# Patient Record
Sex: Female | Born: 1965 | Race: Black or African American | Hispanic: No | Marital: Single | Smoking: Former smoker
Health system: Southern US, Community
[De-identification: ages and names within clinical notes are randomized; demographics above are authoritative.]

## PROBLEM LIST (undated history)

## (undated) DIAGNOSIS — G4733 Obstructive sleep apnea (adult) (pediatric): Secondary | ICD-10-CM

## (undated) DIAGNOSIS — I517 Cardiomegaly: Secondary | ICD-10-CM

## (undated) DIAGNOSIS — M797 Fibromyalgia: Secondary | ICD-10-CM

## (undated) DIAGNOSIS — R0600 Dyspnea, unspecified: Secondary | ICD-10-CM

## (undated) DIAGNOSIS — I5032 Chronic diastolic (congestive) heart failure: Secondary | ICD-10-CM

## (undated) DIAGNOSIS — I10 Essential (primary) hypertension: Secondary | ICD-10-CM

## (undated) DIAGNOSIS — E119 Type 2 diabetes mellitus without complications: Secondary | ICD-10-CM

## (undated) DIAGNOSIS — F319 Bipolar disorder, unspecified: Secondary | ICD-10-CM

## (undated) DIAGNOSIS — J449 Chronic obstructive pulmonary disease, unspecified: Secondary | ICD-10-CM

## (undated) DIAGNOSIS — F419 Anxiety disorder, unspecified: Secondary | ICD-10-CM

## (undated) DIAGNOSIS — F32A Depression, unspecified: Secondary | ICD-10-CM

## (undated) DIAGNOSIS — N189 Chronic kidney disease, unspecified: Secondary | ICD-10-CM

## (undated) HISTORY — DX: Obstructive sleep apnea (adult) (pediatric): G47.33

## (undated) HISTORY — PX: NO PAST SURGERIES: SHX2092

## (undated) HISTORY — DX: Morbid (severe) obesity due to excess calories: E66.01

## (undated) HISTORY — DX: Chronic diastolic (congestive) heart failure: I50.32

---

## 1998-01-26 ENCOUNTER — Emergency Department (HOSPITAL_COMMUNITY): Admission: EM | Admit: 1998-01-26 | Discharge: 1998-01-26 | Payer: Self-pay | Admitting: Emergency Medicine

## 1998-01-26 ENCOUNTER — Encounter: Payer: Self-pay | Admitting: Emergency Medicine

## 2000-01-29 ENCOUNTER — Emergency Department (HOSPITAL_COMMUNITY): Admission: EM | Admit: 2000-01-29 | Discharge: 2000-01-29 | Payer: Self-pay

## 2001-07-25 ENCOUNTER — Ambulatory Visit (HOSPITAL_COMMUNITY): Admission: RE | Admit: 2001-07-25 | Discharge: 2001-07-25 | Payer: Self-pay | Admitting: Internal Medicine

## 2001-07-25 ENCOUNTER — Encounter: Payer: Self-pay | Admitting: Internal Medicine

## 2001-12-12 ENCOUNTER — Encounter: Payer: Self-pay | Admitting: Internal Medicine

## 2001-12-12 ENCOUNTER — Encounter: Admission: RE | Admit: 2001-12-12 | Discharge: 2001-12-12 | Payer: Self-pay | Admitting: Internal Medicine

## 2002-05-06 ENCOUNTER — Encounter: Payer: Self-pay | Admitting: Internal Medicine

## 2002-05-06 ENCOUNTER — Encounter: Admission: RE | Admit: 2002-05-06 | Discharge: 2002-05-06 | Payer: Self-pay | Admitting: Internal Medicine

## 2002-08-29 ENCOUNTER — Emergency Department (HOSPITAL_COMMUNITY): Admission: EM | Admit: 2002-08-29 | Discharge: 2002-08-29 | Payer: Self-pay | Admitting: Emergency Medicine

## 2002-10-28 ENCOUNTER — Encounter: Admission: RE | Admit: 2002-10-28 | Discharge: 2002-11-17 | Payer: Self-pay | Admitting: Internal Medicine

## 2003-03-26 ENCOUNTER — Encounter: Admission: RE | Admit: 2003-03-26 | Discharge: 2003-03-26 | Payer: Self-pay | Admitting: Internal Medicine

## 2003-06-05 ENCOUNTER — Emergency Department (HOSPITAL_COMMUNITY): Admission: EM | Admit: 2003-06-05 | Discharge: 2003-06-05 | Payer: Self-pay | Admitting: *Deleted

## 2003-07-23 ENCOUNTER — Ambulatory Visit (HOSPITAL_BASED_OUTPATIENT_CLINIC_OR_DEPARTMENT_OTHER): Admission: RE | Admit: 2003-07-23 | Discharge: 2003-07-23 | Payer: Self-pay | Admitting: Internal Medicine

## 2003-09-16 ENCOUNTER — Emergency Department (HOSPITAL_COMMUNITY): Admission: EM | Admit: 2003-09-16 | Discharge: 2003-09-16 | Payer: Self-pay | Admitting: Emergency Medicine

## 2005-01-29 ENCOUNTER — Emergency Department (HOSPITAL_COMMUNITY): Admission: EM | Admit: 2005-01-29 | Discharge: 2005-01-30 | Payer: Self-pay | Admitting: Emergency Medicine

## 2005-08-21 ENCOUNTER — Emergency Department (HOSPITAL_COMMUNITY): Admission: EM | Admit: 2005-08-21 | Discharge: 2005-08-21 | Payer: Self-pay | Admitting: Emergency Medicine

## 2005-10-09 ENCOUNTER — Emergency Department (HOSPITAL_COMMUNITY): Admission: EM | Admit: 2005-10-09 | Discharge: 2005-10-09 | Payer: Self-pay | Admitting: *Deleted

## 2005-11-27 ENCOUNTER — Inpatient Hospital Stay (HOSPITAL_COMMUNITY): Admission: AD | Admit: 2005-11-27 | Discharge: 2005-11-27 | Payer: Self-pay | Admitting: Obstetrics & Gynecology

## 2005-12-19 ENCOUNTER — Ambulatory Visit: Payer: Self-pay | Admitting: Nurse Practitioner

## 2005-12-22 ENCOUNTER — Ambulatory Visit: Payer: Self-pay | Admitting: *Deleted

## 2006-02-20 ENCOUNTER — Ambulatory Visit (HOSPITAL_COMMUNITY): Admission: RE | Admit: 2006-02-20 | Discharge: 2006-02-20 | Payer: Self-pay | Admitting: Family Medicine

## 2006-03-30 ENCOUNTER — Emergency Department (HOSPITAL_COMMUNITY): Admission: EM | Admit: 2006-03-30 | Discharge: 2006-03-30 | Payer: Self-pay | Admitting: Emergency Medicine

## 2006-04-26 ENCOUNTER — Emergency Department (HOSPITAL_COMMUNITY): Admission: EM | Admit: 2006-04-26 | Discharge: 2006-04-26 | Payer: Self-pay | Admitting: Emergency Medicine

## 2006-05-14 ENCOUNTER — Ambulatory Visit: Payer: Self-pay | Admitting: Nurse Practitioner

## 2006-05-24 ENCOUNTER — Ambulatory Visit: Payer: Self-pay | Admitting: Nurse Practitioner

## 2006-05-25 ENCOUNTER — Ambulatory Visit (HOSPITAL_COMMUNITY): Admission: RE | Admit: 2006-05-25 | Discharge: 2006-05-25 | Payer: Self-pay | Admitting: Nurse Practitioner

## 2006-05-25 ENCOUNTER — Ambulatory Visit: Payer: Self-pay | Admitting: Vascular Surgery

## 2006-05-25 ENCOUNTER — Encounter: Payer: Self-pay | Admitting: Vascular Surgery

## 2006-07-19 ENCOUNTER — Ambulatory Visit: Payer: Self-pay | Admitting: Internal Medicine

## 2006-08-07 ENCOUNTER — Ambulatory Visit (HOSPITAL_BASED_OUTPATIENT_CLINIC_OR_DEPARTMENT_OTHER): Admission: RE | Admit: 2006-08-07 | Discharge: 2006-08-07 | Payer: Self-pay | Admitting: Family Medicine

## 2006-08-12 ENCOUNTER — Ambulatory Visit: Payer: Self-pay | Admitting: Internal Medicine

## 2006-10-24 ENCOUNTER — Encounter (INDEPENDENT_AMBULATORY_CARE_PROVIDER_SITE_OTHER): Payer: Self-pay | Admitting: *Deleted

## 2006-11-13 ENCOUNTER — Ambulatory Visit: Payer: Self-pay | Admitting: Internal Medicine

## 2006-11-13 ENCOUNTER — Encounter (INDEPENDENT_AMBULATORY_CARE_PROVIDER_SITE_OTHER): Payer: Self-pay | Admitting: Nurse Practitioner

## 2006-11-13 LAB — CONVERTED CEMR LAB
ALT: 12 units/L (ref 0–35)
AST: 16 units/L (ref 0–37)
Albumin: 4 g/dL (ref 3.5–5.2)
Alkaline Phosphatase: 60 units/L (ref 39–117)
BUN: 10 mg/dL (ref 6–23)
CO2: 25 meq/L (ref 19–32)
Calcium: 9.1 mg/dL (ref 8.4–10.5)
Creatinine, Ser: 0.82 mg/dL (ref 0.40–1.20)
Glucose, Bld: 94 mg/dL (ref 70–99)
HCT: 40.8 % (ref 36.0–46.0)
Lymphocytes Relative: 36 % (ref 12–46)
MCV: 79.1 fL (ref 78.0–100.0)
Neutro Abs: 4.7 10*3/uL (ref 1.7–7.7)
Neutrophils Relative %: 56 % (ref 43–77)
RBC: 5.16 M/uL — ABNORMAL HIGH (ref 3.87–5.11)
TSH: 2.819 microintl units/mL (ref 0.350–5.50)
Total Bilirubin: 0.5 mg/dL (ref 0.3–1.2)
Total Protein: 7.4 g/dL (ref 6.0–8.3)
WBC: 8.4 10*3/uL (ref 4.0–10.5)

## 2006-11-19 ENCOUNTER — Encounter: Admission: RE | Admit: 2006-11-19 | Discharge: 2006-11-19 | Payer: Self-pay | Admitting: Nurse Practitioner

## 2006-12-31 ENCOUNTER — Encounter (INDEPENDENT_AMBULATORY_CARE_PROVIDER_SITE_OTHER): Payer: Self-pay | Admitting: Emergency Medicine

## 2006-12-31 ENCOUNTER — Emergency Department (HOSPITAL_COMMUNITY): Admission: EM | Admit: 2006-12-31 | Discharge: 2006-12-31 | Payer: Self-pay | Admitting: Emergency Medicine

## 2006-12-31 ENCOUNTER — Ambulatory Visit: Payer: Self-pay | Admitting: *Deleted

## 2007-01-07 ENCOUNTER — Ambulatory Visit: Payer: Self-pay | Admitting: Internal Medicine

## 2007-01-24 ENCOUNTER — Ambulatory Visit: Payer: Self-pay | Admitting: Internal Medicine

## 2007-01-28 ENCOUNTER — Encounter: Admission: RE | Admit: 2007-01-28 | Discharge: 2007-01-28 | Payer: Self-pay | Admitting: Internal Medicine

## 2007-02-15 ENCOUNTER — Ambulatory Visit: Payer: Self-pay | Admitting: Family Medicine

## 2007-03-04 ENCOUNTER — Ambulatory Visit (HOSPITAL_COMMUNITY): Admission: RE | Admit: 2007-03-04 | Discharge: 2007-03-04 | Payer: Self-pay | Admitting: Family Medicine

## 2007-06-18 ENCOUNTER — Ambulatory Visit: Payer: Self-pay | Admitting: Internal Medicine

## 2007-07-24 ENCOUNTER — Emergency Department (HOSPITAL_COMMUNITY): Admission: EM | Admit: 2007-07-24 | Discharge: 2007-07-24 | Payer: Self-pay | Admitting: Emergency Medicine

## 2007-08-19 ENCOUNTER — Ambulatory Visit: Payer: Self-pay | Admitting: Internal Medicine

## 2007-10-30 ENCOUNTER — Ambulatory Visit: Payer: Self-pay | Admitting: Internal Medicine

## 2008-03-04 ENCOUNTER — Ambulatory Visit: Payer: Self-pay | Admitting: Internal Medicine

## 2008-03-04 LAB — CONVERTED CEMR LAB
Chloride: 99 meq/L (ref 96–112)
Cholesterol: 152 mg/dL (ref 0–200)
Potassium: 3.7 meq/L (ref 3.5–5.3)
Sodium: 140 meq/L (ref 135–145)
TSH: 1.862 microintl units/mL (ref 0.350–4.50)

## 2008-03-20 ENCOUNTER — Ambulatory Visit (HOSPITAL_COMMUNITY): Admission: RE | Admit: 2008-03-20 | Discharge: 2008-03-20 | Payer: Self-pay | Admitting: Internal Medicine

## 2008-05-07 ENCOUNTER — Ambulatory Visit: Payer: Self-pay | Admitting: Internal Medicine

## 2008-06-18 ENCOUNTER — Ambulatory Visit: Payer: Self-pay | Admitting: Internal Medicine

## 2008-06-18 ENCOUNTER — Ambulatory Visit: Payer: Self-pay | Admitting: *Deleted

## 2008-06-18 ENCOUNTER — Encounter: Payer: Self-pay | Admitting: Internal Medicine

## 2008-08-18 ENCOUNTER — Ambulatory Visit: Payer: Self-pay | Admitting: Internal Medicine

## 2008-09-14 ENCOUNTER — Telehealth (INDEPENDENT_AMBULATORY_CARE_PROVIDER_SITE_OTHER): Payer: Self-pay | Admitting: *Deleted

## 2008-09-14 ENCOUNTER — Ambulatory Visit: Payer: Self-pay | Admitting: Internal Medicine

## 2008-09-14 LAB — CONVERTED CEMR LAB
Chlamydia, Swab/Urine, PCR: NEGATIVE
GC Probe Amp, Urine: NEGATIVE

## 2008-09-24 ENCOUNTER — Telehealth (INDEPENDENT_AMBULATORY_CARE_PROVIDER_SITE_OTHER): Payer: Self-pay | Admitting: *Deleted

## 2008-11-09 ENCOUNTER — Ambulatory Visit: Payer: Self-pay | Admitting: Internal Medicine

## 2008-11-09 LAB — CONVERTED CEMR LAB
Chloride: 102 meq/L (ref 96–112)
Glucose, Bld: 100 mg/dL — ABNORMAL HIGH (ref 70–99)

## 2008-11-19 ENCOUNTER — Ambulatory Visit: Payer: Self-pay | Admitting: Internal Medicine

## 2008-11-20 ENCOUNTER — Encounter (INDEPENDENT_AMBULATORY_CARE_PROVIDER_SITE_OTHER): Payer: Self-pay | Admitting: Internal Medicine

## 2008-12-07 ENCOUNTER — Ambulatory Visit: Payer: Self-pay | Admitting: Internal Medicine

## 2009-01-26 ENCOUNTER — Ambulatory Visit: Payer: Self-pay | Admitting: Internal Medicine

## 2009-02-09 ENCOUNTER — Ambulatory Visit: Payer: Self-pay | Admitting: Internal Medicine

## 2009-03-03 ENCOUNTER — Inpatient Hospital Stay (HOSPITAL_COMMUNITY): Admission: AD | Admit: 2009-03-03 | Discharge: 2009-03-03 | Payer: Self-pay | Admitting: Obstetrics & Gynecology

## 2009-05-03 ENCOUNTER — Emergency Department (HOSPITAL_COMMUNITY): Admission: EM | Admit: 2009-05-03 | Discharge: 2009-05-03 | Payer: Self-pay | Admitting: Emergency Medicine

## 2009-06-02 ENCOUNTER — Emergency Department (HOSPITAL_COMMUNITY): Admission: EM | Admit: 2009-06-02 | Discharge: 2009-06-02 | Payer: Self-pay | Admitting: Emergency Medicine

## 2009-09-02 ENCOUNTER — Ambulatory Visit: Payer: Self-pay | Admitting: Internal Medicine

## 2009-09-28 ENCOUNTER — Ambulatory Visit: Payer: Self-pay | Admitting: Internal Medicine

## 2009-12-01 ENCOUNTER — Encounter (INDEPENDENT_AMBULATORY_CARE_PROVIDER_SITE_OTHER): Payer: Self-pay | Admitting: Internal Medicine

## 2009-12-01 ENCOUNTER — Ambulatory Visit: Payer: Self-pay | Admitting: Internal Medicine

## 2009-12-01 LAB — CONVERTED CEMR LAB
Chlamydia, Swab/Urine, PCR: NEGATIVE
GC Probe Amp, Urine: NEGATIVE
HIV: NONREACTIVE

## 2009-12-29 ENCOUNTER — Emergency Department (HOSPITAL_COMMUNITY): Admission: EM | Admit: 2009-12-29 | Discharge: 2009-12-29 | Payer: Self-pay | Admitting: Emergency Medicine

## 2010-02-27 ENCOUNTER — Encounter: Payer: Self-pay | Admitting: Internal Medicine

## 2010-04-24 LAB — CBC
HCT: 36 % (ref 36.0–46.0)
Hemoglobin: 11.4 g/dL — ABNORMAL LOW (ref 12.0–15.0)
MCV: 80.7 fL (ref 78.0–100.0)
Platelets: 343 10*3/uL (ref 150–400)
RBC: 4.46 MIL/uL (ref 3.87–5.11)
WBC: 6.7 10*3/uL (ref 4.0–10.5)

## 2010-04-24 LAB — WET PREP, GENITAL: Clue Cells Wet Prep HPF POC: NONE SEEN

## 2010-04-24 LAB — URINALYSIS, ROUTINE W REFLEX MICROSCOPIC
Bilirubin Urine: NEGATIVE
Nitrite: NEGATIVE
Urobilinogen, UA: 0.2 mg/dL (ref 0.0–1.0)

## 2010-04-24 LAB — URINE MICROSCOPIC-ADD ON

## 2010-04-24 LAB — POCT PREGNANCY, URINE: Preg Test, Ur: NEGATIVE

## 2010-06-21 NOTE — Procedures (Signed)
NAME:  Jenna Chandler, Jenna Chandler NO.:  [id number]   MEDICAL RECORD NO.:  [id number]          PATIENT TYPE:  OUT   LOCATION:  SLEEP CENTER                 FACILITY:  [facility]   PHYSICIAN:  Clinton D. Maple Hudson, MD, FCCP, FACPDATE OF BIRTH:  24-Mar-1963   DATE OF STUDY:  08/07/2006                            NOCTURNAL POLYSOMNOGRAM   REFERRING PHYSICIAN:  Maurice March, M.D.   INDICATION FOR STUDY:  Hypersomnia with sleep apnea.   EPWORTH SLEEPINESS SCORE:  17/24, BMI 54.8, weight 330 pounds.   MEDICATIONS:  Home medications are listed and reviewed.   A previous study on July 23, 2003, had recorded an AHI of 71 per hour.  Split protocol CPAP titration then to 21 CWP gave an AHI of 1.3 per  hour.   SLEEP ARCHITECTURE:  Total sleep time 333 minutes with sleep efficiency  83%.  Stage I was 8%, stage 2 67%, stages III and IV absent, REM 24% of  total sleep time.  Sleep latency 28 minutes, REM latency 126 minutes,  awake after sleep onset 43 minutes, arousal index 7.6.  Bedtime  medications included Rozerem, carisoprodol and Tylenol, all taken at  9:30 p.m.   RESPIRATORY DATA:  Split study protocol.  Apnea-hypopnea index (AHI,  RDI) 73.9 obstructive events per hour, indicating severe obstructive  sleep apnea/hypopnea syndrome before CPAP.  There were 71 obstructive  apneas and 83 hypopneas before CPAP.  Events were not positional,  although most sleep time was spent on her left side.  REM AHI 20.9 per  hour.  CPAP was titrated to 18 CWP, AHI 0 per hour.  A large Respironics  Comfort full mask was used with a heated humidifier.   OXYGEN DATA:  Loud snoring before CPAP with oxygen desaturation to a  nadir of 66%.  After CPAP control, saturation held 93-95% on room air.   CARDIAC DATA:  Normal sinus rhythm.   MOVEMENT-PARASOMNIA:  Occasional limb jerk, insignificant effect on  sleep.  Bathroom x1.   IMPRESSIONS-RECOMMENDATIONS:  1. Severe obstructive sleep apnea/hypopnea  syndrome, apnea-hypopnea      index 73.9 per hour with nonpositional events, loud snoring and      oxygen desaturation to a nadir of 66%.  2. Successful continuous positive airway pressure titration to 18      centimeters of water pressure, apnea-hypopnea index 0 per hour.  A      large Respironics Comfort full mask was used with heated      humidifier.      Clinton D. Maple Hudson, MD, [facility], FACP  Diplomate, Biomedical engineer of Sleep Medicine  Electronically Signed     CDY/MEDQ  D:  08/12/2006 13:34:17  T:  08/12/2006 04:54:09  Job:  [id number]

## 2010-08-31 ENCOUNTER — Emergency Department (HOSPITAL_COMMUNITY)
Admission: EM | Admit: 2010-08-31 | Discharge: 2010-08-31 | Disposition: A | Discharge status: Home / Self Care | Payer: Self-pay | Attending: Emergency Medicine | Admitting: Emergency Medicine

## 2010-08-31 ENCOUNTER — Emergency Department (HOSPITAL_COMMUNITY): Payer: Self-pay

## 2010-08-31 DIAGNOSIS — R079 Chest pain, unspecified: Secondary | ICD-10-CM | POA: Insufficient documentation

## 2010-08-31 DIAGNOSIS — I10 Essential (primary) hypertension: Secondary | ICD-10-CM | POA: Insufficient documentation

## 2010-08-31 LAB — BASIC METABOLIC PANEL
BUN: 7 mg/dL (ref 6–23)
Creatinine, Ser: 0.64 mg/dL (ref 0.50–1.10)

## 2010-08-31 LAB — CBC
MCH: 23.2 pg — ABNORMAL LOW (ref 26.0–34.0)
RBC: 4.61 MIL/uL (ref 3.87–5.11)
WBC: 8.3 10*3/uL (ref 4.0–10.5)

## 2010-10-26 ENCOUNTER — Other Ambulatory Visit: Payer: Self-pay | Admitting: Family Medicine

## 2010-11-11 ENCOUNTER — Other Ambulatory Visit (HOSPITAL_COMMUNITY): Payer: Self-pay | Admitting: Family Medicine

## 2010-11-11 DIAGNOSIS — Z1231 Encounter for screening mammogram for malignant neoplasm of breast: Secondary | ICD-10-CM

## 2010-11-15 ENCOUNTER — Ambulatory Visit (HOSPITAL_COMMUNITY): Payer: Self-pay

## 2010-11-23 ENCOUNTER — Ambulatory Visit (HOSPITAL_COMMUNITY): Payer: Self-pay | Attending: Family Medicine

## 2011-02-01 ENCOUNTER — Emergency Department (HOSPITAL_COMMUNITY): Payer: Self-pay

## 2011-02-01 ENCOUNTER — Emergency Department (HOSPITAL_COMMUNITY)
Admission: EM | Admit: 2011-02-01 | Discharge: 2011-02-01 | Disposition: A | Discharge status: Home / Self Care | Payer: Self-pay | Attending: Emergency Medicine | Admitting: Emergency Medicine

## 2011-02-01 ENCOUNTER — Encounter: Payer: Self-pay | Admitting: *Deleted

## 2011-02-01 DIAGNOSIS — R0609 Other forms of dyspnea: Secondary | ICD-10-CM | POA: Insufficient documentation

## 2011-02-01 DIAGNOSIS — R0602 Shortness of breath: Secondary | ICD-10-CM | POA: Insufficient documentation

## 2011-02-01 DIAGNOSIS — R0989 Other specified symptoms and signs involving the circulatory and respiratory systems: Secondary | ICD-10-CM | POA: Insufficient documentation

## 2011-02-01 DIAGNOSIS — J449 Chronic obstructive pulmonary disease, unspecified: Secondary | ICD-10-CM | POA: Insufficient documentation

## 2011-02-01 DIAGNOSIS — I10 Essential (primary) hypertension: Secondary | ICD-10-CM | POA: Insufficient documentation

## 2011-02-01 DIAGNOSIS — Z79899 Other long term (current) drug therapy: Secondary | ICD-10-CM | POA: Insufficient documentation

## 2011-02-01 DIAGNOSIS — R059 Cough, unspecified: Secondary | ICD-10-CM | POA: Insufficient documentation

## 2011-02-01 DIAGNOSIS — R05 Cough: Secondary | ICD-10-CM | POA: Insufficient documentation

## 2011-02-01 DIAGNOSIS — J3489 Other specified disorders of nose and nasal sinuses: Secondary | ICD-10-CM | POA: Insufficient documentation

## 2011-02-01 DIAGNOSIS — J45901 Unspecified asthma with (acute) exacerbation: Secondary | ICD-10-CM | POA: Insufficient documentation

## 2011-02-01 DIAGNOSIS — J4489 Other specified chronic obstructive pulmonary disease: Secondary | ICD-10-CM | POA: Insufficient documentation

## 2011-02-01 HISTORY — DX: Essential (primary) hypertension: I10

## 2011-02-01 HISTORY — DX: Chronic obstructive pulmonary disease, unspecified: J44.9

## 2011-02-01 LAB — CBC
MCV: 78.1 fL (ref 78.0–100.0)
Platelets: 349 10*3/uL (ref 150–400)
RBC: 4.75 MIL/uL (ref 3.87–5.11)
RDW: 15.8 % — ABNORMAL HIGH (ref 11.5–15.5)
WBC: 8.5 10*3/uL (ref 4.0–10.5)

## 2011-02-01 LAB — BASIC METABOLIC PANEL
CO2: 27 mEq/L (ref 19–32)
Calcium: 8.9 mg/dL (ref 8.4–10.5)
Chloride: 100 mEq/L (ref 96–112)
Creatinine, Ser: 0.61 mg/dL (ref 0.50–1.10)
GFR calc Af Amer: >90 mL/min (ref >90)
Sodium: 136 mEq/L (ref 135–145)

## 2011-02-01 MED ORDER — PREDNISONE 20 MG PO TABS
60.0000 mg | ORAL_TABLET | Freq: Once | ORAL | Status: AC
  Administered 2011-02-01: 60 mg via ORAL
  Filled 2011-02-01: qty 3

## 2011-02-01 MED ORDER — IPRATROPIUM BROMIDE 0.02 % IN SOLN
0.5000 mg | Freq: Once | RESPIRATORY_TRACT | Status: AC
  Administered 2011-02-01: 0.5 mg via RESPIRATORY_TRACT
  Filled 2011-02-01: qty 2.5

## 2011-02-01 MED ORDER — AMOXICILLIN 500 MG PO CAPS: 500.0000 mg | ORAL_CAPSULE | Freq: Three times a day (TID) | ORAL | Status: AC

## 2011-02-01 MED ORDER — ALBUTEROL SULFATE (5 MG/ML) 0.5% IN NEBU
5.0000 mg | INHALATION_SOLUTION | Freq: Once | RESPIRATORY_TRACT | Status: AC
  Administered 2011-02-01: 5 mg via RESPIRATORY_TRACT
  Filled 2011-02-01: qty 1

## 2011-02-01 MED ORDER — PREDNISONE 10 MG PO TABS: 20.0000 mg | ORAL_TABLET | Freq: Every day | ORAL | Status: DC

## 2011-02-01 MED ORDER — ALBUTEROL SULFATE (5 MG/ML) 0.5% IN NEBU
5.0000 mg | INHALATION_SOLUTION | Freq: Once | RESPIRATORY_TRACT | Status: AC
  Administered 2011-02-01: 5 mg via RESPIRATORY_TRACT

## 2011-02-01 MED ORDER — ALBUTEROL SULFATE (5 MG/ML) 0.5% IN NEBU
10.0000 mg | INHALATION_SOLUTION | Freq: Once | RESPIRATORY_TRACT | Status: AC
  Administered 2011-02-01: 10 mg via RESPIRATORY_TRACT
  Filled 2011-02-01: qty 2

## 2011-02-01 NOTE — ED Provider Notes (Signed)
Medical screening  examination/treatment/procedure(s) were conducted as a shared visit with non-physician practitioner(s) and myself.  I personally evaluated the patient during the encounter A 45-year-old, female, with history of asthma, presents to emergency department with increasing nonproductive cough, and wheezing.  Her symptoms became about 5 days ago and have slowly progressed.  She has not had a fever.  Her cough is nonproductive.  She got and expiratory wheezes.  After several nebs in the emergency department.  We will continue to treat her wheezing and eventually, let her go home.  He does not show any signs of pneumonia.  Nicholes Stairs, MD 02/01/11 1024

## 2011-02-01 NOTE — ED Provider Notes (Signed)
History     CSN: [id number]  Arrival date & time 02/01/11  4098   First MD Initiated Contact with Patient 02/01/11 0912      Chief Complaint  Patient presents with  . Shortness of Breath    (Consider location/radiation/quality/duration/timing/severity/associated sxs/prior treatment) The history is provided by the patient.    Pt presents to the ED with weakness, shortness or breath and asthma. She states that she has progressively been getting wrose over the past week and "put off coming in for too long". The patient is in moderate distress upon arrival and can not complete full sentences. She denies fevers, but states she has been having nasal congestion with cough and sinus pressure for the past week as well.   Past Medical History  Diagnosis Date  . Asthma   . Hypertension   . COPD (chronic obstructive pulmonary disease)     No past surgical history on file.  No family history on file.  History  Substance Use Topics  . Smoking status: Never Smoker   . Smokeless tobacco: Not on file  . Alcohol Use: No    OB History    Grav Para Term Preterm Abortions TAB SAB Ect Mult Living                  Review of Systems  All other systems reviewed and are negative.    Allergies  Review of patient's allergies indicates no known allergies.  Home Medications   Current Outpatient Rx  Name Route Sig Dispense Refill  . ALBUTEROL SULFATE HFA 108 (90 BASE) MCG/ACT IN AERS Inhalation Inhale 2 puffs into the lungs every 6 (six) hours as needed. wheezing     . AMLODIPINE BESYLATE 5 MG PO TABS Oral Take 5 mg by mouth daily.      . BECLOMETHASONE DIPROPIONATE 80 MCG/ACT IN AERS Inhalation Inhale 1 puff into the lungs 2 (two) times daily.      Marland Kitchen DIPHENHYDRAMINE HCL 25 MG PO TABS Oral Take 25 mg by mouth every 6 (six) hours as needed. COLD SYMPTOMS     . LISINOPRIL-HYDROCHLOROTHIAZIDE 10-12.5 MG PO TABS Oral Take 1 tablet by mouth daily.      . MULTI-VITAMIN/MINERALS PO TABS Oral  Take 1 tablet by mouth daily.      Marland Kitchen PHENYLEPHRINE-PHENIRAMINE-DM 11-26-18 MG PO PACK Oral Take 1 packet by mouth 2 (two) times daily as needed. FLU SYMPTOMS     . PSEUDOEPH-DOXYLAMINE-DM-APAP 60-12.07-05-998 MG/30ML PO LIQD Oral Take 15 mLs by mouth at bedtime as needed. COLD,COUGH,SLEEP     . AMOXICILLIN 500 MG PO CAPS Oral Take 1 capsule (500 mg total) by mouth 3 (three) times daily. 21 capsule 0  . PREDNISONE 10 MG PO TABS Oral Take 2 tablets (20 mg total) by mouth daily. 15 tablet 0    BP 157/78  Pulse 84  Temp(Src) 98.8 F (37.1 C) (Oral)  Resp 22  SpO2 95%  LMP 01/16/2011  Physical Exam  Nursing note and vitals reviewed. Constitutional: She is oriented to person, place, and time. She appears well-developed and well-nourished.  HENT:  Head: Normocephalic and atraumatic.  Eyes: Conjunctivae are normal. Pupils are equal, round, and reactive to light.  Neck: Trachea normal, normal range of motion and full passive range of motion without pain. Neck supple.  Cardiovascular: Normal rate, regular rhythm and normal pulses.   Pulmonary/Chest: Effort normal. Respiratory distress: pt is in mild respiratory distress.respiratory effort is increased. Unable to appreciate retractions due to patients obesity.  She has wheezes. She has no rales. Chest wall is not dull to percussion. She exhibits no tenderness, no crepitus, no edema, no deformity and no retraction.  Abdominal: Soft. Normal appearance and bowel sounds are normal. She exhibits no distension and no mass. There is no tenderness. There is no rebound and no guarding.  Musculoskeletal: Normal range of motion.  Neurological: She is oriented to person, place, and time. She has normal strength.  Skin: Skin is warm, dry and intact.  Psychiatric: Her speech is normal. Cognition and memory are normal.    ED Course  Procedures (including critical care time)  Labs Reviewed  CBC - Abnormal; Notable for the following:    Hemoglobin 11.1 (*)      MCH 23.4 (*)    MCHC 29.9 (*)    RDW 15.8 (*)    All other components within normal limits  BASIC METABOLIC PANEL   Dg Chest Port 1 View  02/01/2011  *RADIOLOGY REPORT*  Clinical Data: Shortness of breath, wheezing, history asthma, COPD  PORTABLE CHEST - 1 VIEW  Comparison: Portable exam 0940 hours compared to 08/31/2010  Findings: Upper-normal size of cardiac silhouette. Prominent pulmonary vascular markings, likely accentuated by hypoinflation. Peribronchial thickening without infiltrate or effusion. No pneumothorax. Bones unremarkable.  IMPRESSION: Chronic peribronchial thickening. Decreased lung volumes with crowding of markings. No acute infiltrate.  Original Report Authenticated By: Lollie Marrow, M.D.     1. Asthma exacerbation       MDM  Pt examined and discussed with Dr. Weldon Inches.  Throughout patients stay in the ER her pulse ox remained above 95 on room air. After 3 albuterol treatments the patients lungs sounds were much improved and she said that she feels much better. Before discharging the patient is speaking in complete sentences.      Dorthula Matas, PA 02/01/11 1225

## 2011-02-01 NOTE — ED Notes (Signed)
Pt presents with shortness of breath, states "I was trying to make it but the albuterol is not helping"

## 2011-02-01 NOTE — ED Provider Notes (Signed)
Medical screening examination/treatment/procedure(s) were conducted as a shared visit with non-physician practitioner(s) and myself.  I personally evaluated the patient during the encounter  Nicholes Stairs, MD 02/01/11 [phone]

## 2011-02-20 ENCOUNTER — Telehealth: Payer: Self-pay | Admitting: Oncology

## 2011-02-20 NOTE — Telephone Encounter (Signed)
pts daughter Deetta Siegmann call to scheduled her appts .  provided appt for 04/06/2011

## 2011-02-20 NOTE — Telephone Encounter (Signed)
also infiormed daughter of mammogram appt for 03/30/2011 @ 2pm @ [facility]

## 2011-02-26 ENCOUNTER — Emergency Department (HOSPITAL_COMMUNITY): Payer: Self-pay

## 2011-02-26 ENCOUNTER — Emergency Department (HOSPITAL_COMMUNITY)
Admission: EM | Admit: 2011-02-26 | Discharge: 2011-02-26 | Disposition: A | Discharge status: Home / Self Care | Payer: Self-pay | Attending: Emergency Medicine | Admitting: Emergency Medicine

## 2011-02-26 ENCOUNTER — Other Ambulatory Visit: Payer: Self-pay

## 2011-02-26 ENCOUNTER — Encounter (HOSPITAL_COMMUNITY): Payer: Self-pay

## 2011-02-26 DIAGNOSIS — I498 Other specified cardiac arrhythmias: Secondary | ICD-10-CM | POA: Insufficient documentation

## 2011-02-26 DIAGNOSIS — J45901 Unspecified asthma with (acute) exacerbation: Secondary | ICD-10-CM | POA: Insufficient documentation

## 2011-02-26 DIAGNOSIS — I10 Essential (primary) hypertension: Secondary | ICD-10-CM | POA: Insufficient documentation

## 2011-02-26 DIAGNOSIS — E669 Obesity, unspecified: Secondary | ICD-10-CM | POA: Insufficient documentation

## 2011-02-26 DIAGNOSIS — Z6841 Body Mass Index (BMI) 40.0 and over, adult: Secondary | ICD-10-CM | POA: Insufficient documentation

## 2011-02-26 LAB — POCT I-STAT, CHEM 8
Calcium, Ion: 1.13 mmol/L (ref 1.12–1.32)
Chloride: 105 mEq/L (ref 96–112)
Glucose, Bld: 99 mg/dL (ref 70–99)
HCT: 37 % (ref 36.0–46.0)
Hemoglobin: 12.6 g/dL (ref 12.0–15.0)
Potassium: 3.7 mEq/L (ref 3.5–5.1)

## 2011-02-26 LAB — POCT I-STAT TROPONIN I: Troponin i, poc: 0.02 ng/mL (ref 0.00–0.08)

## 2011-02-26 MED ORDER — IPRATROPIUM BROMIDE 0.02 % IN SOLN
1.0000 mg | Freq: Once | RESPIRATORY_TRACT | Status: AC
  Administered 2011-02-26: 1 mg via RESPIRATORY_TRACT
  Filled 2011-02-26: qty 5

## 2011-02-26 MED ORDER — ALBUTEROL SULFATE (5 MG/ML) 0.5% IN NEBU
10.0000 mg | INHALATION_SOLUTION | Freq: Once | RESPIRATORY_TRACT | Status: AC
  Administered 2011-02-26: 10 mg via RESPIRATORY_TRACT
  Filled 2011-02-26: qty 2

## 2011-02-26 MED ORDER — PREDNISONE 20 MG PO TABS: 20.0000 mg | ORAL_TABLET | Freq: Every day | ORAL | Status: AC

## 2011-02-26 MED ORDER — ALBUTEROL SULFATE HFA 108 (90 BASE) MCG/ACT IN AERS
2.0000 | INHALATION_SPRAY | RESPIRATORY_TRACT | Status: AC
  Administered 2011-02-26: 2 via RESPIRATORY_TRACT
  Filled 2011-02-26: qty 6.7

## 2011-02-26 MED ORDER — PREDNISONE 20 MG PO TABS
60.0000 mg | ORAL_TABLET | Freq: Once | ORAL | Status: AC
  Administered 2011-02-26: 60 mg via ORAL
  Filled 2011-02-26: qty 3

## 2011-02-26 NOTE — ED Notes (Signed)
Family at bedside. Patient states she is no longer in pain and ready to go home. Patient does not need anything at this time.

## 2011-02-26 NOTE — ED Notes (Signed)
Patient ambulated. O2 level maintained at 92%

## 2011-02-26 NOTE — ED Notes (Signed)
Pt states she has been having pain under her breast and right upper quad x three weeks. States she was tx at [facility] for the same. States her asthma is acting up now making making the pain worse

## 2011-02-26 NOTE — ED Provider Notes (Signed)
History     CSN: [id number]  Arrival date & time 02/26/11  1409   Chief Complaint  Patient presents with  . Shortness of Breath    HPI Pt was seen at 1540.  Per pt, c/o gradual onset and persistence of constant cough, SOB, and "wheezing" that began this morning.  Pt states her bilat lower ribs "hurt from coughing" for the past 3 weeks.  Pt was eval at [facility] ED for same, dx asthma, tx MDI and prednisone with improvement.  Pt describes her symptoms today as "my asthma is acting up."  States she used her MDI today without relief of symptoms.  Denies palpitations, no fevers, no back pain, no abd pain, no N/V/D, no rash.    Past Medical History  Diagnosis Date  . Asthma   . Hypertension   . COPD (chronic obstructive pulmonary disease)   . Obesity     History reviewed. No pertinent past surgical history.   History  Substance Use Topics  . Smoking status: Never Smoker   . Smokeless tobacco: Not on file  . Alcohol Use: No   Review of Systems ROS: Statement: All systems negative except as marked or noted in the HPI; Constitutional: Negative for fever and chills. ; ; Eyes: Negative for eye pain, redness and discharge. ; ; ENMT: Negative for ear pain, hoarseness, nasal congestion, sinus pressure and sore throat. ; ; Cardiovascular: Negative for chest pain, palpitations, diaphoresis, and peripheral edema. ; ; Respiratory: +cough, wheezing, SOB, and negative for stridor. ; ; Gastrointestinal: Negative for nausea, vomiting, diarrhea, abdominal pain, blood in stool, hematemesis, jaundice and rectal bleeding. . ; ; Genitourinary: Negative for dysuria, flank pain and hematuria. ; ; Musculoskeletal: Negative for back pain and neck pain. Negative for swelling and trauma.; ; Skin: Negative for pruritus, rash, abrasions, blisters, bruising and skin lesion.; ; Neuro: Negative for headache, lightheadedness and neck stiffness. Negative for weakness, altered level of consciousness , altered mental status,  extremity weakness, paresthesias, involuntary movement, seizure and syncope.     Allergies  Review of patient's allergies indicates no known allergies.  Home Medications   Current Outpatient Rx  Name Route Sig Dispense Refill  . ALBUTEROL SULFATE HFA 108 (90 BASE) MCG/ACT IN AERS Inhalation Inhale 2 puffs into the lungs every 6 (six) hours as needed. wheezing     . AMLODIPINE BESYLATE 5 MG PO TABS Oral Take 5 mg by mouth daily.      . BECLOMETHASONE DIPROPIONATE 80 MCG/ACT IN AERS Inhalation Inhale 1 puff into the lungs 2 (two) times daily.      . CHLORPHENIRAMINE-ACETAMINOPHEN 2-325 MG PO TABS Oral Take 2 tablets by mouth daily.    Marland Kitchen LISINOPRIL-HYDROCHLOROTHIAZIDE 10-12.5 MG PO TABS Oral Take 1 tablet by mouth daily.      . MULTI-VITAMIN/MINERALS PO TABS Oral Take 1 tablet by mouth daily.      Marland Kitchen PREDNISONE 10 MG PO TABS Oral Take 2 tablets (20 mg total) by mouth daily. 15 tablet 0    BP 152/106  Pulse 103  Temp(Src) 98.3 F (36.8 C) (Oral)  Resp 24  Ht 5\' 5"  (1.651 m)  Wt 400 lb (181.439 kg)  BMI 66.56 kg/m2  SpO2 91%  LMP 01/16/2011  Physical Exam 1545: Physical examination:  Nursing notes reviewed; Vital signs and O2 SAT reviewed;  Constitutional: Well developed, Well nourished, Well hydrated, In no acute distress; Head:  Normocephalic, atraumatic; Eyes: EOMI, PERRL, No scleral icterus; ENMT: Mouth and pharynx normal, Mucous membranes moist;  Neck: Supple, Full range of motion, No lymphadenopathy; Cardiovascular: Regular rate and rhythm, No murmur or gallop; Respiratory: Breath sounds coarse with insp and exp wheezes bilat, +occas audible wheezing, speaking in full sentences. Normal respiratory effort/excursion; Chest: +lower bilat anterior chest wall/ribs tender to palp, Movement normal; Abdomen: Soft, Nontender, Nondistended, Normal bowel sounds; Genitourinary: No CVA tenderness; Extremities: Pulses normal, No tenderness, No edema, No calf edema or asymmetry.; Neuro: AA&Ox3, Major  CN grossly intact.  No gross focal motor or sensory deficits in extremities.; Skin: Color normal, Warm, Dry, no rash.    ED Course  Procedures    MDM  MDM Reviewed: nursing note, vitals and previous chart Reviewed previous: ECG Interpretation: labs, ECG and x-ray Total time providing critical care: 30-74 minutes. This excludes time spent performing separately reportable procedures and services.   CRITICAL CARE Performed by: Laray Anger Total critical care time: 31 Critical care time was exclusive of separately billable procedures and treating other patients. Critical care was necessary to treat or prevent imminent or life-threatening deterioration. Critical care was time spent personally by me on the following activities: development of treatment plan with patient and/or surrogate as well as nursing, discussions with consultants, evaluation of patient's response to treatment, examination of patient, obtaining history from patient or surrogate, ordering and performing treatments and interventions, ordering and review of laboratory studies, ordering and review of radiographic studies, pulse oximetry and re-evaluation of patient's condition.    Date: 02/26/2011  Rate: 104  Rhythm: sinus tachycardia  QRS Axis: normal  Intervals: normal  ST/T Wave abnormalities: normal  Conduction Disutrbances:none  Narrative Interpretation:   Old EKG Reviewed: unchanged; no significant changes from previous EKG dated 08/31/2010.   Results for orders placed during the hospital encounter of 02/26/11  POCT I-STAT TROPONIN I      Component Value Range   Troponin i, poc 0.02  0.00 - 0.08 (ng/mL)   Comment 3           POCT I-STAT, CHEM 8      Component Value Range   Sodium 141  135 - 145 (mEq/L)   Potassium 3.7  3.5 - 5.1 (mEq/L)   Chloride 105  96 - 112 (mEq/L)   BUN 5 (*) 6 - 23 (mg/dL)   Creatinine, Ser 4.09  0.50 - 1.10 (mg/dL)   Glucose, Bld 99  70 - 99 (mg/dL)   Calcium, Ion 8.11   1.12 - 1.32 (mmol/L)   TCO2 27  0 - 100 (mmol/L)   Hemoglobin 12.6  12.0 - 15.0 (g/dL)   HCT 91.4  78.2 - 95.6 (%)    Dg Chest 2 View 02/26/2011  *RADIOLOGY REPORT*  Clinical Data: Cough and congestion  CHEST - 2 VIEW  Comparison: 02/01/2011  Findings: Cardiac leads overlie the chest.  Heart size is normal. Lung volumes are low but clear.  No pleural effusion.  No acute osseous finding.  IMPRESSION: Low lung volumes without focal acute finding.  Original Report Authenticated By: Harrel Lemon, M.D.    8:09 PM:  States she feels "much better now" after hour long neb and prednisone.  Has been ambulatory around ED with easy resps, steady gait, NAD, Sats 97% R/A at rest/92% R/A with ambulation.  Wants to go home now.  States she needs another inhaler, will dose MDI here.  Dx testing d/w pt and family.  Questions answered.  Verb understanding, agreeable to d/c home with outpt f/u.        [facility] M  Laray Anger, DO 02/28/11 0002

## 2011-04-06 ENCOUNTER — Other Ambulatory Visit: Payer: Self-pay

## 2011-04-06 ENCOUNTER — Ambulatory Visit: Payer: Self-pay | Admitting: Oncology

## 2011-05-11 ENCOUNTER — Encounter (HOSPITAL_COMMUNITY): Payer: Self-pay | Admitting: *Deleted

## 2011-05-11 ENCOUNTER — Emergency Department (HOSPITAL_COMMUNITY): Payer: Medicaid Other

## 2011-05-11 ENCOUNTER — Inpatient Hospital Stay (HOSPITAL_COMMUNITY)
Admission: EM | Admit: 2011-05-11 | Discharge: 2011-05-18 | DRG: 191 | Disposition: A | Discharge status: Home / Self Care | Payer: Medicaid Other | Attending: Internal Medicine | Admitting: Internal Medicine

## 2011-05-11 DIAGNOSIS — J449 Chronic obstructive pulmonary disease, unspecified: Secondary | ICD-10-CM | POA: Diagnosis present

## 2011-05-11 DIAGNOSIS — J810 Acute pulmonary edema: Secondary | ICD-10-CM

## 2011-05-11 DIAGNOSIS — J811 Chronic pulmonary edema: Secondary | ICD-10-CM | POA: Diagnosis present

## 2011-05-11 DIAGNOSIS — I509 Heart failure, unspecified: Secondary | ICD-10-CM

## 2011-05-11 DIAGNOSIS — Z6841 Body Mass Index (BMI) 40.0 and over, adult: Secondary | ICD-10-CM

## 2011-05-11 DIAGNOSIS — J441 Chronic obstructive pulmonary disease with (acute) exacerbation: Principal | ICD-10-CM | POA: Diagnosis present

## 2011-05-11 DIAGNOSIS — R0603 Acute respiratory distress: Secondary | ICD-10-CM

## 2011-05-11 DIAGNOSIS — R0602 Shortness of breath: Secondary | ICD-10-CM | POA: Diagnosis present

## 2011-05-11 DIAGNOSIS — J019 Acute sinusitis, unspecified: Secondary | ICD-10-CM | POA: Diagnosis present

## 2011-05-11 DIAGNOSIS — D649 Anemia, unspecified: Secondary | ICD-10-CM | POA: Diagnosis present

## 2011-05-11 DIAGNOSIS — D638 Anemia in other chronic diseases classified elsewhere: Secondary | ICD-10-CM | POA: Diagnosis present

## 2011-05-11 DIAGNOSIS — I10 Essential (primary) hypertension: Secondary | ICD-10-CM | POA: Diagnosis present

## 2011-05-11 DIAGNOSIS — F319 Bipolar disorder, unspecified: Secondary | ICD-10-CM | POA: Diagnosis present

## 2011-05-11 DIAGNOSIS — Z87891 Personal history of nicotine dependence: Secondary | ICD-10-CM

## 2011-05-11 DIAGNOSIS — J069 Acute upper respiratory infection, unspecified: Secondary | ICD-10-CM | POA: Diagnosis present

## 2011-05-11 HISTORY — DX: Bipolar disorder, unspecified: F31.9

## 2011-05-11 LAB — BASIC METABOLIC PANEL
BUN: 7 mg/dL (ref 6–23)
GFR calc non Af Amer: >90 mL/min (ref >90)
Glucose, Bld: 92 mg/dL (ref 70–99)
Potassium: 3.6 mEq/L (ref 3.5–5.1)

## 2011-05-11 LAB — DIFFERENTIAL
Eosinophils Absolute: 0.1 10*3/uL (ref 0.0–0.7)
Eosinophils Relative: 2 % (ref 0–5)
Lymphs Abs: 0.9 10*3/uL (ref 0.7–4.0)
Monocytes Relative: 8 % (ref 3–12)
Neutrophils Relative %: 75 % (ref 43–77)

## 2011-05-11 LAB — CBC
HCT: 35.2 % — ABNORMAL LOW (ref 36.0–46.0)
Hemoglobin: 10.3 g/dL — ABNORMAL LOW (ref 12.0–15.0)
MCH: 23 pg — ABNORMAL LOW (ref 26.0–34.0)
MCV: 78.6 fL (ref 78.0–100.0)
RBC: 4.48 MIL/uL (ref 3.87–5.11)

## 2011-05-11 MED ORDER — IPRATROPIUM BROMIDE 0.02 % IN SOLN
0.5000 mg | Freq: Once | RESPIRATORY_TRACT | Status: AC
  Administered 2011-05-11: 0.5 mg via RESPIRATORY_TRACT
  Filled 2011-05-11: qty 2.5

## 2011-05-11 MED ORDER — PREDNISONE 20 MG PO TABS
60.0000 mg | ORAL_TABLET | Freq: Once | ORAL | Status: AC
  Administered 2011-05-11: 60 mg via ORAL
  Filled 2011-05-11: qty 3

## 2011-05-11 MED ORDER — ALBUTEROL SULFATE (5 MG/ML) 0.5% IN NEBU
5.0000 mg | INHALATION_SOLUTION | Freq: Once | RESPIRATORY_TRACT | Status: AC
  Administered 2011-05-11: 5 mg via RESPIRATORY_TRACT
  Filled 2011-05-11: qty 1

## 2011-05-11 NOTE — ED Notes (Signed)
Attempted to call for report x2, nurse both times was busy and unable to take reports, will wait additional 5 min before x3 attempt.

## 2011-05-11 NOTE — ED Notes (Signed)
Pt states "this can't just be asthma, maybe it's CHF, ever since they changed my b/p med, I've had a cough that is yellow"

## 2011-05-11 NOTE — ED Notes (Signed)
Change to different bed, at this time, no available RN for report,waitng for on call nurse to arrive.

## 2011-05-11 NOTE — ED Notes (Signed)
Pt to xray

## 2011-05-11 NOTE — ED Provider Notes (Signed)
History     CSN: [id number]  Arrival date & time 05/11/11  1716   First MD Initiated Contact with Patient 05/11/11 1844      Chief Complaint  Patient presents with  . Shortness of Breath    HPI Pt states "this can't just be asthma, maybe it's CHF, ever since they changed my b/p med, I've had a cough that is yellow"  Past Medical History  Diagnosis Date  . Asthma   . Hypertension   . COPD (chronic obstructive pulmonary disease)   . Obesity   . Manic, depressive     History reviewed. No pertinent past surgical history.  History reviewed. No pertinent family history.  History  Substance Use Topics  . Smoking status: Former Games developer  . Smokeless tobacco: Not on file  . Alcohol Use: No    OB History    Grav Para Term Preterm Abortions TAB SAB Ect Mult Living                  Review of Systems  All other systems reviewed and are negative.    Allergies  Review of patient's allergies indicates no known allergies.  Home Medications   Current Outpatient Rx  Name Route Sig Dispense Refill  . ALBUTEROL SULFATE HFA 108 (90 BASE) MCG/ACT IN AERS Inhalation Inhale 2 puffs into the lungs every 6 (six) hours as needed. wheezing     . BECLOMETHASONE DIPROPIONATE 80 MCG/ACT IN AERS Inhalation Inhale 1 puff into the lungs 2 (two) times daily.      . CHLORPHENIRAMINE-ACETAMINOPHEN 2-325 MG PO TABS Oral Take 2 tablets by mouth daily.    Marland Kitchen LISINOPRIL-HYDROCHLOROTHIAZIDE 10-12.5 MG PO TABS Oral Take 1 tablet by mouth daily.      . MULTI-VITAMIN/MINERALS PO TABS Oral Take 1 tablet by mouth daily.        BP 147/70  Pulse 99  Temp(Src) 100.3 F (37.9 C) (Oral)  Resp 24  Wt 450 lb (204.119 kg)  SpO2 99%  LMP 04/28/2011  Physical Exam  Nursing note and vitals reviewed. Constitutional: She is oriented to person, place, and time. She appears well-developed and well-nourished. She has a sickly appearance. She appears distressed.       Morbidly obese  HENT:  Head: Normocephalic  and atraumatic.  Eyes: Pupils are equal, round, and reactive to light.  Neck: Normal range of motion.  Cardiovascular: Normal rate and intact distal pulses.   Pulmonary/Chest: Accessory muscle usage present. She is in respiratory distress. She has wheezes.  Abdominal: Normal appearance. She exhibits no distension.  Musculoskeletal: Normal range of motion.  Neurological: She is alert and oriented to person, place, and time. No cranial nerve deficit.  Skin: Skin is warm and dry. No rash noted.  Psychiatric: She has a normal mood and affect. Her behavior is normal.    ED Course  Procedures (including critical care time) Scheduled Meds:    . albuterol  5 mg Nebulization Once  . albuterol  5 mg Nebulization Once  . ipratropium  0.5 mg Nebulization Once  . predniSONE  60 mg Oral Once   Continuous Infusions:  PRN Meds:.  Labs Reviewed  CBC - Abnormal; Notable for the following:    Hemoglobin 10.3 (*)    HCT 35.2 (*)    MCH 23.0 (*)    MCHC 29.3 (*)    RDW 15.8 (*)    All other components within normal limits  PRO B NATRIURETIC PEPTIDE - Abnormal; Notable for the following:  Pro B Natriuretic peptide (BNP) 420.1 (*)    All other components within normal limits  DIFFERENTIAL  BASIC METABOLIC PANEL   Dg Chest 2 View  05/11/2011  *RADIOLOGY REPORT*  Clinical Data: Shortness of breath and cough.  CHEST - 2 VIEW  Comparison: 02/26/2011.  Findings: The heart is mildly enlarged.  The mediastinal and hilar contours are slightly prominent but unchanged.  There is vascular congestion and possible mild interstitial edema.  No definite effusions or focal infiltrates.  The bony thorax is intact.  IMPRESSION: Cardiac enlargement with vascular congestion and probable mild interstitial edema.  Original Report Authenticated By: P. Loralie Champagne, M.D.     1. Asthma       MDM         Nelia Shi, MD 05/11/11 2223

## 2011-05-11 NOTE — ED Notes (Signed)
md at bedside

## 2011-05-11 NOTE — H&P (Signed)
History and Physical Examination  Date: 05/11/2011  Patient name: Jenna Chandler Medical record number: [id number] Date of birth: 09-28-1965 Age: 46 y.o. Gender: female PCP: No primary provider on file.  Attending physician: Hollice Espy, MD  Chief Complaint:  Chief Complaint  Patient presents with  . Shortness of Breath     History of Present Illness: Jenna Chandler is an 46 y.o. female who is morbidly obese with asthma and COPD presented to the ER with complaints of worsening SOB and wheezing.  The patient reports that for the past several months she's been having difficulty with worsening asthma symptoms.  She reports that she feels like she has fluid on her chest.  She reports that she's coughing up yellow sputum.  When she arrived in the ER she was having significant wheezing.  She required multiple nebulizer treatments and steroids.  She only had minimal improvement in symptoms after these treatments.  She is reporting that she's coughing every few minutes.  She was using accessory muscles to breathe when she initially arrived in the emergency department.  She has had moderate improvement however she still having some significant respiratory issues.  Her chest x-ray was consistent with interstitial edema as well.  She doesn't have a known history of congestive heart failure but has poorly controlled hypertension.  She recently had some changes to her medication regimen for her hypertension.  Hospital admission was requested so that she can have further asthma exacerbation treatment and to further clarify the cause of the interstitial edema and cardiomegaly on chest x-ray.  Past Medical History Past Medical History  Diagnosis Date  . Asthma   . Hypertension   . COPD (chronic obstructive pulmonary disease)   . Obesity   . Manic, depressive     Past Surgical History History reviewed. No pertinent past surgical history.  Home Meds: Prior to Admission medications   Medication  Sig Start Date End Date Taking? Authorizing Provider  albuterol (PROVENTIL HFA;VENTOLIN HFA) 108 (90 BASE) MCG/ACT inhaler Inhale 2 puffs into the lungs every 6 (six) hours as needed. wheezing    Yes Historical Provider, MD  beclomethasone (QVAR) 80 MCG/ACT inhaler Inhale 1 puff into the lungs 2 (two) times daily.     Yes Historical Provider, MD  Chlorpheniramine-APAP (CORICIDIN) 2-325 MG TABS Take 2 tablets by mouth daily.   Yes Historical Provider, MD  lisinopril-hydrochlorothiazide (PRINZIDE,ZESTORETIC) 10-12.5 MG per tablet Take 1 tablet by mouth daily.     Yes Historical Provider, MD  Multiple Vitamins-Minerals (MULTIVITAMIN WITH MINERALS) tablet Take 1 tablet by mouth daily.     Yes Historical Provider, MD    Allergies: Review of patient's allergies indicates no known allergies.  Social History:  History   Social History  . Marital Status: Single    Spouse Name: N/A    Number of Children: N/A  . Years of Education: N/A   Occupational History  . Not on file.   Social History Main Topics  . Smoking status: Former Games developer  . Smokeless tobacco: Not on file  . Alcohol Use: No  . Drug Use: No  . Sexually Active:    Other Topics Concern  . Not on file   Social History Narrative  . No narrative on file   Family History: History reviewed. No pertinent family history.  Review of Systems: Pertinent items are noted in HPI. All other systems reviewed and reported as negative.   Physical Exam: Blood pressure 147/70, pulse 99, temperature 100.3 F (37.9  C), temperature source Oral, resp. rate 24, weight 204.119 kg (450 lb), last menstrual period 04/28/2011, SpO2 99.00%. General appearance: alert, cooperative, appears stated age and no distress Head: Normocephalic, without obvious abnormality, atraumatic Eyes: negative, conjunctivae/corneas clear. PERRL, EOM's intact. Fundi benign. Nose: Nares normal. Septum midline. Mucosa swollen with yellow crusting seen. sinus tenderness., no  postnasal drainage seen Throat: lips, mucosa, and tongue normal; teeth and gums normal Neck: no adenopathy, no carotid bruit, no JVD, supple, symmetrical, trachea midline and thyroid not enlarged, symmetric, no tenderness/mass/nodules Lungs: bilateral exp wheezes heard, ant crackles LUL Heart: S1, S2 normal Abdomen: morbidly obese, soft, nondistended, nontender, no masses palpated Extremities: no cyanosis seen  Lab  And Imaging results:  Results for orders placed during the hospital encounter of 05/11/11 (from the past 24 hour(s))  CBC     Status: Abnormal   Collection Time   05/11/11  5:34 PM      Component Value Range   WBC 6.3  4.0 - 10.5 (K/uL)   RBC 4.48  3.87 - 5.11 (MIL/uL)   Hemoglobin 10.3 (*) 12.0 - 15.0 (g/dL)   HCT 16.1 (*) 09.6 - 46.0 (%)   MCV 78.6  78.0 - 100.0 (fL)   MCH 23.0 (*) 26.0 - 34.0 (pg)   MCHC 29.3 (*) 30.0 - 36.0 (g/dL)   RDW 04.5 (*) 40.9 - 15.5 (%)   Platelets 229  150 - 400 (K/uL)  DIFFERENTIAL     Status: Normal   Collection Time   05/11/11  5:34 PM      Component Value Range   Neutrophils Relative 75  43 - 77 (%)   Neutro Abs 4.8  1.7 - 7.7 (K/uL)   Lymphocytes Relative 15  12 - 46 (%)   Lymphs Abs 0.9  0.7 - 4.0 (K/uL)   Monocytes Relative 8  3 - 12 (%)   Monocytes Absolute 0.5  0.1 - 1.0 (K/uL)   Eosinophils Relative 2  0 - 5 (%)   Eosinophils Absolute 0.1  0.0 - 0.7 (K/uL)   Basophils Relative 0  0 - 1 (%)   Basophils Absolute 0.0  0.0 - 0.1 (K/uL)  BASIC METABOLIC PANEL     Status: Normal   Collection Time   05/11/11  5:34 PM      Component Value Range   Sodium 138  135 - 145 (mEq/L)   Potassium 3.6  3.5 - 5.1 (mEq/L)   Chloride 101  96 - 112 (mEq/L)   CO2 28  19 - 32 (mEq/L)   Glucose, Bld 92  70 - 99 (mg/dL)   BUN 7  6 - 23 (mg/dL)   Creatinine, Ser 8.11  0.50 - 1.10 (mg/dL)   Calcium 8.8  8.4 - 91.4 (mg/dL)   GFR calc non Af Amer >90  >90 (mL/min)   GFR calc Af Amer >90  >90 (mL/min)  PRO B NATRIURETIC PEPTIDE     Status: Abnormal    Collection Time   05/11/11  5:34 PM      Component Value Range   Pro B Natriuretic peptide (BNP) 420.1 (*) 0 - 125 (pg/mL)   EKG Results:  Orders placed during the hospital encounter of 02/26/11  . EKG     Impression   Asthma exacerbation in COPD  COPD (chronic obstructive pulmonary disease)  CHF (congestive heart failure)  SOB (shortness of breath)  Respiratory distress  URI (upper respiratory infection)  Sinusitis acute  HTN (hypertension), poorly controlled  Question of CHF  Morbid obesity  Manic, depressive  Anemia  Plan  Admit for IV steroids, nebs, start IV antibiotics, Give lasix IV, recheck CXR in AM, Recheck BNP in AM.  Check ECHO to rule out CHF.  Follow electrolytes.  Provide supplemental oxygen.  Follow BPs and adjust meds as required.  Please see orders.    Standley Dakins MD [facility] [place], [place] [phone] 05/11/2011, 11:14 PM

## 2011-05-12 ENCOUNTER — Inpatient Hospital Stay (HOSPITAL_COMMUNITY): Payer: Medicaid Other

## 2011-05-12 DIAGNOSIS — I517 Cardiomegaly: Secondary | ICD-10-CM

## 2011-05-12 LAB — COMPREHENSIVE METABOLIC PANEL
ALT: 11 U/L (ref 0–35)
Alkaline Phosphatase: 62 U/L (ref 39–117)
BUN: 7 mg/dL (ref 6–23)
CO2: 29 mEq/L (ref 19–32)
Chloride: 100 mEq/L (ref 96–112)
GFR calc Af Amer: >90 mL/min (ref >90)
GFR calc non Af Amer: >90 mL/min (ref >90)
Glucose, Bld: 133 mg/dL — ABNORMAL HIGH (ref 70–99)
Potassium: 3.7 mEq/L (ref 3.5–5.1)
Sodium: 138 mEq/L (ref 135–145)
Total Bilirubin: 0.3 mg/dL (ref 0.3–1.2)
Total Protein: 7 g/dL (ref 6.0–8.3)

## 2011-05-12 LAB — CBC
HCT: 34.1 % — ABNORMAL LOW (ref 36.0–46.0)
MCHC: 28.7 g/dL — ABNORMAL LOW (ref 30.0–36.0)
Platelets: 307 10*3/uL (ref 150–400)
RDW: 15.8 % — ABNORMAL HIGH (ref 11.5–15.5)

## 2011-05-12 LAB — CARDIAC PANEL(CRET KIN+CKTOT+MB+TROPI)
CK, MB: 11.5 ng/mL (ref 0.3–4.0)
CK, MB: 10.6 ng/mL (ref 0.3–4.0)
Relative Index: 1 (ref 0.0–2.5)
Total CK: 1380 U/L — ABNORMAL HIGH (ref 7–177)
Troponin I: <0.3 ng/mL (ref <0.30)
Troponin I: <0.3 ng/mL (ref <0.30)

## 2011-05-12 MED ORDER — ACETAMINOPHEN 325 MG PO TABS
650.0000 mg | ORAL_TABLET | Freq: Four times a day (QID) | ORAL | Status: DC | PRN
  Administered 2011-05-12 – 2011-05-16 (×2): 650 mg via ORAL
  Filled 2011-05-12 (×3): qty 2

## 2011-05-12 MED ORDER — LISINOPRIL 20 MG PO TABS
20.0000 mg | ORAL_TABLET | Freq: Every day | ORAL | Status: DC
  Administered 2011-05-12: 20 mg via ORAL
  Filled 2011-05-12: qty 1

## 2011-05-12 MED ORDER — ACETAMINOPHEN 650 MG RE SUPP: 650.0000 mg | Freq: Four times a day (QID) | RECTAL | Status: DC | PRN

## 2011-05-12 MED ORDER — HYDROCODONE-ACETAMINOPHEN 5-325 MG PO TABS
2.0000 | ORAL_TABLET | Freq: Four times a day (QID) | ORAL | Status: DC | PRN
  Administered 2011-05-12 – 2011-05-17 (×8): 2 via ORAL
  Filled 2011-05-12 (×8): qty 2

## 2011-05-12 MED ORDER — HYDRALAZINE HCL 20 MG/ML IJ SOLN
2.0000 mg | Freq: Four times a day (QID) | INTRAMUSCULAR | Status: DC | PRN
  Administered 2011-05-12 – 2011-05-13 (×4): 2 mg via INTRAVENOUS
  Filled 2011-05-12 (×4): qty 1

## 2011-05-12 MED ORDER — MOXIFLOXACIN HCL 400 MG PO TABS
400.0000 mg | ORAL_TABLET | Freq: Every day | ORAL | Status: DC
  Administered 2011-05-12 – 2011-05-18 (×7): 400 mg via ORAL
  Filled 2011-05-12 (×8): qty 1

## 2011-05-12 MED ORDER — ALBUTEROL SULFATE (5 MG/ML) 0.5% IN NEBU
2.5000 mg | INHALATION_SOLUTION | RESPIRATORY_TRACT | Status: DC
  Administered 2011-05-12 – 2011-05-15 (×18): 2.5 mg via RESPIRATORY_TRACT
  Filled 2011-05-12 (×18): qty 0.5

## 2011-05-12 MED ORDER — FUROSEMIDE 10 MG/ML IJ SOLN
40.0000 mg | Freq: Once | INTRAMUSCULAR | Status: AC
  Administered 2011-05-12: 40 mg via INTRAVENOUS
  Filled 2011-05-12: qty 4

## 2011-05-12 MED ORDER — METOPROLOL TARTRATE 12.5 MG HALF TABLET
12.5000 mg | ORAL_TABLET | Freq: Two times a day (BID) | ORAL | Status: DC
  Administered 2011-05-12 – 2011-05-13 (×4): 12.5 mg via ORAL
  Filled 2011-05-12 (×6): qty 1

## 2011-05-12 MED ORDER — BISACODYL 5 MG PO TBEC: 5.0000 mg | DELAYED_RELEASE_TABLET | Freq: Every day | ORAL | Status: DC | PRN

## 2011-05-12 MED ORDER — CLONIDINE HCL 0.1 MG PO TABS
0.1000 mg | ORAL_TABLET | Freq: Once | ORAL | Status: AC
  Administered 2011-05-12: 0.1 mg via ORAL
  Filled 2011-05-12: qty 1

## 2011-05-12 MED ORDER — ZOLPIDEM TARTRATE 5 MG PO TABS
5.0000 mg | ORAL_TABLET | Freq: Every evening | ORAL | Status: DC | PRN
  Administered 2011-05-12 – 2011-05-15 (×4): 5 mg via ORAL
  Filled 2011-05-12 (×4): qty 1

## 2011-05-12 MED ORDER — LOSARTAN POTASSIUM 50 MG PO TABS
50.0000 mg | ORAL_TABLET | Freq: Every day | ORAL | Status: DC
  Administered 2011-05-12 – 2011-05-15 (×4): 50 mg via ORAL
  Filled 2011-05-12 (×4): qty 1

## 2011-05-12 MED ORDER — SODIUM CHLORIDE 0.9 % IJ SOLN
3.0000 mL | INTRAMUSCULAR | Status: DC | PRN
  Administered 2011-05-15: 3 mL via INTRAVENOUS

## 2011-05-12 MED ORDER — HYDROCHLOROTHIAZIDE 12.5 MG PO CAPS
12.5000 mg | ORAL_CAPSULE | Freq: Every day | ORAL | Status: DC
  Administered 2011-05-12 – 2011-05-15 (×4): 12.5 mg via ORAL
  Filled 2011-05-12 (×4): qty 1

## 2011-05-12 MED ORDER — MOXIFLOXACIN HCL IN NACL 400 MG/250ML IV SOLN
400.0000 mg | Freq: Every day | INTRAVENOUS | Status: DC
Start: 2011-05-12 — End: 2011-05-12
  Administered 2011-05-12: 400 mg via INTRAVENOUS
  Filled 2011-05-12 (×2): qty 250

## 2011-05-12 MED ORDER — BENZONATATE 100 MG PO CAPS
100.0000 mg | ORAL_CAPSULE | Freq: Two times a day (BID) | ORAL | Status: DC | PRN
  Administered 2011-05-12 – 2011-05-13 (×2): 100 mg via ORAL
  Filled 2011-05-12 (×2): qty 1

## 2011-05-12 MED ORDER — ENOXAPARIN SODIUM 40 MG/0.4ML ~~LOC~~ SOLN
40.0000 mg | SUBCUTANEOUS | Status: DC
  Administered 2011-05-12 – 2011-05-18 (×7): 40 mg via SUBCUTANEOUS
  Filled 2011-05-12 (×8): qty 0.4

## 2011-05-12 MED ORDER — METHYLPREDNISOLONE SODIUM SUCC 125 MG IJ SOLR
60.0000 mg | Freq: Two times a day (BID) | INTRAMUSCULAR | Status: DC
  Administered 2011-05-13: 60 mg via INTRAVENOUS
  Filled 2011-05-12 (×3): qty 0.96

## 2011-05-12 MED ORDER — SODIUM CHLORIDE 0.9 % IV SOLN: 250.0000 mL | INTRAVENOUS | Status: DC | PRN

## 2011-05-12 MED ORDER — MENTHOL 3 MG MT LOZG
1.0000 | LOZENGE | OROMUCOSAL | Status: DC | PRN
  Administered 2011-05-12: 3 mg via ORAL
  Filled 2011-05-12 (×2): qty 9

## 2011-05-12 MED ORDER — HYDROCHLOROTHIAZIDE 25 MG PO TABS
12.5000 mg | ORAL_TABLET | Freq: Every day | ORAL | Status: DC
  Filled 2011-05-12: qty 0.5

## 2011-05-12 MED ORDER — PNEUMOCOCCAL VAC POLYVALENT 25 MCG/0.5ML IJ INJ
0.5000 mL | INJECTION | Freq: Once | INTRAMUSCULAR | Status: DC
  Filled 2011-05-12 (×2): qty 0.5

## 2011-05-12 MED ORDER — FLUTICASONE PROPIONATE HFA 44 MCG/ACT IN AERO
1.0000 | INHALATION_SPRAY | Freq: Two times a day (BID) | RESPIRATORY_TRACT | Status: DC
  Administered 2011-05-12 – 2011-05-15 (×8): 1 via RESPIRATORY_TRACT
  Filled 2011-05-12: qty 10.6

## 2011-05-12 MED ORDER — ALBUTEROL SULFATE HFA 108 (90 BASE) MCG/ACT IN AERS
2.0000 | INHALATION_SPRAY | Freq: Four times a day (QID) | RESPIRATORY_TRACT | Status: DC | PRN
  Administered 2011-05-12: 2 via RESPIRATORY_TRACT
  Filled 2011-05-12: qty 6.7

## 2011-05-12 MED ORDER — GUAIFENESIN ER 600 MG PO TB12
1200.0000 mg | ORAL_TABLET | Freq: Two times a day (BID) | ORAL | Status: AC
  Administered 2011-05-12 – 2011-05-13 (×5): 1200 mg via ORAL
  Filled 2011-05-12 (×5): qty 2

## 2011-05-12 MED ORDER — METHYLPREDNISOLONE SODIUM SUCC 125 MG IJ SOLR
60.0000 mg | Freq: Four times a day (QID) | INTRAMUSCULAR | Status: DC
  Administered 2011-05-12 (×4): 60 mg via INTRAVENOUS
  Filled 2011-05-12 (×11): qty 0.96

## 2011-05-12 MED ORDER — SODIUM CHLORIDE 0.9 % IJ SOLN
3.0000 mL | Freq: Two times a day (BID) | INTRAMUSCULAR | Status: DC
  Administered 2011-05-12 – 2011-05-17 (×10): 3 mL via INTRAVENOUS

## 2011-05-12 MED ORDER — SODIUM CHLORIDE 0.9 % IJ SOLN
3.0000 mL | Freq: Two times a day (BID) | INTRAMUSCULAR | Status: DC
  Administered 2011-05-12 – 2011-05-16 (×4): 3 mL via INTRAVENOUS

## 2011-05-12 NOTE — Progress Notes (Signed)
PT Cancellation Note  Treatment cancelled today due to pt reports she has been up to the bathroom on her own and does not need PT. Confirmed with RN . Will defer PT evaluation.  Please reorder if patient develops difficulty with mobility  Donnetta Hail 05/12/2011, 12:44 PM

## 2011-05-12 NOTE — Progress Notes (Signed)
Notified T.Callahan,NP about critical lab ckmb results called to this RN. No new orders noted at this time.

## 2011-05-12 NOTE — Progress Notes (Signed)
Pt listed as self pay with no insurance coverage Pt confirms she is self pay [place] resident CM and [facility] community liaison spoke with her Pt offered [facility] services to assist with finding a [place] self pay provider Referral sent to [facility] inpatient liasion Pt has a current [facility] card that expires on 09/21/11

## 2011-05-12 NOTE — Progress Notes (Signed)
Subjective: Reports continued sob/cough. Lying in bed, conversing without problem. NAD  Objective: Vital signs Filed Vitals:   05/12/11 0200 05/12/11 0455 05/12/11 0612 05/12/11 0900  BP: 161/82 161/76 167/102   Pulse: 97 93 89   Temp: 98.3 F (36.8 C) 98.2 F (36.8 C) 98.6 F (37 C)   TempSrc: Oral Oral Oral   Resp: 22 20 20    Height:   5\' 4"  (1.626 m)   Weight: 204.119 kg (450 lb)     SpO2: 95% 93% 92% 90%   Weight change:  Last BM Date: 05/11/11  Intake/Output from previous day: 04/04 0701 - 04/05 0700 In: -  Out: 2050 [Urine:2050]     Physical Exam: General: Alert, awake, oriented x3, in no acute distress. HEENT: No bruits, no goiter. Mucus membranes moist/pink Heart: Regular rate and rhythm, without murmurs, rubs, gallops. Trace -1+LEE Lungs:Mild increased work of breathing. Breath sounds with scattered rhonchi throughout with crackles in bases. Frequent moist productive cough. Abdomen:Morbidly obese. Soft, nontender, nondistended, positive bowel sounds. Extremities: No clubbing cyanosis or edema with positive pedal pulses. Trace - 1+LEE Neuro: Grossly intact, nonfocal.    Lab Results: Basic Metabolic Panel:  Basename 05/12/11 0220 05/11/11 1734  NA 138 138  K 3.7 3.6  CL 100 101  CO2 29 28  GLUCOSE 133* 92  BUN 7 7  CREATININE 0.79 0.63  CALCIUM 8.9 8.8  MG 1.9 --  PHOS -- --   Liver Function Tests:  Basename 05/12/11 0220  AST 22  ALT 11  ALKPHOS 62  BILITOT 0.3  PROT 7.0  ALBUMIN 3.3*   No results found for this basename: LIPASE:2,AMYLASE:2 in the last 72 hours No results found for this basename: AMMONIA:2 in the last 72 hours CBC:  Basename 05/12/11 0220 05/11/11 1734  WBC 7.1 6.3  NEUTROABS -- 4.8  HGB 9.8* 10.3*  HCT 34.1* 35.2*  MCV 78.6 78.6  PLT 307 229   Cardiac Enzymes:  Basename 05/12/11 0926 05/12/11 0220  CKTOTAL 1105* 869*  CKMB PENDING 12.0*  CKMBINDEX -- --  TROPONINI <0.30 <0.30   BNP:  Basename 05/12/11  0220 05/11/11 1734  PROBNP 546.9* 420.1*   D-Dimer: No results found for this basename: DDIMER:2 in the last 72 hours CBG: No results found for this basename: GLUCAP:6 in the last 72 hours Hemoglobin A1C: No results found for this basename: HGBA1C in the last 72 hours Fasting Lipid Panel: No results found for this basename: CHOL,HDL,LDLCALC,TRIG,CHOLHDL,LDLDIRECT in the last 72 hours Thyroid Function Tests: No results found for this basename: TSH,T4TOTAL,FREET4,T3FREE,THYROIDAB in the last 72 hours Anemia Panel: No results found for this basename: VITAMINB12,FOLATE,FERRITIN,TIBC,IRON,RETICCTPCT in the last 72 hours Coagulation: No results found for this basename: LABPROT:2,INR:2 in the last 72 hours Urine Drug Screen: Drugs of Abuse  No results found for this basename: labopia, cocainscrnur, labbenz, amphetmu, thcu, labbarb    Alcohol Level: No results found for this basename: ETH:2 in the last 72 hours Urinalysis: No results found for this basename: COLORURINE:2,APPERANCEUR:2,LABSPEC:2,PHURINE:2,GLUCOSEU:2,HGBUR:2,BILIRUBINUR:2,KETONESUR:2,PROTEINUR:2,UROBILINOGEN:2,NITRITE:2,LEUKOCYTESUR:2 in the last 72 hours Misc. Labs:  No results found for this or any previous visit (from the past 240 hour(s)).  Studies/Results: Dg Chest 2 View  05/11/2011  *RADIOLOGY REPORT*  Clinical Data: Shortness of breath and cough.  CHEST - 2 VIEW  Comparison: 02/26/2011.  Findings: The heart is mildly enlarged.  The mediastinal and hilar contours are slightly prominent but unchanged.  There is vascular congestion and possible mild interstitial edema.  No definite effusions or focal infiltrates.  The  bony thorax is intact.  IMPRESSION: Cardiac enlargement with vascular congestion and probable mild interstitial edema.  Original Report Authenticated By: P. Loralie Champagne, M.D.    Medications: Scheduled Meds:   . albuterol  2.5 mg Nebulization Q4H  . albuterol  5 mg Nebulization Once  . albuterol  5 mg  Nebulization Once  . cloNIDine  0.1 mg Oral Once  . enoxaparin  40 mg Subcutaneous Q24H  . fluticasone  1 puff Inhalation BID  . furosemide  40 mg Intravenous Once  . guaiFENesin  1,200 mg Oral BID  . hydrochlorothiazide  12.5 mg Oral Daily  . ipratropium  0.5 mg Nebulization Once  . lisinopril  20 mg Oral Daily  . methylPREDNISolone (SOLU-MEDROL) injection  60 mg Intravenous Q6H  . metoprolol tartrate  12.5 mg Oral BID  . moxifloxacin  400 mg Oral q1800  . pneumococcal 23 valent vaccine  0.5 mL Intramuscular Once  . predniSONE  60 mg Oral Once  . sodium chloride  3 mL Intravenous Q12H  . sodium chloride  3 mL Intravenous Q12H  . DISCONTD: hydrochlorothiazide  12.5 mg Oral Daily  . DISCONTD: moxifloxacin  400 mg Intravenous QHS   Continuous Infusions:  PRN Meds:.sodium chloride, acetaminophen, acetaminophen, bisacodyl, hydrALAZINE, HYDROcodone-acetaminophen, menthol-cetylpyridinium, sodium chloride, zolpidem, DISCONTD: albuterol  Assessment/Plan:  Principal Problem:  *Asthma exacerbation in COPD: no improvement. Sats 90-93 on 2L. Received one neb treatment in ED. Will schedule nebs and continue solumedrol, 02 support and monitor sats.  Active Problems:  COPD (chronic obstructive pulmonary disease): see #1  CHF (congestive heart failure): BNP 546 up from 420 yesterday. Will continue lasix. Results 2decho pending.    URI (upper respiratory infection): wc WNL. Afebrile. Head congestion. Avelox day #2.   HTN (hypertension): poor control. Will add low dose lopressor as HR 80-90 as well. Will also provide prn hydralazine with parameters.   Morbid obesity: Nutritional consult   Anemia: likely of chronic disease. Will check anemia panel.   Dispo: home when medically stable. Hopefully 24-48hrs.     LOS: 1 day   [facility] M 05/12/2011, 10:40 AM

## 2011-05-12 NOTE — Progress Notes (Signed)
OT Screen Order received, chart reviewed. Spoke briefly with pt and fiancee who both state that pt is independent w/ADLs and mobility. Will defer OT eval and sign off at this time. Please re-order if any issues with ADLs arise.  Garrel Ridgel, OTR/L  Pager [phone] 05/12/2011

## 2011-05-12 NOTE — Progress Notes (Signed)
  Echocardiogram 2D Echocardiogram has been performed.  Jenna Chandler [facility] 05/12/2011, 8:55 AM

## 2011-05-12 NOTE — Progress Notes (Signed)
Patient seen, examined and discussed with my nurse practitioner. Agree with above note. She still having some wheezing but that her airway flow. She complains of some shortness of breath, although it is much improved from previous. We'll plan to continue steroids plus nebulizers and oxygen and likely be able to discharge her home tomorrow.

## 2011-05-12 NOTE — Progress Notes (Signed)
   CARE MANAGEMENT NOTE 05/12/2011  Patient:  HEDY, GARRO   Account Number:  [id number]  Date Initiated:  05/12/2011  Documentation initiated by:  [facility]  Subjective/Objective Assessment:   ADMITTED W/SOB. ZO:[id number] OBESITY     Action/Plan:   FROM HOME W/FAMILY SUPPORT.INDEP PTA.[facility]-PCP/PHARMACY,HAS TRANSP.   Anticipated DC Date:  05/15/2011   Anticipated DC Plan:  HOME/SELF CARE         Choice offered to / List presented to:             Status of service:  In process, will continue to follow Medicare Important Message given?   (If response is "NO", the following Medicare IM given date fields will be blank) Date Medicare IM given:   Date Additional Medicare IM given:    Discharge Disposition:    Per UR Regulation:  Reviewed for med. necessity/level of care/duration of stay  If discussed at Long Length of Stay Meetings, dates discussed:    Comments:  05/12/11 Chayse Zatarain RN,BSN NCM [phone] ALREADY HAD [facility] APPT,  NOTED ON F/U.

## 2011-05-13 LAB — RETICULOCYTES
RBC.: 4.66 MIL/uL (ref 3.87–5.11)
Retic Count, Absolute: 79.2 10*3/uL (ref 19.0–186.0)
Retic Ct Pct: 1.7 % (ref 0.4–3.1)

## 2011-05-13 LAB — FOLATE: Folate: 11.1 ng/mL

## 2011-05-13 LAB — IRON AND TIBC: Saturation Ratios: 4 % — ABNORMAL LOW (ref 20–55)

## 2011-05-13 LAB — VITAMIN B12: Vitamin B-12: 546 pg/mL (ref 211–911)

## 2011-05-13 MED ORDER — BENZONATATE 100 MG PO CAPS
100.0000 mg | ORAL_CAPSULE | Freq: Three times a day (TID) | ORAL | Status: DC
  Administered 2011-05-13 (×2): 100 mg via ORAL
  Filled 2011-05-13 (×6): qty 1

## 2011-05-13 MED ORDER — ALPRAZOLAM 0.25 MG PO TABS
0.2500 mg | ORAL_TABLET | Freq: Three times a day (TID) | ORAL | Status: DC | PRN
  Administered 2011-05-13 – 2011-05-14 (×2): 0.25 mg via ORAL
  Filled 2011-05-13 (×2): qty 1

## 2011-05-13 MED ORDER — HYDRALAZINE HCL 20 MG/ML IJ SOLN
10.0000 mg | Freq: Three times a day (TID) | INTRAMUSCULAR | Status: DC | PRN
  Administered 2011-05-14 (×2): 10 mg via INTRAVENOUS
  Filled 2011-05-13 (×3): qty 1

## 2011-05-13 MED ORDER — METHYLPREDNISOLONE SODIUM SUCC 125 MG IJ SOLR
60.0000 mg | Freq: Three times a day (TID) | INTRAMUSCULAR | Status: DC
  Administered 2011-05-13 – 2011-05-14 (×4): 60 mg via INTRAVENOUS
  Filled 2011-05-13 (×9): qty 0.96

## 2011-05-13 MED ORDER — HYDROCOD POLST-CHLORPHEN POLST 10-8 MG/5ML PO LQCR
5.0000 mL | Freq: Once | ORAL | Status: AC
  Administered 2011-05-13: 5 mL via ORAL
  Filled 2011-05-13: qty 5

## 2011-05-13 NOTE — Progress Notes (Signed)
Subjective: Patient feeling worse today, more wheezing and shortness of breath. Increased cough.  Objective: Vital signs Filed Vitals:   05/13/11 0545 05/13/11 0700 05/13/11 0851 05/13/11 1017  BP: 176/116 194/89  176/100  Pulse: 80 72  89  Temp: 98.1 F (36.7 C)     TempSrc: Oral     Resp: 20     Height:      Weight:      SpO2: 94%  98%    Weight change:  Last BM Date: 05/12/11  Intake/Output from previous day: 04/05 0701 - 04/06 0700 In: 483 [P.O.:480; I.V.:3] Out: 104 [Urine:101; Stool:3]     Physical Exam: General: Alert, awake, oriented x3, moderate distress secondary to cough and breathing, fatigued  Heart: Regular rate and rhythm, S1-S2 Lungs:Mild increased work of breathing. Bilateral wheezes Abdomen:Morbidly obese. Soft, nontender, nondistended, positive bowel sounds. Extremities: No clubbing or cyanosis, trace pitting edema  Lab Results: Basic Metabolic Panel:  Basename 05/12/11 0220 05/11/11 1734  NA 138 138  K 3.7 3.6  CL 100 101  CO2 29 28  GLUCOSE 133* 92  BUN 7 7  CREATININE 0.79 0.63  CALCIUM 8.9 8.8  MG 1.9 --  PHOS -- --   Liver Function Tests:  [facility] 05/12/11 0220  AST 22  ALT 11  ALKPHOS 62  BILITOT 0.3  PROT 7.0  ALBUMIN 3.3*  CBC:  Basename 05/12/11 0220 05/11/11 1734  WBC 7.1 6.3  NEUTROABS -- 4.8  HGB 9.8* 10.3*  HCT 34.1* 35.2*  MCV 78.6 78.6  PLT 307 229   Cardiac Enzymes:  Basename 05/12/11 1823 05/12/11 0926 05/12/11 0220  CKTOTAL 1380* 1105* 869*  CKMB 10.6* 11.5* 12.0*  CKMBINDEX -- -- --  TROPONINI <0.30 <0.30 <0.30   BNP:  Basename 05/12/11 0220 05/11/11 1734  PROBNP 546.9* 420.1*   Hemoglobin A1C:  Basename 05/12/11 0220  HGBA1C 6.7*   Thyroid Function Tests:  Basename 05/12/11 0220  TSH 0.595  T4TOTAL --  FREET4 --  T3FREE --  THYROIDAB --   Anemia Panel:  Basename 05/13/11 0440  VITAMINB12 546  FOLATE 11.1  FERRITIN 16  TIBC 358  IRON 13*  RETICCTPCT 1.7     Studies/Results: X-ray Chest Pa And Lateral   05/12/2011   IMPRESSION: Interval resolution of pulmonary vascular congestion.  Stable cardiac enlargement  Original Report Authenticated By: Bertha Stakes, M.D.   Medications: Scheduled Meds:    . albuterol  2.5 mg Nebulization Q4H  . benzonatate  100 mg Oral TID  . chlorpheniramine-HYDROcodone  5 mL Oral Once  . enoxaparin  40 mg Subcutaneous Q24H  . fluticasone  1 puff Inhalation BID  . guaiFENesin  1,200 mg Oral BID  . hydrochlorothiazide  12.5 mg Oral Daily  . losartan  50 mg Oral Daily  . methylPREDNISolone (SOLU-MEDROL) injection  60 mg Intravenous Q8H  . metoprolol tartrate  12.5 mg Oral BID  . moxifloxacin  400 mg Oral q1800  . pneumococcal 23 valent vaccine  0.5 mL Intramuscular Once  . sodium chloride  3 mL Intravenous Q12H  . sodium chloride  3 mL Intravenous Q12H  . DISCONTD: lisinopril  20 mg Oral Daily  . DISCONTD: methylPREDNISolone (SOLU-MEDROL) injection  60 mg Intravenous Q6H  . DISCONTD: methylPREDNISolone (SOLU-MEDROL) injection  60 mg Intravenous Q12H   Continuous Infusions:  PRN Meds:.sodium chloride, acetaminophen, acetaminophen, ALPRAZolam, bisacodyl, hydrALAZINE, HYDROcodone-acetaminophen, menthol-cetylpyridinium, sodium chloride, zolpidem, DISCONTD: benzonatate  Assessment/Plan:  Principal Problem:  *Asthma exacerbation in COPD: Slightly worse today for  increased nebulizers. Provide extra long oxygen once the patient is not remove oxygen when she goes to the bathroom. Continue nebulizers. Continue mild dose Xanax to help some anxiety. Note echocardiogram was normal  Active Problems:  COPD (chronic obstructive pulmonary disease): see #1    URI (upper respiratory infection): wc WNL. Afebrile. Head congestion. Avelox day #2.   HTN (hypertension): poor control. Will add low dose lopressor as HR 80-90 as well. Will also provide prn hydralazine with parameters.   Morbid obesity: Nutritional consult    Anemia: likely of chronic disease. Will check anemia panel.   Dispo: home when medically stable. Hopefully 24-48hrs.     LOS: 2 days   Teddrick Mallari K 05/13/2011, 2:31 PM

## 2011-05-13 NOTE — Progress Notes (Signed)
Patient continue to have elevated blood pressure and even with PRN hydralazine. Dr. Rito Ehrlich notified order changed. F/U with plan of care.

## 2011-05-14 LAB — CBC
Hemoglobin: 11.2 g/dL — ABNORMAL LOW (ref 12.0–15.0)
MCH: 23 pg — ABNORMAL LOW (ref 26.0–34.0)
RBC: 4.88 MIL/uL (ref 3.87–5.11)

## 2011-05-14 MED ORDER — ALPRAZOLAM 0.25 MG PO TABS
0.2500 mg | ORAL_TABLET | Freq: Three times a day (TID) | ORAL | Status: DC
  Administered 2011-05-14: 0.25 mg via ORAL
  Filled 2011-05-14: qty 1

## 2011-05-14 MED ORDER — METOPROLOL TARTRATE 25 MG PO TABS
25.0000 mg | ORAL_TABLET | Freq: Two times a day (BID) | ORAL | Status: DC
  Administered 2011-05-14 – 2011-05-15 (×2): 25 mg via ORAL
  Filled 2011-05-14 (×3): qty 1

## 2011-05-14 MED ORDER — CLONIDINE HCL 0.3 MG PO TABS
0.3000 mg | ORAL_TABLET | Freq: Two times a day (BID) | ORAL | Status: DC
  Administered 2011-05-14 – 2011-05-15 (×2): 0.3 mg via ORAL
  Filled 2011-05-14 (×3): qty 1

## 2011-05-14 MED ORDER — CLONIDINE HCL 0.2 MG PO TABS
0.2000 mg | ORAL_TABLET | Freq: Two times a day (BID) | ORAL | Status: DC
  Administered 2011-05-14: 0.2 mg via ORAL
  Filled 2011-05-14 (×4): qty 1

## 2011-05-14 MED ORDER — FUROSEMIDE 20 MG PO TABS
20.0000 mg | ORAL_TABLET | Freq: Every day | ORAL | Status: DC
  Administered 2011-05-14 – 2011-05-15 (×2): 20 mg via ORAL
  Filled 2011-05-14 (×3): qty 1

## 2011-05-14 MED ORDER — ALPRAZOLAM 0.5 MG PO TABS
0.5000 mg | ORAL_TABLET | Freq: Three times a day (TID) | ORAL | Status: DC
  Filled 2011-05-14: qty 1

## 2011-05-14 MED ORDER — METHYLPREDNISOLONE SODIUM SUCC 125 MG IJ SOLR
60.0000 mg | Freq: Two times a day (BID) | INTRAMUSCULAR | Status: DC
  Administered 2011-05-15: 60 mg via INTRAVENOUS
  Filled 2011-05-14 (×3): qty 0.96

## 2011-05-14 MED ORDER — GUAIFENESIN-DM 100-10 MG/5ML PO SYRP
5.0000 mL | ORAL_SOLUTION | ORAL | Status: DC | PRN
  Administered 2011-05-14 (×3): 5 mL via ORAL
  Filled 2011-05-14 (×3): qty 10

## 2011-05-14 MED ORDER — ALPRAZOLAM 0.5 MG PO TABS
0.5000 mg | ORAL_TABLET | Freq: Three times a day (TID) | ORAL | Status: DC
  Administered 2011-05-14 – 2011-05-15 (×5): 0.5 mg via ORAL
  Filled 2011-05-14 (×4): qty 1

## 2011-05-14 NOTE — Progress Notes (Signed)
Patient continues to cough, Dr Rito Ehrlich informed medication changed; will continue to assess patient. Patient ambulated in the hall about 288ft with out oxygen, O2 sat 93% room air but does have some SOB with activity and still needs oxygen after ambulation.

## 2011-05-14 NOTE — Progress Notes (Signed)
Subjective: Patient breathing okay, but her biggest complaint is of continued cough. As per nursing she still remains quite anxious  Objective: Vital signs Filed Vitals:   05/14/11 1000 05/14/11 1101 05/14/11 1334 05/14/11 1527  BP: 186/102 160/86 167/119 190/119  Pulse:   77   Temp:   98 F (36.7 C)   TempSrc:   Oral   Resp:   20   Height:      Weight:      SpO2:   98%    Weight change:  Last BM Date: 05/13/11  Intake/Output from previous day: 04/06 0701 - 04/07 0700 In: 1323 [P.O.:1320; I.V.:3] Out: 6 [Urine:5; Stool:1] Total I/O In: 720 [P.O.:720] Out: 1 [Urine:1]   Physical Exam: General: Alert, awake, oriented x3, moderate distress secondary to cough and breathing, fatigued Heart: Regular rate and rhythm, S1-S2 Lungs: Decreased breath sounds throughout with some bilateral end expiratory wheezing, overall better than previous. Abdomen:Morbidly obese. Soft, nontender, nondistended, positive bowel sounds. Extremities: No clubbing or cyanosis, trace pitting edema  Lab Results: Basic Metabolic Panel:  Basename 05/12/11 0220 05/11/11 1734  NA 138 138  K 3.7 3.6  CL 100 101  CO2 29 28  GLUCOSE 133* 92  BUN 7 7  CREATININE 0.79 0.63  CALCIUM 8.9 8.8  MG 1.9 --  PHOS -- --   Liver Function Tests:  [facility] 05/12/11 0220  AST 22  ALT 11  ALKPHOS 62  BILITOT 0.3  PROT 7.0  ALBUMIN 3.3*  CBC:  Basename 05/12/11 0220 05/11/11 1734  WBC 7.1 6.3  NEUTROABS -- 4.8  HGB 9.8* 10.3*  HCT 34.1* 35.2*  MCV 78.6 78.6  PLT 307 229   Cardiac Enzymes:  Basename 05/12/11 1823 05/12/11 0926 05/12/11 0220  CKTOTAL 1380* 1105* 869*  CKMB 10.6* 11.5* 12.0*  CKMBINDEX -- -- --  TROPONINI <0.30 <0.30 <0.30   BNP:  Basename 05/12/11 0220 05/11/11 1734  PROBNP 546.9* 420.1*   Hemoglobin A1C:  Basename 05/12/11 0220  HGBA1C 6.7*   Thyroid Function Tests:  Basename 05/12/11 0220  TSH 0.595  T4TOTAL --  FREET4 --  T3FREE --  THYROIDAB --   Anemia  Panel:  Basename 05/13/11 0440  VITAMINB12 546  FOLATE 11.1  FERRITIN 16  TIBC 358  IRON 13*  RETICCTPCT 1.7    Studies/Results: X-ray Chest Pa And Lateral   05/12/2011   IMPRESSION: Interval resolution of pulmonary vascular congestion.  Stable cardiac enlargement  Original Report Authenticated By: Bertha Stakes, M.D.   Medications: Scheduled Meds:    . albuterol  2.5 mg Nebulization Q4H  . ALPRAZolam  0.5 mg Oral TID  . cloNIDine  0.3 mg Oral BID  . enoxaparin  40 mg Subcutaneous Q24H  . fluticasone  1 puff Inhalation BID  . furosemide  20 mg Oral Daily  . guaiFENesin  1,200 mg Oral BID  . hydrochlorothiazide  12.5 mg Oral Daily  . losartan  50 mg Oral Daily  . methylPREDNISolone (SOLU-MEDROL) injection  60 mg Intravenous Q12H  . metoprolol tartrate  25 mg Oral BID  . moxifloxacin  400 mg Oral q1800  . pneumococcal 23 valent vaccine  0.5 mL Intramuscular Once  . sodium chloride  3 mL Intravenous Q12H  . sodium chloride  3 mL Intravenous Q12H  . DISCONTD: ALPRAZolam  0.25 mg Oral TID  . DISCONTD: benzonatate  100 mg Oral TID  . DISCONTD: cloNIDine  0.2 mg Oral BID  . DISCONTD: methylPREDNISolone (SOLU-MEDROL) injection  60 mg Intravenous  Q8H  . DISCONTD: metoprolol tartrate  12.5 mg Oral BID   Continuous Infusions:  PRN Meds:.sodium chloride, acetaminophen, acetaminophen, bisacodyl, guaiFENesin-dextromethorphan, hydrALAZINE, HYDROcodone-acetaminophen, menthol-cetylpyridinium, sodium chloride, zolpidem, DISCONTD: ALPRAZolam, DISCONTD: hydrALAZINE  Assessment/Plan:  Principal Problem:  *Asthma exacerbation in COPD: Slightly worse today for increased nebulizers. Provide extra long oxygen once the patient is not remove oxygen when she goes to the bathroom. Continue nebulizers. Is slowly getting better. Tapering steroids. She started to set okay on room air.  Active Problems:  COPD (chronic obstructive pulmonary disease): see #1    URI (upper respiratory infection):  wc WNL. Afebrile. Head congestion. Avelox day #3.   HTN (hypertension): poor control. We'll be more aggressive is now this is from her principal problem. Have added and increased clonidine. Continue Lopressor plus Norvasc plus ARB.   Morbid obesity: Nutritional consult   Anemia: likely of chronic disease. Stable.  Dispo: home when medically stable, hopefully tomorrow.     LOS: 3 days   Hollice Espy 05/14/2011, 4:33 PM

## 2011-05-14 NOTE — Progress Notes (Signed)
Patient took his heart monitor off got in the shower without informing staff, educated patient/wife and reinforced the reason for the heart monitor and notifying staff.

## 2011-05-15 ENCOUNTER — Inpatient Hospital Stay (HOSPITAL_COMMUNITY): Payer: Medicaid Other

## 2011-05-15 DIAGNOSIS — J45909 Unspecified asthma, uncomplicated: Secondary | ICD-10-CM

## 2011-05-15 DIAGNOSIS — J810 Acute pulmonary edema: Secondary | ICD-10-CM | POA: Diagnosis present

## 2011-05-15 MED ORDER — FUROSEMIDE 40 MG PO TABS
40.0000 mg | ORAL_TABLET | Freq: Two times a day (BID) | ORAL | Status: DC
  Administered 2011-05-15 – 2011-05-18 (×7): 40 mg via ORAL
  Filled 2011-05-15 (×9): qty 1

## 2011-05-15 MED ORDER — VITAMINS A & D EX OINT
TOPICAL_OINTMENT | CUTANEOUS | Status: AC
  Administered 2011-05-15: 17:00:00
  Filled 2011-05-15: qty 5

## 2011-05-15 MED ORDER — TRAMADOL HCL 50 MG PO TABS
50.0000 mg | ORAL_TABLET | Freq: Four times a day (QID) | ORAL | Status: DC
  Administered 2011-05-15 – 2011-05-18 (×13): 50 mg via ORAL
  Filled 2011-05-15 (×19): qty 1

## 2011-05-15 MED ORDER — LEVALBUTEROL HCL 0.63 MG/3ML IN NEBU
0.6300 mg | INHALATION_SOLUTION | Freq: Four times a day (QID) | RESPIRATORY_TRACT | Status: DC
  Administered 2011-05-15 – 2011-05-18 (×11): 0.63 mg via RESPIRATORY_TRACT
  Filled 2011-05-15 (×21): qty 3

## 2011-05-15 MED ORDER — LOSARTAN POTASSIUM 50 MG PO TABS
100.0000 mg | ORAL_TABLET | Freq: Every day | ORAL | Status: DC
  Administered 2011-05-16 – 2011-05-18 (×3): 100 mg via ORAL
  Filled 2011-05-15 (×4): qty 2

## 2011-05-15 MED ORDER — METOPROLOL TARTRATE 50 MG PO TABS
50.0000 mg | ORAL_TABLET | Freq: Two times a day (BID) | ORAL | Status: DC
  Administered 2011-05-15 – 2011-05-18 (×6): 50 mg via ORAL
  Filled 2011-05-15 (×9): qty 1

## 2011-05-15 MED ORDER — FLUTICASONE-SALMETEROL 500-50 MCG/DOSE IN AEPB
1.0000 | INHALATION_SPRAY | Freq: Two times a day (BID) | RESPIRATORY_TRACT | Status: DC
  Administered 2011-05-15 – 2011-05-16 (×2): 1 via RESPIRATORY_TRACT
  Filled 2011-05-15: qty 14

## 2011-05-15 MED ORDER — METHYLPREDNISOLONE SODIUM SUCC 40 MG IJ SOLR
40.0000 mg | Freq: Two times a day (BID) | INTRAMUSCULAR | Status: DC
  Administered 2011-05-15 – 2011-05-17 (×4): 40 mg via INTRAVENOUS
  Filled 2011-05-15 (×7): qty 1

## 2011-05-15 MED ORDER — CLONIDINE HCL 0.3 MG PO TABS
0.3000 mg | ORAL_TABLET | Freq: Three times a day (TID) | ORAL | Status: DC
  Administered 2011-05-15 – 2011-05-18 (×10): 0.3 mg via ORAL
  Filled 2011-05-15 (×14): qty 1

## 2011-05-15 MED ORDER — AMLODIPINE BESYLATE 5 MG PO TABS
5.0000 mg | ORAL_TABLET | Freq: Every day | ORAL | Status: DC
  Filled 2011-05-15: qty 1

## 2011-05-15 MED ORDER — HYDRALAZINE HCL 25 MG PO TABS
25.0000 mg | ORAL_TABLET | Freq: Three times a day (TID) | ORAL | Status: DC
  Administered 2011-05-15 – 2011-05-18 (×9): 25 mg via ORAL
  Filled 2011-05-15 (×12): qty 1

## 2011-05-15 MED ORDER — AMLODIPINE BESYLATE 10 MG PO TABS
10.0000 mg | ORAL_TABLET | Freq: Every day | ORAL | Status: DC
  Administered 2011-05-16 – 2011-05-18 (×3): 10 mg via ORAL
  Filled 2011-05-15 (×4): qty 1

## 2011-05-15 MED ORDER — FLUTICASONE PROPIONATE 50 MCG/ACT NA SUSP
2.0000 | Freq: Every day | NASAL | Status: DC
  Administered 2011-05-15 – 2011-05-18 (×4): 2 via NASAL
  Filled 2011-05-15: qty 16

## 2011-05-15 NOTE — Progress Notes (Signed)
Subjective: Sitting on side of bed. NAD. Complained coughing.   Objective: Vital signs Filed Vitals:   05/14/11 2006 05/15/11 0543 05/15/11 0746 05/15/11 0826  BP:  174/108  181/76  Pulse:  83  94  Temp:  98.2 F (36.8 C)    TempSrc:  Oral    Resp:  22  24  Height:      Weight:  180.6 kg (398 lb 2.4 oz)    SpO2: 98% 95% 100%    Weight change: -1.746 kg (-3 lb 13.6 oz) Last BM Date: 05/13/11  Intake/Output from previous day: 04/07 0701 - 04/08 0700 In: 1566 [P.O.:1560; I.V.:6] Out: 7 [Urine:7]     Physical Exam: General: Alert, awake, oriented x3, in no acute distress. HEENT: No bruits, no goiter. Heart: Regular rate and rhythm, without murmurs, rubs, gallops.  Lungs: Increased work of breathing/coughing with conversation. Breath sounds diminished bilaterally with faint expiratory wheezes. Slightly prolonged expiratory phase.  Abdomen:Morbidly obese.  Soft, nontender, nondistended, positive bowel sounds. Extremities: No clubbing cyanosis with positive pedal pulses. Trace - 1+ pitting edema.  Neuro: Grossly intact, nonfocal.    Lab Results: Basic Metabolic Panel: No results found for this basename: NA:2,K:2,CL:2,CO2:2,GLUCOSE:2,BUN:2,CREATININE:2,CALCIUM:2,MG:2,PHOS:2 in the last 72 hours Liver Function Tests: No results found for this basename: AST:2,ALT:2,ALKPHOS:2,BILITOT:2,PROT:2,ALBUMIN:2 in the last 72 hours No results found for this basename: LIPASE:2,AMYLASE:2 in the last 72 hours No results found for this basename: AMMONIA:2 in the last 72 hours CBC:  Basename 05/14/11 1700  WBC 10.1  NEUTROABS --  HGB 11.2*  HCT 39.0  MCV 79.9  PLT 361   Cardiac Enzymes:  Basename 05/12/11 1823  CKTOTAL 1380*  CKMB 10.6*  CKMBINDEX --  TROPONINI <0.30   BNP: No results found for this basename: PROBNP:3 in the last 72 hours D-Dimer: No results found for this basename: DDIMER:2 in the last 72 hours CBG: No results found for this basename: GLUCAP:6 in the last  72 hours Hemoglobin A1C: No results found for this basename: HGBA1C in the last 72 hours Fasting Lipid Panel: No results found for this basename: CHOL,HDL,LDLCALC,TRIG,CHOLHDL,LDLDIRECT in the last 72 hours Thyroid Function Tests: No results found for this basename: TSH,T4TOTAL,FREET4,T3FREE,THYROIDAB in the last 72 hours Anemia Panel:  Basename 05/13/11 0440  VITAMINB12 546  FOLATE 11.1  FERRITIN 16  TIBC 358  IRON 13*  RETICCTPCT 1.7   Coagulation: No results found for this basename: LABPROT:2,INR:2 in the last 72 hours Urine Drug Screen: Drugs of Abuse  No results found for this basename: labopia, cocainscrnur, labbenz, amphetmu, thcu, labbarb    Alcohol Level: No results found for this basename: ETH:2 in the last 72 hours Urinalysis: No results found for this basename: COLORURINE:2,APPERANCEUR:2,LABSPEC:2,PHURINE:2,GLUCOSEU:2,HGBUR:2,BILIRUBINUR:2,KETONESUR:2,PROTEINUR:2,UROBILINOGEN:2,NITRITE:2,LEUKOCYTESUR:2 in the last 72 hours Misc. Labs:  No results found for this or any previous visit (from the past 240 hour(s)).  Studies/Results: No results found.  Medications: Scheduled Meds:   . albuterol  2.5 mg Nebulization Q4H  . ALPRAZolam  0.5 mg Oral TID  . amLODipine  5 mg Oral Daily  . cloNIDine  0.3 mg Oral BID  . enoxaparin  40 mg Subcutaneous Q24H  . fluticasone  1 puff Inhalation BID  . furosemide  20 mg Oral Daily  . hydrochlorothiazide  12.5 mg Oral Daily  . losartan  50 mg Oral Daily  . methylPREDNISolone (SOLU-MEDROL) injection  60 mg Intravenous Q12H  . metoprolol tartrate  25 mg Oral BID  . moxifloxacin  400 mg Oral q1800  . pneumococcal 23 valent vaccine  0.5  mL Intramuscular Once  . sodium chloride  3 mL Intravenous Q12H  . sodium chloride  3 mL Intravenous Q12H  . DISCONTD: ALPRAZolam  0.25 mg Oral TID  . DISCONTD: ALPRAZolam  0.5 mg Oral TID  . DISCONTD: cloNIDine  0.2 mg Oral BID  . DISCONTD: methylPREDNISolone (SOLU-MEDROL) injection  60 mg  Intravenous Q8H   Continuous Infusions:  PRN Meds:.sodium chloride, acetaminophen, acetaminophen, bisacodyl, guaiFENesin-dextromethorphan, hydrALAZINE, HYDROcodone-acetaminophen, menthol-cetylpyridinium, sodium chloride, zolpidem  Assessment/Plan:  Principal Problem:  *Asthma exacerbation in COPD Active Problems:  COPD (chronic obstructive pulmonary disease)  SOB (shortness of breath)  Respiratory distress  URI (upper respiratory infection)  Sinusitis acute  HTN (hypertension)  Morbid obesity  Manic, depressive  Anemia Asthma exacerbation in COPD: Slightl improvement in wheezing. Almost constant coughing, tight, high pitched.  Continue scheduled nebulizers.  Tapering steroids. 02 sat 93 yesterday with ambulation. Will recheck to day. Will request consult from pulmonology given slow progression.  Active Problems:  COPD (chronic obstructive pulmonary disease): see #1  URI (upper respiratory infection): wc WNL. Afebrile. Head congestion. Avelox day #4. Afebrile, non toxic.   HTN (hypertension): poor control. We'll be more aggressive is now this is from her principal problem. Have added and increased clonidine.  Will resume home norvasc and continue Lopressor plus ARB. Will monitor. Will need close OP follow up.  Morbid obesity: Nutritional consult  Anemia: likely of chronic disease. Stable.  Dispo: home hopefully today depending on pulm rec.       LOS: 4 days   [facility] M 05/15/2011, 12:56 PM

## 2011-05-15 NOTE — Consult Note (Signed)
Name: Jenna Chandler MRN: [id number] DOB: 01-25-1966    LOS: 4  PCCM CONSULT NOTE  History of Present Illness: This is a 46 year old morbidly obese African American female who was admitted with asthmatic bronchitic exacerbation and hypertension out of control. The patient's been aggressively treated for her asthma with bronchodilator therapy and corticosteroids. Despite this the patient has developed an incessant cough. The patient's cough is productive of bloody mucus. Patient denies chest pain. There is no fever. The patient denies prior known history of asthma or chronic obstructive lung disease.  Pulmonary critical care was asked to evaluate  Lines / Drains: none  Cultures: none  Antibiotics: avelox 4/5>>  Tests / Events: Echo: EF 65%  No wall motion or Valve abn.    Past Medical History  Diagnosis Date  . Asthma   . Hypertension   . COPD (chronic obstructive pulmonary disease)   . Obesity   . Manic, depressive    History reviewed. No pertinent past surgical history. Prior to Admission medications   Medication Sig Start Date End Date Taking? Authorizing Provider  albuterol (PROVENTIL HFA;VENTOLIN HFA) 108 (90 BASE) MCG/ACT inhaler Inhale 2 puffs into the lungs every 6 (six) hours as needed. wheezing    Yes Historical Provider, MD  beclomethasone (QVAR) 80 MCG/ACT inhaler Inhale 1 puff into the lungs 2 (two) times daily.     Yes Historical Provider, MD  Chlorpheniramine-APAP (CORICIDIN) 2-325 MG TABS Take 2 tablets by mouth daily.   Yes Historical Provider, MD  lisinopril-hydrochlorothiazide (PRINZIDE,ZESTORETIC) 10-12.5 MG per tablet Take 1 tablet by mouth daily.     Yes Historical Provider, MD  Multiple Vitamins-Minerals (MULTIVITAMIN WITH MINERALS) tablet Take 1 tablet by mouth daily.     Yes Historical Provider, MD   Allergies No Known Allergies  Family History History reviewed. No pertinent family history.  Social History  reports that she has quit smoking.  She does not have any smokeless tobacco history on file. She reports that she does not drink alcohol or use illicit drugs.  Review Of Systems  11 points review of systems is negative with an exception of listed in HPI.  Vital Signs: Temp:  [97.9 F (36.6 C)-98.2 F (36.8 C)] 97.9 F (36.6 C) (04/08 1416) Pulse Rate:  [83-94] 89  (04/08 1416) Resp:  [20-24] 20  (04/08 1416) BP: (169-190)/(76-120) 181/120 mmHg (04/08 1416) SpO2:  [88 %-100 %] 93 % (04/08 1416) FiO2 (%):  [28 %-30 %] 30 % (04/08 0746) Weight:  [180.6 kg (398 lb 2.4 oz)] 180.6 kg (398 lb 2.4 oz) (04/08 0543) I/O last 3 completed shifts: In: 2649 [P.O.:2640; I.V.:9] Out: 11 [Urine:11]  Physical Examination: General:  Elderly obese African American female with incessant coughing Neuro:  Awake and alert, moves all fours   HEENT:  Mild jugular venous distention, no thyromegaly, neck supple, his membranes moist Neck:  Neck supple with minimal jugular venous distention   Cardiovascular:  Regular rate and rhythm without S3 normal S1-S2, no murmur rub heave or gallop Lungs:  Bibasilar rales, expired wheezes which are mostly pseudo- wheeze in nature Abdomen:  Protuberant, bowel sounds active, no organomegaly, no mass Musculoskeletal:  Full range of motion without joint deformity Skin:  Clear  Asher O2   Labs and Imaging:  Reviewed.  Please refer to the Assessment and Plan section for relevant results.  Lab 05/12/11 0220 05/11/11 1734  NA 138 138  K 3.7 3.6  CL 100 101  CO2 29 28  GLUCOSE 133* 92  BUN 7 7  CREATININE 0.79 0.63  CALCIUM 8.9 8.8  MG 1.9 --  PHOS -- --    Lab 05/14/11 1700 05/12/11 0220 05/11/11 1734  HGB 11.2* 9.8* 10.3*  HCT 39.0 34.1* 35.2*  WBC 10.1 7.1 6.3  PLT 361 307 229    Lab 05/12/11 0220  AST 22  ALT 11  ALKPHOS 62  BILITOT 0.3  PROT 7.0  ALBUMIN 3.3*  INR --    Assessment and Plan: Patient Active Hospital Problem List: Asthma exacerbation in COPD (05/11/2011)   Assessment:  This patient appears to be well-controlled with underlying asthmatic bronchitis and COPD with recent exacerbation.    Plan: Adjust steroids, adjust bronchodilator therapy, continue oral Avelox   Respiratory distress (05/11/2011)   Assessment: Respiratory distress was due to asthmatic bronchitic exacerbation is slowly improving, however cyclical cough is exacerbating    Plan: We'll start cyclic cough protocol in the hospital for cough suppression   Sinusitis acute (05/11/2011)   Assessment: Concern that acute on chronic sinusitis may be driving the cough syndrome in this case    Plan: We'll add nasal hygiene and obtain CT scan of sinuses for further delineation of anatomy   HTN (hypertension) (05/11/2011)   Assessment: Hypertension is poorly controlled and I believe is aggravating the patient's symptom complex. I also feel this patient may have ongoing pulmonary edema contributing to the cough and hemoptysis    Plan: Will discuss with primary team but will likely need an increase in the dosage of losartan, addition of hydralazine, and increased dosage of clonidine. We'll also consider increasing Norvasc   Hemoptysis    Hemoptysis likely is due to pulmonary edema and vascular congestion. Plan Will followup with chest x-ray imaging Does not appear to be any indication for bronchoscopy    Shan Levans  M.D. Pulmonary and Critical Care Medicine [facility] Cell: [phone]   Pager: [phone] 05/15/2011, 3:03 PM

## 2011-05-15 NOTE — Progress Notes (Signed)
Pt walked in hall on room air-sats 88% while ambulating. Upon returning to bed, sats increased to 93% on RA. Pt had high pitched wheeze and cough upon exertion. Toya Smothers, NP notified. Will continue to monitor.  Audrie Lia

## 2011-05-15 NOTE — Progress Notes (Signed)
Patient seen, examined and discussed with my nurse practitioner. Agree with above note and exam. Pulmonary asked see patient and I am grateful for consultation. Her breathing looks to be improved. Still some wheezing, but good airway flow. Working on improving her wheezing, watching for volume overload and aggressively getting her blood pressure down.

## 2011-05-15 NOTE — Progress Notes (Signed)
Pt c/o mid sternal chest pain radiating down left arm. EKG and vitals obtained, one nitro given. Pt stated "maybe its just because Im anxious and walked in the hall today." Pt states pain could possibly be from coughing, pt did not request any futher pain medication at this time. No further Nitro given. Dr Rito Ehrlich notified. Will continue to monitor. Audrie Lia

## 2011-05-16 ENCOUNTER — Inpatient Hospital Stay (HOSPITAL_COMMUNITY): Payer: Medicaid Other

## 2011-05-16 LAB — OCCULT BLOOD X 1 CARD TO LAB, STOOL: Fecal Occult Bld: NEGATIVE

## 2011-05-16 MED ORDER — ALPRAZOLAM 1 MG PO TABS
1.0000 mg | ORAL_TABLET | Freq: Three times a day (TID) | ORAL | Status: DC
  Administered 2011-05-16 – 2011-05-18 (×8): 1 mg via ORAL
  Filled 2011-05-16 (×8): qty 1

## 2011-05-16 MED ORDER — PANTOPRAZOLE SODIUM 40 MG PO TBEC
40.0000 mg | DELAYED_RELEASE_TABLET | Freq: Two times a day (BID) | ORAL | Status: DC
  Administered 2011-05-16 – 2011-05-18 (×5): 40 mg via ORAL
  Filled 2011-05-16 (×6): qty 1

## 2011-05-16 MED ORDER — ISOSORBIDE MONONITRATE ER 30 MG PO TB24
30.0000 mg | ORAL_TABLET | Freq: Every day | ORAL | Status: DC
  Administered 2011-05-16 – 2011-05-18 (×3): 30 mg via ORAL
  Filled 2011-05-16 (×3): qty 1

## 2011-05-16 MED ORDER — BUDESONIDE 0.25 MG/2ML IN SUSP
0.2500 mg | Freq: Four times a day (QID) | RESPIRATORY_TRACT | Status: DC
  Administered 2011-05-16 – 2011-05-18 (×8): 0.25 mg via RESPIRATORY_TRACT
  Filled 2011-05-16 (×18): qty 2

## 2011-05-16 MED ORDER — IPRATROPIUM BROMIDE 0.02 % IN SOLN
0.5000 mg | Freq: Four times a day (QID) | RESPIRATORY_TRACT | Status: DC
  Administered 2011-05-16 – 2011-05-18 (×9): 0.5 mg via RESPIRATORY_TRACT
  Filled 2011-05-16 (×9): qty 2.5

## 2011-05-16 MED ORDER — HYDRALAZINE HCL 20 MG/ML IJ SOLN: 10.0000 mg | Freq: Four times a day (QID) | INTRAMUSCULAR | Status: DC | PRN

## 2011-05-16 NOTE — Consult Note (Signed)
Name: Jenna Chandler MRN: [id number] DOB: 1966-01-17    LOS: 5  PCCM PROGRESS NOTE  History of Present Illness: This is a 46 year old morbidly obese African American female who was admitted 4/4 with asthmatic bronchitic exacerbation and hypertension out of control. The patient's been aggressively treated for her asthma with bronchodilator therapy and corticosteroids. Despite this the patient has developed an incessant cough. The patient's cough is productive of bloody mucus. Quit smoking 2004 & was diagnosed with 'asthma' around the same time  Lines / Drains: none  Cultures: none  Antibiotics: avelox 4/5>>  Tests / Events: Echo: EF 65%  No wall motion or Valve abn.  C/o hoarse voice, does not like cpap mask -does not fit right  Vital Signs: Temp:  [97.7 F (36.5 C)-98.8 F (37.1 C)] 97.7 F (36.5 C) (04/09 0546) Pulse Rate:  [67-110] 67  (04/09 0546) Resp:  [20-22] 20  (04/09 0546) BP: (152-181)/(112-120) 171/119 mmHg (04/09 0546) SpO2:  [88 %-100 %] 98 % (04/09 0752) Weight:  [394 lb 13.5 oz (179.1 kg)] 394 lb 13.5 oz (179.1 kg) (04/09 0546) I/O last 3 completed shifts: In: 1326 [P.O.:1320; I.V.:6] Out: 5 [Urine:5]  Physical Examination: General:  Elderly obese African American female with incessant coughing Neuro:  Awake and alert, moves all fours   HEENT:  Mild jugular venous distention, no thyromegaly, neck supple, his membranes moist Neck:  Neck supple with minimal jugular venous distention   Cardiovascular:  Regular rate and rhythm without S3 normal S1-S2, no murmur rub heave or gallop Lungs:  Bibasilar rales, expired wheezes which are mostly pseudo- wheeze in nature. Could nor wear cpap due mask fit Abdomen:  Protuberant, bowel sounds active, no organomegaly, no mass Musculoskeletal:  Full range of motion without joint deformity Skin:  Clear   Labs and Imaging:  .  Lab 05/12/11 0220 05/11/11 1734  NA 138 138  K 3.7 3.6  CL 100 101  CO2 29 28  GLUCOSE  133* 92  BUN 7 7  CREATININE 0.79 0.63  CALCIUM 8.9 8.8  MG 1.9 --  PHOS -- --    Lab 05/14/11 1700 05/12/11 0220 05/11/11 1734  HGB 11.2* 9.8* 10.3*  HCT 39.0 34.1* 35.2*  WBC 10.1 7.1 6.3  PLT 361 307 229    Lab 05/12/11 0220  AST 22  ALT 11  ALKPHOS 62  BILITOT 0.3  PROT 7.0  ALBUMIN 3.3*  INR --    Assessment and Plan: Patient Active Hospital Problem List: Asthma exacerbation in COPD (05/11/2011)   Assessment: This patient appears to be well-controlled with underlying asthmatic bronchitis and COPD with recent exacerbation.    Plan: solumedrol 40 q 12, add atrovent to  bronchodilator therapy, continue oral Avelox              4/9 dc powdered inhalers and use nebs exclusively              4/9 and bid ppi  Respiratory distress (05/11/2011)   Assessment: Respiratory distress was due to asthmatic bronchitic exacerbation is slowly improving, however cyclical cough is exacerbating    Plan:  cyclic cough protocol in the hospital for cough suppression   Sinusitis acute (05/11/2011)   Assessment: Concern that acute on chronic sinusitis may be driving the cough syndrome in this case    Plan: We'll add nasal hygiene and obtain CT scan  (Negative sinusitis.)  HTN (hypertension) (05/11/2011)   Assessment: Hypertension is poorly controlled and I believe is aggravating the patient's symptom  complex. I also feel this patient may have ongoing pulmonary edema contributing to the cough and hemoptysis    Plan: Defer to primary team, ON Losartan, hydralazine, and increased dosage of clonidine &Norvasc \ NO ACE inhibitor please  Hemoptysis    Hemoptysis likely is due to pulmonary edema and vascular congestion. Plan Does not appear to be any indication for bronchoscopy    [facility] ACNP Adolph Pollack PCCM Pager [phone] till 3 pm If no answer page [phone]  Independently examined pt, evaluated data & formulated above care plan with NP [facility] V.  05/16/2011, 11:11 AM

## 2011-05-16 NOTE — Progress Notes (Signed)
Subjective: Pt complains "tired" and "weak". Sitting on side of bed dozing.   Objective: Vital signs Filed Vitals:   05/15/11 2143 05/16/11 0452 05/16/11 0546 05/16/11 0752  BP: 152/116 175/112 171/119   Pulse: 110 77 67   Temp: 98.8 F (37.1 C) 97.7 F (36.5 C) 97.7 F (36.5 C)   TempSrc: Oral Oral Oral   Resp: 22 22 20    Height:      Weight:   179.1 kg (394 lb 13.5 oz)   SpO2: 98% 100% 100% 98%   Weight change: -1.5 kg (-3 lb 4.9 oz) Last BM Date: 05/15/11  Intake/Output from previous day: 04/08 0701 - 04/09 0700 In: 720 [P.O.:720] Out: -      Physical Exam: General: Alert, awake, oriented x3, in no acute distress. HEENT: No bruits, no goiter. Mucus membranes moist/pink Heart: Regular rate and rhythm, without murmurs, rubs, gallops. 1+ LEE Lungs: Mild increased work of breathing with activity. Less coughing during exam. Breath sounds with expiratory wheeze particularly upper lobes. Fine crackles bilateral base.  Abdomen:  Morbidly obese, Soft, nontender, nondistended, positive bowel sounds. Extremities: No clubbing cyanosis with positive pedal pulses. Neuro: Grossly intact, nonfocal.    Lab Results: Basic Metabolic Panel: No results found for this basename: NA:2,K:2,CL:2,CO2:2,GLUCOSE:2,BUN:2,CREATININE:2,CALCIUM:2,MG:2,PHOS:2 in the last 72 hours Liver Function Tests: No results found for this basename: AST:2,ALT:2,ALKPHOS:2,BILITOT:2,PROT:2,ALBUMIN:2 in the last 72 hours No results found for this basename: LIPASE:2,AMYLASE:2 in the last 72 hours No results found for this basename: AMMONIA:2 in the last 72 hours CBC:  Basename 05/14/11 1700  WBC 10.1  NEUTROABS --  HGB 11.2*  HCT 39.0  MCV 79.9  PLT 361   Cardiac Enzymes: No results found for this basename: CKTOTAL:3,CKMB:3,CKMBINDEX:3,TROPONINI:3 in the last 72 hours BNP: No results found for this basename: PROBNP:3 in the last 72 hours D-Dimer: No results found for this basename: DDIMER:2 in the last  72 hours CBG: No results found for this basename: GLUCAP:6 in the last 72 hours Hemoglobin A1C: No results found for this basename: HGBA1C in the last 72 hours Fasting Lipid Panel: No results found for this basename: CHOL,HDL,LDLCALC,TRIG,CHOLHDL,LDLDIRECT in the last 72 hours Thyroid Function Tests: No results found for this basename: TSH,T4TOTAL,FREET4,T3FREE,THYROIDAB in the last 72 hours Anemia Panel: No results found for this basename: VITAMINB12,FOLATE,FERRITIN,TIBC,IRON,RETICCTPCT in the last 72 hours Coagulation: No results found for this basename: LABPROT:2,INR:2 in the last 72 hours Urine Drug Screen: Drugs of Abuse  No results found for this basename: labopia, cocainscrnur, labbenz, amphetmu, thcu, labbarb    Alcohol Level: No results found for this basename: ETH:2 in the last 72 hours Urinalysis: No results found for this basename: COLORURINE:2,APPERANCEUR:2,LABSPEC:2,PHURINE:2,GLUCOSEU:2,HGBUR:2,BILIRUBINUR:2,KETONESUR:2,PROTEINUR:2,UROBILINOGEN:2,NITRITE:2,LEUKOCYTESUR:2 in the last 72 hours Misc. Labs:  No results found for this or any previous visit (from the past 240 hour(s)).  Studies/Results: Dg Chest Port 1 View  05/16/2011  *RADIOLOGY REPORT*  Clinical Data: Pulmonary edema  PORTABLE CHEST - 1 VIEW  Comparison: Portable chest x-ray of 05/15/2011  Findings: The lungs are not quite as well aerated.  There is still a degree of pulmonary vascular congestion present.  The heart is within upper limits of normal in size.  IMPRESSION:   Diminished aeration.  Probable mild pulmonary vascular congestion.  Original Report Authenticated By: Juline Patch, M.D.   Dg Chest Port 1 View  05/15/2011  *RADIOLOGY REPORT*  Clinical Data: Short of breath.  Pulmonary edema.  PORTABLE CHEST - 1 VIEW  Comparison: 05/12/2011  Findings: Mild cardiomegaly.  Mild right hemidiaphragm elevation. Mildly  degraded exam due to AP portable technique and patient body habitus.  No pleural effusion or  pneumothorax.  Low lung volumes with resultant pulmonary interstitial prominence.  There may be mild pulmonary venous congestion.  No overt congestive failure. No lobar consolidation.  IMPRESSION: Cardiomegaly and low lung volumes.  Possible mild pulmonary venous congestion.  Decreased sensitivity and specificity exam due to technique related factors, as described above.  Original Report Authenticated By: Consuello Bossier, M.D.   Ct Maxillofacial  Ltd Wo Cm  05/15/2011  *RADIOLOGY REPORT*  Clinical Data:  Sinusitis.  CT LIMITED SINUSES WITHOUT CONTRAST  Technique:  Multidetector CT images of the paranasal sinuses were obtained in a single plane without contrast.  Comparison:  None available.  Findings:  The paranasal sinuses and mastoid air cells are clear. The nasal septum is midline.  No obstructing lesions are present. The mastoid air cells are clear.  IMPRESSION: Negative sinuses.  Original Report Authenticated By: Jamesetta Orleans. MATTERN, M.D.    Medications: Scheduled Meds:   . ALPRAZolam  1 mg Oral TID  . amLODipine  10 mg Oral Daily  . budesonide  0.25 mg Nebulization Q6H  . cloNIDine  0.3 mg Oral TID  . enoxaparin  40 mg Subcutaneous Q24H  . fluticasone  2 spray Each Nare Daily  . furosemide  40 mg Oral BID  . hydrALAZINE  25 mg Oral Q8H  . ipratropium  0.5 mg Nebulization Q6H  . levalbuterol  0.63 mg Nebulization Q6H  . losartan  100 mg Oral Daily  . methylPREDNISolone (SOLU-MEDROL) injection  40 mg Intravenous Q12H  . metoprolol tartrate  50 mg Oral BID  . moxifloxacin  400 mg Oral q1800  . pantoprazole  40 mg Oral BID AC  . pneumococcal 23 valent vaccine  0.5 mL Intramuscular Once  . sodium chloride  3 mL Intravenous Q12H  . sodium chloride  3 mL Intravenous Q12H  . traMADol  50 mg Oral Q6H  . vitamin A & D      . DISCONTD: albuterol  2.5 mg Nebulization Q4H  . DISCONTD: ALPRAZolam  0.5 mg Oral TID  . DISCONTD: amLODipine  5 mg Oral Daily  . DISCONTD: cloNIDine  0.3 mg Oral  BID  . DISCONTD: fluticasone  1 puff Inhalation BID  . DISCONTD: Fluticasone-Salmeterol  1 puff Inhalation BID  . DISCONTD: furosemide  20 mg Oral Daily  . DISCONTD: hydrochlorothiazide  12.5 mg Oral Daily  . DISCONTD: losartan  50 mg Oral Daily  . DISCONTD: methylPREDNISolone (SOLU-MEDROL) injection  60 mg Intravenous Q12H  . DISCONTD: metoprolol tartrate  25 mg Oral BID   Continuous Infusions:  PRN Meds:.sodium chloride, acetaminophen, acetaminophen, bisacodyl, guaiFENesin-dextromethorphan, hydrALAZINE, HYDROcodone-acetaminophen, menthol-cetylpyridinium, sodium chloride, zolpidem  Assessment/Plan:  Principal Problem:  *Asthma exacerbation in COPD Active Problems:  COPD (chronic obstructive pulmonary disease)  Respiratory distress  URI (upper respiratory infection)  Sinusitis acute  HTN (hypertension)  Morbid obesity  Manic, depressive  Anemia  Acute pulmonary edema  Asthma exacerbation in COPD: Little improvement in wheezing. Coughing remains tight, high pitched but less frequent. Appreciate pulmonary assistance.  Continue steroids.continue  02 support. Sats 98-100 on 3L.   Active Problems:  COPD (chronic obstructive pulmonary disease): see #1   URI (upper respiratory infection): wc WNL. Afebrile. Head congestion. Avelox day #5. Afebrile, non toxic.   HTN (hypertension): uncontrolled. SBP range 152-182, DBP range 76-112.  In last 24 hours Norvasc increased, losartan increased, HCTZ discontinued, lasix increased hydralazine scheduled and prn and  clonidine increased. BP taken left forearm 130/90. Will request BP readings q4hrs while awake.  BP cuffs fit poorly.   Will change prn hyd to q6hrs and adjust parameters.    Morbid obesity: Nutritional consult  Anemia: likely of chronic disease. Stable.  Dispo: home when medically stable.    LOS: 5 days   [facility] M 05/16/2011, 1:53 PM

## 2011-05-16 NOTE — Progress Notes (Signed)
Pt complains of a "funny feeling" on her right side radiating from her jaw to her arm. Pt states that it was a very quick sensation. VS taken. No changes on the tele monitor per monitor tech. Pt in NSR. Pt denies any pain or SOB @ this time, including chest pain. No noted weaknesses or deficit. Pronator drift negative and face symmetrical. Pt states, "Maybe it's from that Ambien or that I'm just tired. Pt agrees to notify nurse of any changes or new or recurring symptoms. Will monitor.

## 2011-05-16 NOTE — Progress Notes (Signed)
   CARE MANAGEMENT NOTE 05/16/2011  Patient:  Jenna Chandler, Jenna Chandler   Account Number:  [id number]  Date Initiated:  05/12/2011  Documentation initiated by:  [facility]  Subjective/Objective Assessment:   ADMITTED W/SOB. WU:[id number] OBESITY     Action/Plan:   FROM HOME W/FAMILY SUPPORT.INDEP PTA.[facility]-PCP/PHARMACY,HAS TRANSP.   Anticipated DC Date:  05/17/2011   Anticipated DC Plan:  HOME/SELF CARE      DC Planning Services  [facility] / [facility] (established/new)      Choice offered to / List presented to:             Status of service:  In process, will continue to follow Medicare Important Message given?   (If response is "NO", the following Medicare IM given date fields will be blank) Date Medicare IM given:   Date Additional Medicare IM given:    Discharge Disposition:    Per UR Regulation:  Reviewed for med. necessity/level of care/duration of stay  If discussed at Long Length of Stay Meetings, dates discussed:    Comments:  05/16/11 Valisa Karpel RN,BSN NCM [phone] [facility] FOLLOWING TO PROVIDE CASE MGR @ HOME TO ASST W/MED COMPLIANCE, & DZ MGMNT.  05/12/11 Signe Tackitt RN,BSN NCM [phone] ALREADY HAD [facility] APPT,  NOTED ON F/U.

## 2011-05-16 NOTE — Progress Notes (Signed)
Patient seen, examined and case discussed with my nurse practitioner.  Agree with above note and plan. Her patient still having some significant wheezing and elevated blood pressures this morning. She feels quite stressed about this. She feels like her reading is very labored. She also feels quite exhausted. I've also added Imdur to her regimen. We will continue to work to aggressively get her blood pressure down. Lasix has been started. Watching for output.

## 2011-05-17 DIAGNOSIS — J810 Acute pulmonary edema: Secondary | ICD-10-CM

## 2011-05-17 DIAGNOSIS — J45901 Unspecified asthma with (acute) exacerbation: Principal | ICD-10-CM

## 2011-05-17 LAB — BASIC METABOLIC PANEL
Calcium: 8.6 mg/dL (ref 8.4–10.5)
GFR calc non Af Amer: >90 mL/min (ref >90)
Glucose, Bld: 111 mg/dL — ABNORMAL HIGH (ref 70–99)
Sodium: 138 mEq/L (ref 135–145)

## 2011-05-17 MED ORDER — PREDNISONE 20 MG PO TABS
40.0000 mg | ORAL_TABLET | Freq: Every day | ORAL | Status: DC
  Administered 2011-05-18: 40 mg via ORAL
  Filled 2011-05-17 (×2): qty 2

## 2011-05-17 MED ORDER — METHYLPREDNISOLONE SODIUM SUCC 40 MG IJ SOLR: 40.0000 mg | INTRAMUSCULAR | Status: DC

## 2011-05-17 NOTE — Progress Notes (Signed)
Name: Jenna Chandler MRN: [id number] DOB: May 02, 1965    LOS: 6  PCCM PROGRESS NOTE  PCP - Tomasa Rand History of Present Illness: This is a 46 year old morbidly obese African American female who was admitted 4/4 with asthmatic bronchitic exacerbation and hypertension out of control. The patient's been aggressively treated for her asthma with bronchodilator therapy and corticosteroids. Despite this the patient has developed an incessant cough. The patient's cough is productive of bloody mucus. Quit smoking 2004 & was diagnosed with 'asthma' around the same time  Lines / Drains: none  Cultures: none  Antibiotics: avelox 4/5>>  Tests / Events: Echo: EF 65%  No wall motion or Valve abn.  Voice better, breathing better, was able to use cpap last night  Vital Signs: Temp:  [97.5 F (36.4 C)-98.4 F (36.9 C)] 98.4 F (36.9 C) (04/10 1030) Pulse Rate:  [61-78] 70  (04/10 1030) Resp:  [20-24] 20  (04/10 1030) BP: (131-163)/(71-97) 137/97 mmHg (04/10 1030) SpO2:  [94 %-100 %] 100 % (04/10 1030) Weight:  [392 lb 10.2 oz (178.1 kg)] 392 lb 10.2 oz (178.1 kg) (04/10 0502) I/O last 3 completed shifts: In: 1440 [P.O.:1440] Out: 2600 [Urine:2600]  Physical Examination: General:  obese African American female with decreased coughing Neuro:  Awake and alert, moves all fours   HEENT:  Mild jugular venous distention, no thyromegaly, neck supple, his membranes moist Neck:  Neck supple with minimal jugular venous distention   Cardiovascular:  Regular rate and rhythm without S3 normal S1-S2, no murmur rub heave or gallop Lungs:  Bibasilar rales, expired wheezes which are mostly pseudo- wheeze in nature.  Abdomen:  Protuberant, bowel sounds active, no organomegaly, no mass Musculoskeletal:  Full range of motion without joint deformity Skin:  Clear   Labs and Imaging:  .  Lab 05/17/11 0505 05/12/11 0220 05/11/11 1734  NA 138 138 138  K 4.2 3.7 --  CL 95* 100 101  CO2 37* 29 28    GLUCOSE 111* 133* 92  BUN 11 7 7   CREATININE 0.76 0.79 0.63  CALCIUM 8.6 8.9 8.8  MG -- 1.9 --  PHOS -- -- --    Lab 05/14/11 1700 05/12/11 0220 05/11/11 1734  HGB 11.2* 9.8* 10.3*  HCT 39.0 34.1* 35.2*  WBC 10.1 7.1 6.3  PLT 361 307 229    Lab 05/12/11 0220  AST 22  ALT 11  ALKPHOS 62  BILITOT 0.3  PROT 7.0  ALBUMIN 3.3*  INR --    Assessment and Plan: Patient Active Hospital Problem List: Asthmatic bronchitis/ exacerbation(05/11/2011)   Assessment: doubt significant COPD    Plan: solumedrol 40 q 24, pred in am 40 mg - slow taper over 2 weeks  add atrovent to  bronchodilator therapy,  continue oral Avelox x 5-7 ds              Respiratory distress (05/11/2011)   Assessment: Respiratory distress was due to asthmatic bronchitic exacerbation is slowly improving, however cyclical cough is exacerbating    Plan:  cyclic cough protocol in the hospital for cough suppression  4/9 dc powdered inhalers and use nebs exclusively              4/9 and bid ppi               4/10 reports frequency of cough is less but remains very intense when episodes occur   Sinusitis acute (05/11/2011)   Assessment: Concern that acute on chronic sinusitis may be driving the cough syndrome  in this case    Plan: We'll add nasal hygiene  CT scan  - Negative sinusitis.  HTN (hypertension) (05/11/2011)   Assessment: Hypertension is poorly controlled and I believe is aggravating the patient's symptom complex. I also feel this patient may have ongoing pulmonary edema contributing to the cough and hemoptysis    Plan: Defer to primary team, ON Losartan, hydralazine, and increased dosage of clonidine &Norvasc \ NO ACE inhibitor please  Hemoptysis    Hemoptysis likely was due to pulmonary edema and vascular congestion. Plan Does not appear to be any indication for bronchoscopy  OSA -emphasised CPAP use & effect on BP & asthma control She has a cpap machine at home, pl allow her to take mask & hose on  dc   Steve Minor ACNP [facility] PCCM Pager [phone] till 3 pm If no answer page [phone]  Independently examined pt, evaluated data & formulated above care plan with NP PCCM to sign off  Wilma Wuthrich V.

## 2011-05-17 NOTE — Progress Notes (Signed)
Subjective: Sitting in window seat eating lunch. Reports "I feel much better". No events during night  Objective: Vital signs Filed Vitals:   05/17/11 0232 05/17/11 0502 05/17/11 0852 05/17/11 1030  BP: 135/71 134/83  137/97  Pulse: 65 61  70  Temp: 98 F (36.7 C) 98.1 F (36.7 C)  98.4 F (36.9 C)  TempSrc: Oral Oral  Oral  Resp: 24 22  20   Height:      Weight:  178.1 kg (392 lb 10.2 oz)    SpO2: 98% 100% 95% 100%   Weight change: -1 kg (-2 lb 3.3 oz) Last BM Date: 05/16/11  Intake/Output from previous day: 04/09 0701 - 04/10 0700 In: 960 [P.O.:960] Out: 2600 [Urine:2600] Total I/O In: 360 [P.O.:360] Out: 900 [Urine:900]   Physical Exam: General: Alert, awake, oriented x3, in no acute distress. HEENT: No bruits, no goiter. PERRL, Mucus membranes mouth moist/pink.  Heart: Regular rate and rhythm, without murmurs, rubs, gallops. Trace LEE Lungs:  Only mild increased work of breathing with activity. Breath sound distant but clear to auscultation bilaterally. No wheeze. Abdomen:Morbidly obese.  Soft, nontender, nondistended, positive bowel sounds. Extremities: No clubbing cyanosis or edema with positive pedal pulses. Neuro: Grossly intact, nonfocal. Cranial nerve II-XII intact.    Lab Results: Basic Metabolic Panel:  Basename 05/17/11 0505  NA 138  K 4.2  CL 95*  CO2 37*  GLUCOSE 111*  BUN 11  CREATININE 0.76  CALCIUM 8.6  MG --  PHOS --   Liver Function Tests: No results found for this basename: AST:2,ALT:2,ALKPHOS:2,BILITOT:2,PROT:2,ALBUMIN:2 in the last 72 hours No results found for this basename: LIPASE:2,AMYLASE:2 in the last 72 hours No results found for this basename: AMMONIA:2 in the last 72 hours CBC:  Basename 05/14/11 1700  WBC 10.1  NEUTROABS --  HGB 11.2*  HCT 39.0  MCV 79.9  PLT 361   Cardiac Enzymes: No results found for this basename: CKTOTAL:3,CKMB:3,CKMBINDEX:3,TROPONINI:3 in the last 72 hours BNP: No results found for this  basename: PROBNP:3 in the last 72 hours D-Dimer: No results found for this basename: DDIMER:2 in the last 72 hours CBG: No results found for this basename: GLUCAP:6 in the last 72 hours Hemoglobin A1C: No results found for this basename: HGBA1C in the last 72 hours Fasting Lipid Panel: No results found for this basename: CHOL,HDL,LDLCALC,TRIG,CHOLHDL,LDLDIRECT in the last 72 hours Thyroid Function Tests: No results found for this basename: TSH,T4TOTAL,FREET4,T3FREE,THYROIDAB in the last 72 hours Anemia Panel: No results found for this basename: VITAMINB12,FOLATE,FERRITIN,TIBC,IRON,RETICCTPCT in the last 72 hours Coagulation: No results found for this basename: LABPROT:2,INR:2 in the last 72 hours Urine Drug Screen: Drugs of Abuse  No results found for this basename: labopia, cocainscrnur, labbenz, amphetmu, thcu, labbarb    Alcohol Level: No results found for this basename: ETH:2 in the last 72 hours Urinalysis: No results found for this basename: COLORURINE:2,APPERANCEUR:2,LABSPEC:2,PHURINE:2,GLUCOSEU:2,HGBUR:2,BILIRUBINUR:2,KETONESUR:2,PROTEINUR:2,UROBILINOGEN:2,NITRITE:2,LEUKOCYTESUR:2 in the last 72 hours Misc. Labs:  No results found for this or any previous visit (from the past 240 hour(s)).  Studies/Results: Dg Chest Port 1 View  05/16/2011  *RADIOLOGY REPORT*  Clinical Data: Pulmonary edema  PORTABLE CHEST - 1 VIEW  Comparison: Portable chest x-ray of 05/15/2011  Findings: The lungs are not quite as well aerated.  There is still a degree of pulmonary vascular congestion present.  The heart is within upper limits of normal in size.  IMPRESSION:   Diminished aeration.  Probable mild pulmonary vascular congestion.  Original Report Authenticated By: Juline Patch, M.D.   Dg Chest  Port 1 View  05/15/2011  *RADIOLOGY REPORT*  Clinical Data: Short of breath.  Pulmonary edema.  PORTABLE CHEST - 1 VIEW  Comparison: 05/12/2011  Findings: Mild cardiomegaly.  Mild right hemidiaphragm  elevation. Mildly degraded exam due to AP portable technique and patient body habitus.  No pleural effusion or pneumothorax.  Low lung volumes with resultant pulmonary interstitial prominence.  There may be mild pulmonary venous congestion.  No overt congestive failure. No lobar consolidation.  IMPRESSION: Cardiomegaly and low lung volumes.  Possible mild pulmonary venous congestion.  Decreased sensitivity and specificity exam due to technique related factors, as described above.  Original Report Authenticated By: Consuello Bossier, M.D.   Ct Maxillofacial  Ltd Wo Cm  05/15/2011  *RADIOLOGY REPORT*  Clinical Data:  Sinusitis.  CT LIMITED SINUSES WITHOUT CONTRAST  Technique:  Multidetector CT images of the paranasal sinuses were obtained in a single plane without contrast.  Comparison:  None available.  Findings:  The paranasal sinuses and mastoid air cells are clear. The nasal septum is midline.  No obstructing lesions are present. The mastoid air cells are clear.  IMPRESSION: Negative sinuses.  Original Report Authenticated By: Jamesetta Orleans. MATTERN, M.D.    Medications: Scheduled Meds:   . ALPRAZolam  1 mg Oral TID  . amLODipine  10 mg Oral Daily  . budesonide  0.25 mg Nebulization Q6H  . cloNIDine  0.3 mg Oral TID  . enoxaparin  40 mg Subcutaneous Q24H  . fluticasone  2 spray Each Nare Daily  . furosemide  40 mg Oral BID  . hydrALAZINE  25 mg Oral Q8H  . ipratropium  0.5 mg Nebulization Q6H  . isosorbide mononitrate  30 mg Oral Daily  . levalbuterol  0.63 mg Nebulization Q6H  . losartan  100 mg Oral Daily  . metoprolol tartrate  50 mg Oral BID  . moxifloxacin  400 mg Oral q1800  . pantoprazole  40 mg Oral BID AC  . pneumococcal 23 valent vaccine  0.5 mL Intramuscular Once  . predniSONE  40 mg Oral Q breakfast  . sodium chloride  3 mL Intravenous Q12H  . sodium chloride  3 mL Intravenous Q12H  . traMADol  50 mg Oral Q6H  . DISCONTD: methylPREDNISolone (SOLU-MEDROL) injection  40 mg  Intravenous Q12H  . DISCONTD: methylPREDNISolone (SOLU-MEDROL) injection  40 mg Intravenous Q24H   Continuous Infusions:  PRN Meds:.sodium chloride, acetaminophen, acetaminophen, bisacodyl, guaiFENesin-dextromethorphan, hydrALAZINE, HYDROcodone-acetaminophen, menthol-cetylpyridinium, sodium chloride, zolpidem, DISCONTD: hydrALAZINE  Assessment/Plan:  Principal Problem:  *Asthma exacerbation in COPD Active Problems:  COPD (chronic obstructive pulmonary disease)  Respiratory distress  URI (upper respiratory infection)  Sinusitis acute  HTN (hypertension)  Morbid obesity  Manic, depressive  Anemia  Acute pulmonary edema  Asthmatic/bronchitis/exacerbation : Much improved. Appreciate pulmonology assistance.  Coughing remains tight, high pitched but less frequent. Continue steroids. Will transition to po and start slow 2 week taper . continue 02 support. Sats 95-100 on 3L.  Active Problems:   COPD (chronic obstructive pulmonary disease): see #1   URI (upper respiratory infection): wc WNL. Afebrile. Head congestion. Avelox day #6. Afebrile, non toxic. 05/18/11 last dose.  HTN (hypertension):  Better controlled. SBP range 134-137 DBP range 71-97.  Is on 7 antihypertensives.    Morbid obesity: Nutritional consult  Anemia: likely of chronic disease. Stable.  Dispo: home when medically stable. Likely in am.    LOS: 6 days   [facility] M 05/17/2011, 12:55 PM  Pt seen and examined, agree with above Wean O2,  DC Avelox after 7 days, ambulatory sats before DC  Zannie Cove [facility] [phone]

## 2011-05-18 LAB — CREATININE, SERUM: GFR calc non Af Amer: >90 mL/min (ref >90)

## 2011-05-18 MED ORDER — LOSARTAN POTASSIUM 100 MG PO TABS: 100.0000 mg | ORAL_TABLET | Freq: Every day | ORAL | Status: DC

## 2011-05-18 MED ORDER — AMLODIPINE BESYLATE 10 MG PO TABS: 10.0000 mg | ORAL_TABLET | Freq: Every day | ORAL | Status: DC

## 2011-05-18 MED ORDER — CLONIDINE HCL 0.3 MG PO TABS: 0.3000 mg | ORAL_TABLET | Freq: Three times a day (TID) | ORAL | Status: DC

## 2011-05-18 MED ORDER — BUDESONIDE 0.25 MG/2ML IN SUSP: 0.2500 mg | Freq: Four times a day (QID) | RESPIRATORY_TRACT | Status: DC

## 2011-05-18 MED ORDER — TIOTROPIUM BROMIDE MONOHYDRATE 18 MCG IN CAPS: 18.0000 ug | ORAL_CAPSULE | Freq: Every day | RESPIRATORY_TRACT | Status: DC

## 2011-05-18 MED ORDER — ALPRAZOLAM 1 MG PO TABS: 1.0000 mg | ORAL_TABLET | Freq: Three times a day (TID) | ORAL | Status: AC

## 2011-05-18 MED ORDER — HYDRALAZINE HCL 50 MG PO TABS: 50.0000 mg | ORAL_TABLET | Freq: Three times a day (TID) | ORAL | Status: DC

## 2011-05-18 MED ORDER — FUROSEMIDE 40 MG PO TABS: 40.0000 mg | ORAL_TABLET | Freq: Two times a day (BID) | ORAL | Status: DC

## 2011-05-18 MED ORDER — TRAMADOL HCL 50 MG PO TABS: 50.0000 mg | ORAL_TABLET | Freq: Four times a day (QID) | ORAL | Status: AC

## 2011-05-18 MED ORDER — GUAIFENESIN-DM 100-10 MG/5ML PO SYRP: 5.0000 mL | ORAL_SOLUTION | ORAL | Status: AC | PRN

## 2011-05-18 MED ORDER — HYDRALAZINE HCL 50 MG PO TABS
50.0000 mg | ORAL_TABLET | Freq: Three times a day (TID) | ORAL | Status: DC
  Administered 2011-05-18: 50 mg via ORAL
  Filled 2011-05-18 (×6): qty 1

## 2011-05-18 MED ORDER — METOPROLOL TARTRATE 50 MG PO TABS: 50.0000 mg | ORAL_TABLET | Freq: Two times a day (BID) | ORAL | Status: DC

## 2011-05-18 MED ORDER — PREDNISONE 10 MG PO TABS: ORAL_TABLET | ORAL | Status: DC

## 2011-05-18 NOTE — Progress Notes (Signed)
   CARE MANAGEMENT NOTE 05/18/2011  Patient:  IA, LEEB   Account Number:  [id number]  Date Initiated:  05/12/2011  Documentation initiated by:  [facility]  Subjective/Objective Assessment:   ADMITTED W/SOB. HQ:IONGEX OBESITY     Action/Plan:   FROM HOME W/FAMILY SUPPORT.INDEP PTA.[facility]E-PCP/PHARMACY,HAS TRANSP.   Anticipated DC Date:  05/17/2011   Anticipated DC Plan:  HOME/SELF CARE      DC Planning Services  [facility] / [facility] (established/new)      Choice offered to / List presented to:  C-1 Patient   DME arranged  [facility]      DME agency  [facility]        Status of service:  Completed, signed off Medicare Important Message given?   (If response is "NO", the following Medicare IM given date fields will be blank) Date Medicare IM given:   Date Additional Medicare IM given:    Discharge Disposition:  HOME/SELF CARE  Per UR Regulation:  Reviewed for med. necessity/level of care/duration of stay  If discussed at Long Length of Stay Meetings, dates discussed:    Comments:  05/18/11 Grahm Etsitty RN,BSN NCM [phone] [facility] CONTACTED FOR HOME 02,SATS RA BELOW 88%.DOROTHY FROM [facility] AWARE OF D/C TODAY TO FOLLOW FOR COMMUNITY NURSE DISEASE MGMNT/NUTRITION.  05/16/11 Cord Wilczynski RN,BSN NCM [phone] [facility] FOLLOWING TO PROVIDE CASE MGR @ HOME TO ASST W/MED COMPLIANCE, & DZ MGMNT.  05/12/11 Broderic Bara RN,BSN NCM [phone] ALREADY HAD [facility] APPT,  NOTED ON F/U.

## 2011-05-18 NOTE — Progress Notes (Signed)
Checked oxygen saturation on RA while patient was sitting on the edge of the bed.  It ranged between 86-88% primarily staying at 88%.  While ambulating the patient in the hallway on RA, her oxygen saturation ranged from 85-91%.  She started dropping lower towards the end of the walk and felt winded.  Once back in the room, oxygen was applied at 1 L/M.   MD made aware of these findings.  Patient will be discharged home with oxygen for ambulating and at night (since patient wears CPAP).  Will continue to monitor oxygen saturations. Abdelaziz Westenberger, Joslyn Devon

## 2011-05-18 NOTE — Discharge Summary (Signed)
Physician Discharge Summary  Patient ID: MECHELE KITTLESON MRN: [id number] DOB/AGE: Nov 04, 1965 46 y.o.  Admit date: 05/11/2011 Discharge date: 05/18/2011  Primary Care Physician:  No primary provider on file. Has appointment [facility] 05/19/11  Discharge Diagnoses:    Active Problems:  COPD (chronic obstructive pulmonary disease)  Respiratory distress  HTN (hypertension)  Morbid obesity  Manic, depressive  Anemia   Medication List  As of 05/18/2011  2:17 PM   STOP taking these medications         beclomethasone 80 MCG/ACT inhaler      lisinopril-hydrochlorothiazide 10-12.5 MG per tablet      multivitamin with minerals tablet         TAKE these medications         albuterol 108 (90 BASE) MCG/ACT inhaler   Commonly known as: PROVENTIL HFA;VENTOLIN HFA   Inhale 2 puffs into the lungs every 6 (six) hours as needed. wheezing      ALPRAZolam 1 MG tablet   Commonly known as: XANAX   Take 1 tablet (1 mg total) by mouth 3 (three) times daily.      amLODipine 10 MG tablet   Commonly known as: NORVASC   Take 1 tablet (10 mg total) by mouth daily.      budesonide 0.25 MG/2ML nebulizer solution   Commonly known as: PULMICORT   Take 2 mLs (0.25 mg total) by nebulization every 6 (six) hours.      cloNIDine 0.3 MG tablet   Commonly known as: CATAPRES   Take 1 tablet (0.3 mg total) by mouth 3 (three) times daily.      CORICIDIN 2-325 MG Tabs   Generic drug: Chlorpheniramine-APAP   Take 2 tablets by mouth daily.      furosemide 40 MG tablet   Commonly known as: LASIX   Take 1 tablet (40 mg total) by mouth 2 (two) times daily.      guaiFENesin-dextromethorphan 100-10 MG/5ML syrup   Commonly known as: ROBITUSSIN DM   Take 5 mLs by mouth every 4 (four) hours as needed for cough.      hydrALAZINE 50 MG tablet   Commonly known as: APRESOLINE   Take 1 tablet (50 mg total) by mouth every 8 (eight) hours.      losartan 100 MG tablet   Commonly known as: COZAAR   Take 1  tablet (100 mg total) by mouth daily.      metoprolol 50 MG tablet   Commonly known as: LOPRESSOR   Take 1 tablet (50 mg total) by mouth 2 (two) times daily.      predniSONE 10 MG tablet   Commonly known as: DELTASONE   Take 4 tabs 4/12, 4/13, then take 3 tabs on 4/14, 4/15 and 4/16 then take 2 tabs on 4/17, 4/18 and 4/19, then take 1 tab 4/20, 4/21 and 4/22 then take 1/2 tab on 4/23, 4/24 and 4/25 then stop.      tiotropium 18 MCG inhalation capsule   Commonly known as: SPIRIVA   Place 1 capsule (18 mcg total) into inhaler and inhale daily.      traMADol 50 MG tablet   Commonly known as: ULTRAM   Take 1 tablet (50 mg total) by mouth every 6 (six) hours.             Disposition and Follow-up: Pt medically stable and ready for discharge to home. Will follow up with PCP 05/19/11. Will need oxygen for ambulation and at night. Will need OP  sleep study scheduled.   Consults:  pulmonary/intensive care   Physical Exam: General: Alert, awake, oriented x3, in no acute distress.  HEENT: No bruits, no goiter. PERRL, Mucus membranes mouth moist/pink.  Heart: Regular rate and rhythm, without murmurs, rubs, gallops. Trace LEE  Lungs: Only mild increased work of breathing with activity. Breath sound distant but clear to auscultation bilaterally. No wheeze.  Abdomen:Morbidly obese. Soft, nontender, nondistended, positive bowel sounds.  Extremities: No clubbing cyanosis or edema with positive pedal pulses.  Neuro: Grossly intact, nonfocal. Cranial nerve II-XII intact.       Significant Diagnostic Studies:  X-ray Chest Pa And Lateral   05/12/2011  *RADIOLOGY REPORT*  Clinical Data: Productive cough  CHEST - 2 VIEW  Comparison: 05/11/2011 and 02/26/2011  Findings: Heart size appears mildly enlarged and this is unchanged in comparison to the prior exam.  There has been interval resolution of the pulmonary vascular congestion seen on the prior exam.  No focal infiltrates or signs of congestive  failure are seen.  No pleural effusion is noted and a stable degree of mild central peribronchial cuffing is noted.  Bony structures appear intact.  IMPRESSION: Interval resolution of pulmonary vascular congestion.  Stable cardiac enlargement  Original Report Authenticated By: Bertha Stakes, M.D.   Dg Chest 2 View  05/11/2011  *RADIOLOGY REPORT*  Clinical Data: Shortness of breath and cough.  CHEST - 2 VIEW  Comparison: 02/26/2011.  Findings: The heart is mildly enlarged.  The mediastinal and hilar contours are slightly prominent but unchanged.  There is vascular congestion and possible mild interstitial edema.  No definite effusions or focal infiltrates.  The bony thorax is intact.  IMPRESSION: Cardiac enlargement with vascular congestion and probable mild interstitial edema.  Original Report Authenticated By: P. Loralie Champagne, M.D.    Labs Reviewed  CBC - Abnormal; Notable for the following:    Hemoglobin 10.3 (*)    HCT 35.2 (*)    MCH 23.0 (*)    MCHC 29.3 (*)    RDW 15.8 (*)    All other components within normal limits  PRO B NATRIURETIC PEPTIDE - Abnormal; Notable for the following:    Pro B Natriuretic peptide (BNP) 420.1 (*)    All other components within normal limits  CARDIAC PANEL(CRET KIN+CKTOT+MB+TROPI) - Abnormal; Notable for the following:    Total CK 869 (*)    CK, MB 12.0 (*)    All other components within normal limits  CARDIAC PANEL(CRET KIN+CKTOT+MB+TROPI) - Abnormal; Notable for the following:    Total CK 1105 (*)    CK, MB 11.5 (*) CRITICAL VALUE NOTED.  VALUE IS CONSISTENT WITH PREVIOUSLY REPORTED AND CALLED VALUE.   All other components within normal limits  CARDIAC PANEL(CRET KIN+CKTOT+MB+TROPI) - Abnormal; Notable for the following:    Total CK 1380 (*)    CK, MB 10.6 (*) CRITICAL VALUE NOTED.  VALUE IS CONSISTENT WITH PREVIOUSLY REPORTED AND CALLED VALUE.   All other components within normal limits  HEMOGLOBIN A1C - Abnormal; Notable for the following:     Hemoglobin A1C 6.7 (*)    Mean Plasma Glucose 146 (*)    All other components within normal limits  PRO B NATRIURETIC PEPTIDE - Abnormal; Notable for the following:    Pro B Natriuretic peptide (BNP) 546.9 (*)    All other components within normal limits  COMPREHENSIVE METABOLIC PANEL - Abnormal; Notable for the following:    Glucose, Bld 133 (*)    Albumin 3.3 (*)  All other components within normal limits  CBC - Abnormal; Notable for the following:    Hemoglobin 9.8 (*)    HCT 34.1 (*)    MCH 22.6 (*)    MCHC 28.7 (*)    RDW 15.8 (*)    All other components within normal limits  IRON AND TIBC - Abnormal; Notable for the following:    Iron 13 (*)    Saturation Ratios 4 (*)    All other components within normal limits  CBC - Abnormal; Notable for the following:    Hemoglobin 11.2 (*)    MCH 23.0 (*)    MCHC 28.7 (*)    RDW 16.0 (*)    All other components within normal limits  BASIC METABOLIC PANEL - Abnormal; Notable for the following:    Chloride 95 (*)    CO2 37 (*)    Glucose, Bld 111 (*)    All other components within normal limits  DIFFERENTIAL  BASIC METABOLIC PANEL  TSH  MAGNESIUM  VITAMIN B12  FOLATE  FERRITIN  RETICULOCYTES  OCCULT BLOOD X 1 CARD TO LAB, STOOL  CREATININE, SERUM  OCCULT BLOOD X 1 CARD TO LAB, STOOL  OCCULT BLOOD X 1 CARD TO LAB, STOOL  CREATININE, SERUM        X-ray Chest Pa And Lateral   05/12/2011  *RADIOLOGY REPORT*  Clinical Data: Productive cough  CHEST - 2 VIEW  Comparison: 05/11/2011 and 02/26/2011  Findings: Heart size appears mildly enlarged and this is unchanged in comparison to the prior exam.  There has been interval resolution of the pulmonary vascular congestion seen on the prior exam.  No focal infiltrates or signs of congestive failure are seen.  No pleural effusion is noted and a stable degree of mild central peribronchial cuffing is noted.  Bony structures appear intact.  IMPRESSION: Interval resolution of pulmonary  vascular congestion.  Stable cardiac enlargement  Original Report Authenticated By: Bertha Stakes, M.D.   Dg Chest 2 View  05/11/2011  *RADIOLOGY REPORT*  Clinical Data: Shortness of breath and cough.  CHEST - 2 VIEW  Comparison: 02/26/2011.  Findings: The heart is mildly enlarged.  The mediastinal and hilar contours are slightly prominent but unchanged.  There is vascular congestion and possible mild interstitial edema.  No definite effusions or focal infiltrates.  The bony thorax is intact.  IMPRESSION: Cardiac enlargement with vascular congestion and probable mild interstitial edema.  Original Report Authenticated By: P. Loralie Champagne, M.D.   Dg Chest Port 1 View  05/16/2011  *RADIOLOGY REPORT*  Clinical Data: Pulmonary edema  PORTABLE CHEST - 1 VIEW  Comparison: Portable chest x-ray of 05/15/2011  Findings: The lungs are not quite as well aerated.  There is still a degree of pulmonary vascular congestion present.  The heart is within upper limits of normal in size.  IMPRESSION:   Diminished aeration.  Probable mild pulmonary vascular congestion.  Original Report Authenticated By: Juline Patch, M.D.   Dg Chest Port 1 View  05/15/2011  *RADIOLOGY REPORT*  Clinical Data: Short of breath.  Pulmonary edema.  PORTABLE CHEST - 1 VIEW  Comparison: 05/12/2011  Findings: Mild cardiomegaly.  Mild right hemidiaphragm elevation. Mildly degraded exam due to AP portable technique and patient body habitus.  No pleural effusion or pneumothorax.  Low lung volumes with resultant pulmonary interstitial prominence.  There may be mild pulmonary venous congestion.  No overt congestive failure. No lobar consolidation.  IMPRESSION: Cardiomegaly and low lung volumes.  Possible  mild pulmonary venous congestion.  Decreased sensitivity and specificity exam due to technique related factors, as described above.  Original Report Authenticated By: Consuello Bossier, M.D.   Ct Maxillofacial  Ltd Wo Cm  05/15/2011  *RADIOLOGY REPORT*   Clinical Data:  Sinusitis.  CT LIMITED SINUSES WITHOUT CONTRAST  Technique:  Multidetector CT images of the paranasal sinuses were obtained in a single plane without contrast.  Comparison:  None available.  Findings:  The paranasal sinuses and mastoid air cells are clear. The nasal septum is midline.  No obstructing lesions are present. The mastoid air cells are clear.  IMPRESSION: Negative sinuses.  Original Report Authenticated By: Jamesetta Orleans. MATTERN, M.D.       Brief H and P: For complete details please refer to admission H and P, but in brief   Jenna Chandler is an 46 y.o. female who is morbidly obese with asthma and COPD presented to the ER on 05/11/11 with complaints of worsening SOB and wheezing. The patient reported that for the past several months she's been having difficulty with worsening asthma symptoms. She reported that she feels like she has fluid on her chest. She reported that she's coughing up yellow sputum. When she arrived in the ER she was having significant wheezing. She required multiple nebulizer treatments and steroids. She only had minimal improvement in symptoms after these treatments. She reported that she's coughing every few minutes. She was using accessory muscles to breathe when she initially arrived in the emergency department. She  had moderate improvement however she still had significant respiratory issues. Her chest x-ray was consistent with interstitial edema as well. She doesn't have a known history of congestive heart failure but has poorly controlled hypertension. She recently had some changes to her medication regimen for her hypertension. Hospital admission was requested so that she can have further asthma exacerbation treatment and to further clarify the cause of the interstitial edema and cardiomegaly on chest x-ray.   Hospital Course:   Asthmatic/bronchitis/exacerbation : Pt admitted to telemetry. Was provided with nebs, solumedrol, o2 support and cough  suppressant. She progressed very slowly and cough worsened.lasix was increased. Chest xray and CT of sinuses as above. 2decho yielded EF 65% and moderate LVH.  Seen by pulmonology 05/17/11 placed on cyclic cough protocol and improved. In addition powdered inhalers were discontinued and nebs used exclusively..  She responded favorabley .  Solumedrol  transitioned to po and slow 2 week taper at discharge. Pt ambulated on room air and  Oxygen saturation dropped to  88%. Will discharge on home o2. Pt has appointment 05/1211. Active Problems:  COPD (chronic obstructive pulmonary disease): see #1   URI (upper respiratory infection): wc WNL. Afebrile. Head congestion. CT sinuses as above. Completed 7 days avelox.  Remained afebrile, non toxic during this hospitalization.    HTN (hypertension): Uncontrolled on admission. Remained difficult to control. Home meds adjusted to max doses and additional antihypertensives added. At discharge SBP range 134-137 DBP range 71-97.  Will need close OP follow up for optimal control. Has appointment 05/1211 with PCP   Morbid obesity: Nutritional consult . Has been counseled regarding potential benefits of weight loss and impact on above problems.  Suspected OSA: Would likely benefit from sleep study. Recommend schedule via PCP.  Time spent on Discharge: 40 minutes  Signed: Gwenyth Bender 05/18/2011, 2:17 PM  Patient seen and examined agree with above Zannie Cove, MD [phone]

## 2011-05-18 NOTE — Plan of Care (Signed)
Problem: Phase II Progression Outcomes Goal: O2 sats > equal to 90% on RA or at baseline Outcome: Adequate for Discharge Patient will be discharged with oxygen to use during ambulating and hs (pt. Uses cpap at hs)

## 2011-06-01 ENCOUNTER — Other Ambulatory Visit: Payer: Self-pay

## 2011-06-01 ENCOUNTER — Encounter (HOSPITAL_COMMUNITY): Payer: Self-pay | Admitting: Emergency Medicine

## 2011-06-01 ENCOUNTER — Ambulatory Visit (INDEPENDENT_AMBULATORY_CARE_PROVIDER_SITE_OTHER): Payer: Self-pay | Admitting: Adult Health

## 2011-06-01 ENCOUNTER — Inpatient Hospital Stay (HOSPITAL_COMMUNITY)
Admission: EM | Admit: 2011-06-01 | Discharge: 2011-06-06 | DRG: 190 | Disposition: A | Discharge status: Home / Self Care | Payer: Medicaid Other | Attending: Internal Medicine | Admitting: Internal Medicine

## 2011-06-01 ENCOUNTER — Encounter: Payer: Self-pay | Admitting: Adult Health

## 2011-06-01 ENCOUNTER — Emergency Department (HOSPITAL_COMMUNITY): Payer: Medicaid Other

## 2011-06-01 VITALS — BP 144/100 | HR 98 | Temp 98.6°F | Ht 64.0 in | Wt 394.8 lb

## 2011-06-01 DIAGNOSIS — J449 Chronic obstructive pulmonary disease, unspecified: Secondary | ICD-10-CM | POA: Diagnosis present

## 2011-06-01 DIAGNOSIS — E662 Morbid (severe) obesity with alveolar hypoventilation: Secondary | ICD-10-CM

## 2011-06-01 DIAGNOSIS — J4489 Other specified chronic obstructive pulmonary disease: Secondary | ICD-10-CM | POA: Diagnosis present

## 2011-06-01 DIAGNOSIS — R799 Abnormal finding of blood chemistry, unspecified: Secondary | ICD-10-CM | POA: Diagnosis present

## 2011-06-01 DIAGNOSIS — R5383 Other fatigue: Secondary | ICD-10-CM | POA: Diagnosis present

## 2011-06-01 DIAGNOSIS — R7989 Other specified abnormal findings of blood chemistry: Secondary | ICD-10-CM | POA: Diagnosis present

## 2011-06-01 DIAGNOSIS — J441 Chronic obstructive pulmonary disease with (acute) exacerbation: Principal | ICD-10-CM | POA: Diagnosis present

## 2011-06-01 DIAGNOSIS — J180 Bronchopneumonia, unspecified organism: Secondary | ICD-10-CM | POA: Diagnosis present

## 2011-06-01 DIAGNOSIS — R5381 Other malaise: Secondary | ICD-10-CM | POA: Diagnosis present

## 2011-06-01 DIAGNOSIS — I10 Essential (primary) hypertension: Secondary | ICD-10-CM | POA: Diagnosis present

## 2011-06-01 DIAGNOSIS — I5032 Chronic diastolic (congestive) heart failure: Secondary | ICD-10-CM | POA: Diagnosis present

## 2011-06-01 DIAGNOSIS — Z6841 Body Mass Index (BMI) 40.0 and over, adult: Secondary | ICD-10-CM

## 2011-06-01 DIAGNOSIS — J189 Pneumonia, unspecified organism: Secondary | ICD-10-CM

## 2011-06-01 DIAGNOSIS — I214 Non-ST elevation (NSTEMI) myocardial infarction: Secondary | ICD-10-CM

## 2011-06-01 DIAGNOSIS — Z87891 Personal history of nicotine dependence: Secondary | ICD-10-CM

## 2011-06-01 DIAGNOSIS — G4733 Obstructive sleep apnea (adult) (pediatric): Secondary | ICD-10-CM | POA: Diagnosis present

## 2011-06-01 DIAGNOSIS — F319 Bipolar disorder, unspecified: Secondary | ICD-10-CM | POA: Diagnosis present

## 2011-06-01 DIAGNOSIS — D649 Anemia, unspecified: Secondary | ICD-10-CM | POA: Diagnosis present

## 2011-06-01 DIAGNOSIS — I509 Heart failure, unspecified: Secondary | ICD-10-CM | POA: Diagnosis present

## 2011-06-01 HISTORY — DX: Fibromyalgia: M79.7

## 2011-06-01 HISTORY — DX: Cardiomegaly: I51.7

## 2011-06-01 LAB — POCT I-STAT TROPONIN I: Troponin i, poc: 0.17 ng/mL (ref 0.00–0.08)

## 2011-06-01 LAB — BASIC METABOLIC PANEL
BUN: 10 mg/dL (ref 6–23)
CO2: 33 mEq/L — ABNORMAL HIGH (ref 19–32)
Chloride: 96 mEq/L (ref 96–112)
GFR calc non Af Amer: >90 mL/min (ref >90)
Glucose, Bld: 79 mg/dL (ref 70–99)
Potassium: 3.9 mEq/L (ref 3.5–5.1)
Sodium: 137 mEq/L (ref 135–145)

## 2011-06-01 LAB — CBC
HCT: 40.2 % (ref 36.0–46.0)
Hemoglobin: 11.4 g/dL — ABNORMAL LOW (ref 12.0–15.0)
MCHC: 28.4 g/dL — ABNORMAL LOW (ref 30.0–36.0)
RBC: 4.95 MIL/uL (ref 3.87–5.11)
WBC: 8.5 10*3/uL (ref 4.0–10.5)

## 2011-06-01 MED ORDER — DEXTROSE 5 % IV SOLN
500.0000 mg | Freq: Once | INTRAVENOUS | Status: DC
  Administered 2011-06-01: 500 mg via INTRAVENOUS
  Filled 2011-06-01: qty 500

## 2011-06-01 MED ORDER — DEXTROSE 5 % IV SOLN
1.0000 g | Freq: Once | INTRAVENOUS | Status: AC
  Administered 2011-06-01: 1 g via INTRAVENOUS
  Filled 2011-06-01: qty 10

## 2011-06-01 MED ORDER — HEPARIN BOLUS VIA INFUSION: 4000.0000 [IU] | Freq: Once | INTRAVENOUS | Status: DC

## 2011-06-01 MED ORDER — METHYLPREDNISOLONE SODIUM SUCC 125 MG IJ SOLR
60.0000 mg | Freq: Two times a day (BID) | INTRAMUSCULAR | Status: DC
Start: 2011-06-01 — End: 2011-06-03
  Administered 2011-06-01 – 2011-06-03 (×4): 60 mg via INTRAVENOUS
  Filled 2011-06-01 (×2): qty 0.96
  Filled 2011-06-01: qty 2
  Filled 2011-06-01 (×2): qty 0.96

## 2011-06-01 MED ORDER — PREDNISONE 10 MG PO TABS: ORAL_TABLET | ORAL | Status: DC

## 2011-06-01 MED ORDER — LEVOFLOXACIN 500 MG PO TABS: 500.0000 mg | ORAL_TABLET | Freq: Every day | ORAL | Status: DC

## 2011-06-01 MED ORDER — HEPARIN (PORCINE) IN NACL 100-0.45 UNIT/ML-% IJ SOLN: 1000.0000 [IU]/h | INTRAMUSCULAR | Status: DC

## 2011-06-01 NOTE — H&P (Signed)
PCP:  No primary provider on file.   DOA:  06/01/2011  5:52 PM  Chief Complaint:  Shortness of breath  HPI: 23 year olf female with multiple co morbidities including but not limited to asthma, htn, morbid obesity who presented with complaints of progressively worsening shortness of breath of few days duration associated with productive cough and subjective fever. Patient also reported intermittent retrosternal chest pain, pressure like that come during rest, 7/10 in intensity and lasts only few seconds when it comes.  Also patient reported occasionally periods of somnolence. She has been treated and subsequently discharged recently 05/18/11 for similar symptoms. Patient denies abdominal pain, nausea and vomiting. Patient denies headaches and dizziness and loss of consciousness. No reports of sick contacts at home.   Assessment/Plan  Principal Problem:   *COPD with acute exacerbation - we will continue treatment with nebulizers Q 2 hours PRN to control shortness of breath and wheezing - provide oxygen support via nasal canula to keep O2 saturation above 90% - continue CPAP at night (perhaps non compliance with CPAP is causing intermittent periods of somnolence) - we will start treatment for possible bronchopneumonia with azithromycin and ceftriaxone - start solumedrol 60 mg Q 12 hours IV  Active Problems:   HTN (hypertension) - BP 168/93; remains relatively poorly controlled - initiate home medication regimen which includes cozaar, hydralazine, metoprolol and clonidine - telemetry monitoring   Morbid obesity - nutrition consult   Manic, depressive - continue xanax per home regimen   Anemia - perhaps secondary to chronic disease, CHF - hemoglobin stable on admission   Diastolic CHF, chronic - mild with BNP in 170's - 2 D ECHO done earlier this month 05/2011 with EF 65% - continue lasix 40 mg daily PO - daily weight - strict intake and output - continue metoprolol    Bronchopneumonia - clinically and based on imaging studies - started azithromycin and ceftriaxone   Elevated troponin - as per ED attending Dr. Weldon Inches, Dr. Jones Broom of cardiology was notified about first troponin being 0.17; per cardio this is very mild elevation and considering the second troponin was within normal limits this likely represents strain on heart but not significant that it warrants anticoagulation; per ED attending cardio available for consult should any concerns arise  DVT Prophylaxis - lovenox subQ  Code Status - full code  Education  - test results and diagnostic studies were discussed with patient  - patient  verbalized the understanding - questions were answered at the bedside and contact information was provided for additional questions or concerns    Allergies: No Known Allergies  Prior to Admission medications   Medication Sig Start Date End Date Taking? Authorizing Provider  albuterol (PROVENTIL HFA;VENTOLIN HFA) 108 (90 BASE) MCG/ACT inhaler Inhale 2 puffs into the lungs every 6 (six) hours as needed. wheezing    Yes Historical Provider, MD  ALPRAZolam (XANAX) 1 MG tablet Take 1 tablet (1 mg total) by mouth 3 (three) times daily. 05/18/11 06/17/11 Yes Lesle Chris Black, NP  cloNIDine (CATAPRES) 0.3 MG tablet Take 1 tablet (0.3 mg total) by mouth 3 (three) times daily. 05/18/11 05/17/12 Yes Lesle Chris Black, NP  furosemide (LASIX) 40 MG tablet Take 1 tablet (40 mg total) by mouth 2 (two) times daily. 05/18/11 05/17/12 Yes Lesle Chris Black, NP  hydrALAZINE (APRESOLINE) 50 MG tablet Take 1 tablet (50 mg total) by mouth every 8 (eight) hours. 05/18/11 05/17/12 Yes Lesle Chris Black, NP  losartan (COZAAR) 100 MG tablet Take 1 tablet (  100 mg total) by mouth daily. 05/18/11 05/17/12 Yes Lesle Chris Black, NP  metoprolol (LOPRESSOR) 50 MG tablet Take 50 mg by mouth 2 (two) times daily.  05/18/11 05/17/12 Yes Lesle Chris Black, NP  traMADol (ULTRAM) 50 MG tablet Take 50 mg by mouth every 8 (eight)  hours as needed. For pain.   Yes Historical Provider, MD    Past Medical History  Diagnosis Date  . Asthma   . Hypertension   . COPD (chronic obstructive pulmonary disease)   . Obesity   . Manic, depressive   . Enlarged heart   . Fibromyalgia     No past surgical history on file.  Social History:  reports that she quit smoking about 9 years ago. Her smoking use included Cigarettes. She has a 30 pack-year smoking history. She does not have any smokeless tobacco history on file. She reports that she does not drink alcohol or use illicit drugs.  No family history on file.  Review of Systems:  Constitutional: Denies fever, chills, diaphoresis, appetite change and fatigue.  HEENT: Denies photophobia, eye pain, redness, hearing loss, ear pain, congestion, sore throat, rhinorrhea, sneezing, mouth sores, trouble swallowing, neck pain, neck stiffness and tinnitus.   Respiratory: per hpi.   Cardiovascular: (+) chest pain, (-) palpitations and leg swelling.  Gastrointestinal: Denies nausea, vomiting, abdominal pain, diarrhea, constipation, blood in stool and abdominal distention.  Genitourinary: Denies dysuria, urgency, frequency, hematuria, flank pain and difficulty urinating.  Musculoskeletal: Denies myalgias, back pain, joint swelling, arthralgias and gait problem.  Skin: Denies pallor, rash and wound.  Neurological: Denies dizziness, seizures, syncope, weakness, light-headedness, numbness and headaches.  Hematological: Denies adenopathy. Easy bruising, personal or family bleeding history  Psychiatric/Behavioral: Denies suicidal ideation, mood changes, confusion, nervousness, sleep disturbance and agitation   Physical Exam:  Filed Vitals:   06/01/11 1749 06/01/11 2028  BP: 157/107 168/93  Pulse: 93 94  Temp: 100.1 F (37.8 C) 99.7 F (37.6 C)  TempSrc: Oral Oral  Resp: 19 20  SpO2: 100% 100%    Constitutional: Vital signs reviewed.  Patient is in no acute distress and  cooperative with exam. Alert and oriented x3.  Head: Normocephalic and atraumatic Ear: TM normal bilaterally Mouth: no erythema or exudates, MMM Eyes: PERRL, EOMI, conjunctivae normal, No scleral icterus.  Neck: Supple, Trachea midline normal ROM, No JVD, mass, thyromegaly, or carotid bruit present.  Cardiovascular: RRR, S1 normal, S2 normal, no MRG, pulses symmetric and intact bilaterally Pulmonary/Chest: wheezing appreciated, bilateral air entry Abdominal: Soft. Non-tender, non-distended, bowel sounds are normal, no masses, organomegaly, or guarding present.  GU: no CVA tenderness Musculoskeletal: No joint deformities, erythema, or stiffness, ROM full and no nontender Ext: no edema and no cyanosis, pulses palpable bilaterally (DP and PT) Hematology: no cervical, inginal, or axillary adenopathy.  Neurological: A&O x3, Strenght is normal and symmetric bilaterally, cranial nerve II-XII are grossly intact, no focal motor deficit, sensory intact to light touch bilaterally.  Skin: Warm, dry and intact. No rash, cyanosis, or clubbing.  Psychiatric: Normal mood and affect. speech and behavior is normal. Judgment and thought content normal. Cognition and memory are normal.   Labs on Admission:  Results for orders placed during the hospital encounter of 06/01/11 (from the past 48 hour(s))  CBC     Status: Abnormal   Collection Time   06/01/11  6:50 PM      Component Value Range Comment   WBC 8.5  4.0 - 10.5 (K/uL)    RBC 4.95  3.87 -  5.11 (MIL/uL)    Hemoglobin 11.4 (*) 12.0 - 15.0 (g/dL)    HCT 16.1  09.6 - 04.5 (%)    MCV 81.2  78.0 - 100.0 (fL)    MCH 23.0 (*) 26.0 - 34.0 (pg)    MCHC 28.4 (*) 30.0 - 36.0 (g/dL)    RDW 40.9 (*) 81.1 - 15.5 (%)    Platelets 218  150 - 400 (K/uL)   BASIC METABOLIC PANEL     Status: Abnormal   Collection Time   06/01/11  6:50 PM      Component Value Range Comment   Sodium 137  135 - 145 (mEq/L)    Potassium 3.9  3.5 - 5.1 (mEq/L)    Chloride 96  96 - 112  (mEq/L)    CO2 33 (*) 19 - 32 (mEq/L)    Glucose, Bld 79  70 - 99 (mg/dL)    BUN 10  6 - 23 (mg/dL)    Creatinine, Ser 9.14  0.50 - 1.10 (mg/dL)    Calcium 9.0  8.4 - 10.5 (mg/dL)    GFR calc non Af Amer >90  >90 (mL/min)    GFR calc Af Amer >90  >90 (mL/min)   PRO B NATRIURETIC PEPTIDE     Status: Abnormal   Collection Time   06/01/11  6:55 PM      Component Value Range Comment   Pro B Natriuretic peptide (BNP) 171.8 (*) 0 - 125 (pg/mL)   POCT I-STAT TROPONIN I     Status: Abnormal   Collection Time   06/01/11  7:00 PM      Component Value Range Comment   Troponin i, poc 0.17 (*) 0.00 - 0.08 (ng/mL)    Comment NOTIFIED PHYSICIAN      Comment 3            POCT I-STAT TROPONIN I     Status: Normal   Collection Time   06/01/11  9:30 PM      Component Value Range Comment   Troponin i, poc 0.03  0.00 - 0.08 (ng/mL)    Comment 3              Radiological Exams on Admission: No results found.  Time Spent on Admission: Over 30 minutes  Malic Rosten 06/01/2011, 10:12 PM  [facility] Pager # [phone] Main Office # [phone]

## 2011-06-01 NOTE — ED Notes (Signed)
Critical value shown to Dr. Roselind Rily

## 2011-06-01 NOTE — ED Notes (Signed)
Pt gone to the restroom. Will get labs when pt returns.

## 2011-06-01 NOTE — Progress Notes (Signed)
Subjective:    Patient ID: Jenna Chandler, female    DOB: Feb 15, 1965, 46 y.o.   MRN: [id number]  HPI 46 year old, morbidly, obese, female, with a known history of asthma, and COPD, and a former smoker. Seen for initial pulmonary consult 05/15/2011  06/01/2011 Acute OV   patient was admitted April 4 through 05/18/2011 for acute respiratory distress asthmatic bronchitic exacerbation and uncontrolled hypertension. Pulmonary critical care was consulted and seen patient on 05/15/2011 with recommendations for aggressive pulmonary hygiene, IV steroids, cyclical cough regimen. 2-D echo showed ejection fracture 65% with moderate left ventricular hypertrophy. . CT scan of her sinuses was negative. Patient was discharged on slow prednisone taper and O2 for persistent desaturations . Her ACE inhibitor was stopped. The patient was recommended to begin on budesonide nebulizers twice daily along with Spiriva.  Patient presents today for worsening shortness of breath, cough, congestion, and sleepiness. She was seen in her cardiologist this morning and recommended to be worked into the pulmonary clinic. This afternoon. Planes that over the last week. Her cough has worsened with increased congestion, thick, yellow mucus, shortness of breath, wheezing, dyspnea on exertion. Complains that she falls asleep very easily and has been very lethargic for the last few days. Patient has not begun her budesonide nebulizers due to lack of financial and insurance support. [facility] is [facility] at [facility] and depends on them for her medications. Patient does report that she was recently told to start CPAP however, she just received her machine and mask today. She denies any hemoptysis, chest pain, abdominal pain, or vomiting. Patient has had increased lower ext.  swelling.   Review of Systems Constitutional:   No  weight loss, night sweats,  Fevers, chills, fatigue, or  lassitude.  HEENT:   No headaches,  Difficulty swallowing,   Tooth/dental problems, or  Sore throat,                No sneezing, itching, ear ache, nasal congestion, post nasal drip,   CV:  No chest pain,  Orthopnea, PND,  anasarca, dizziness, palpitations, syncope.   GI  No heartburn, indigestion, abdominal pain, nausea, vomiting, diarrhea, change in bowel habits, loss of appetite, bloody stools.   Resp:    No coughing up of blood.     No chest wall deformity  Skin: no rash or lesions.  GU: no dysuria, change in color of urine, no urgency or frequency.  No flank pain, no hematuria   MS:  No joint pain or swelling.  No decreased range of motion.  No back pain.  Psych:  No change in mood or affect. No depression or anxiety.  No memory loss.         Objective:   Physical Exam GEN: A/Ox3; NAD, morbidly obese , lethargic   HEENT:  [place]/AT,  EACs-clear, TMs-wnl, NOSE-clear, THROAT-mild redness  no lesions, no postnasal drip or exudate noted.   NECK:  Supple w/ fair ROM; no JVD; normal carotid impulses w/o bruits; no thyromegaly or nodules palpated; no lymphadenopathy.  RESP  Coarse BS w/ upper airway loud psuedowheezing, tachpneic no accessory muscle use, no dullness to percussion  CARD:  RRR, no m/r/g  , 1+ peripheral edema, pulses intact, no cyanosis or clubbing.  GI:   Soft & nt; nml bowel sounds; no organomegaly or masses detected.  Musco: Warm bil, no deformities or joint swelling noted.   Neuro: alert, no focal deficits noted.    Skin: Warm, no lesions or rashes  Assessment & Plan:

## 2011-06-01 NOTE — ED Notes (Signed)
Pt states she was here 2 weeks ago and was diagnosed with "fluid on her heart." Came in today for SOB and CP. Started this morning at 10a

## 2011-06-01 NOTE — ED Provider Notes (Signed)
History     CSN: [id number]  Arrival date & time 06/01/11  1747   First MD Initiated Contact with Patient 06/01/11 1821      Chief Complaint  Patient presents with  . Shortness of Breath  . Chest Pain    (Consider location/radiation/quality/duration/timing/severity/associated sxs/prior treatment) Patient is a 46 y.o. female presenting with shortness of breath and chest pain. The history is provided by the patient and a relative.  Shortness of Breath  Associated symptoms include chest pain, cough and shortness of breath. Pertinent negatives include no fever.  Chest Pain Primary symptoms include shortness of breath and cough. Pertinent negatives for primary symptoms include no fever, no nausea and no vomiting.    the patient is a 46 year old, morbidly obese female, who has oxygen-dependent COPD, who presents to emergency department for evaluation of cough with productive sputum, waxing and waning somnolence, and brief, intermittent, sharp, chest pain.  She denies fevers, chills, sweating.  She no longer smokes cigarettes.  She denies chest pain.  At this time.  When she does have a chest pain.  It is sharp and stabbing, and only lasts a few seconds.  She was seen by Dr. Eliott Nine today, and then sent to the pulmonologist for evaluation.  The pulmonologist saw her and recommended.  She comes to the emergency department for further evaluation.  Past Medical History  Diagnosis Date  . Asthma   . Hypertension   . COPD (chronic obstructive pulmonary disease)   . Obesity   . Manic, depressive   . Enlarged heart   . Fibromyalgia     No past surgical history on file.  No family history on file.  History  Substance Use Topics  . Smoking status: Former Smoker -- 2.0 packs/day for 15 years    Types: Cigarettes    Quit date: 05/24/2002  . Smokeless tobacco: Not on file  . Alcohol Use: No    OB History    Grav Para Term Preterm Abortions TAB SAB Ect Mult Living                    Review of Systems  Constitutional: Negative for fever and chills.  Respiratory: Positive for cough and shortness of breath. Negative for chest tightness.   Cardiovascular: Positive for chest pain.       Chest pain is sharp, stabbing, and fleeting  Gastrointestinal: Negative for nausea and vomiting.  Neurological: Negative for headaches.  Psychiatric/Behavioral: Positive for confusion.       Both she and her daughter state that.  She has episodes of confusion and drowsiness.  She sleeping all the time, and falls asleep in the middle of the day  All other systems reviewed and are negative.    Allergies  Review of patient's allergies indicates no known allergies.  Home Medications   Current Outpatient Rx  Name Route Sig Dispense Refill  . ALBUTEROL SULFATE HFA 108 (90 BASE) MCG/ACT IN AERS Inhalation Inhale 2 puffs into the lungs every 6 (six) hours as needed. wheezing     . ALPRAZOLAM 1 MG PO TABS Oral Take 1 tablet (1 mg total) by mouth 3 (three) times daily. 30 tablet 0  . CLONIDINE HCL 0.3 MG PO TABS Oral Take 1 tablet (0.3 mg total) by mouth 3 (three) times daily. 90 tablet 0  . FUROSEMIDE 40 MG PO TABS Oral Take 1 tablet (40 mg total) by mouth 2 (two) times daily. 960 tablet 0  . HYDRALAZINE HCL 50  MG PO TABS Oral Take 1 tablet (50 mg total) by mouth every 8 (eight) hours. 90 tablet 0  . LOSARTAN POTASSIUM 100 MG PO TABS Oral Take 1 tablet (100 mg total) by mouth daily. 30 tablet 0  . METOPROLOL TARTRATE 50 MG PO TABS Oral Take 50 mg by mouth 2 (two) times daily.     . TRAMADOL HCL 50 MG PO TABS Oral Take 50 mg by mouth every 8 (eight) hours as needed. For pain.      BP 168/93  Pulse 94  Temp(Src) 99.7 F (37.6 C) (Oral)  Resp 20  SpO2 100%  LMP 04/28/2011  Physical Exam  Vitals reviewed. Constitutional: She is oriented to person, place, and time.       Morbidly obese  HENT:  Head: Normocephalic and atraumatic.  Eyes: Conjunctivae are normal.  Neck: Normal range  of motion. Neck supple.  Cardiovascular: Normal rate and regular rhythm.   Pulmonary/Chest: Effort normal. She has no wheezes. She has no rales.  Abdominal: Soft. There is no tenderness.  Neurological: She is alert and oriented to person, place, and time.  Skin: Skin is warm and dry.  Psychiatric: She has a normal mood and affect. Thought content normal.       Drowsy    ED Course  Procedures (including critical care time) Morbidly obese.  Patient with productive cough, and shortness of breath, and episodes of confusion and drowsiness. She has no chest pain.  Now.  We will perform a chest x-ray, and laboratory testing.  I suspect that she.pickwickian syndrome, and since she was sent here from the cardiologist, and pulmonologist, and then to the emergency department.  We will admit her to the hospital for further evaluate  Labs Reviewed  CBC - Abnormal; Notable for the following:    Hemoglobin 11.4 (*)    MCH 23.0 (*)    MCHC 28.4 (*)    RDW 16.9 (*)    All other components within normal limits  BASIC METABOLIC PANEL - Abnormal; Notable for the following:    CO2 33 (*)    All other components within normal limits  PRO B NATRIURETIC PEPTIDE - Abnormal; Notable for the following:    Pro B Natriuretic peptide (BNP) 171.8 (*)    All other components within normal limits  POCT I-STAT TROPONIN I - Abnormal; Notable for the following:    Troponin i, poc 0.17 (*)    All other components within normal limits  BLOOD GAS, ARTERIAL   Dg Chest Port 1 View  06/01/2011  *RADIOLOGY REPORT*  Clinical Data: Chest pain and cough.  PORTABLE CHEST - 1 VIEW  Comparison: Chest x-ray 05/16/2011.  Findings: The heart is mildly enlarged (unchanged).  There is cephalization of the pulmonary vasculature and slight indistinctness of the interstitial markings suggestive of mild pulmonary edema.  No definite pleural effusions.  Mediastinal contours are unremarkable.  IMPRESSION: 1.  Findings, as above, most  suggestive of mild congestive heart failure.  Strictly speaking, superimposed multifocal infection such as severe bronchitis or early multilobar bronchopneumonia cannot be entirely excluded, however, this is not strongly favored.  Clinical correlation is recommended.  Original Report Authenticated By: Florencia Reasons, M.D.     No diagnosis found.  ED ECG REPORT   ECG ECG time.  1745 Normal sinus rhythm with a rate of 92 Normal axis Normal intervals Nonspecific T wave changes in lead 3, aVF, and V6, which are different from prior EKG    8:58 PM  Spoke with dr. Clarise Cruz.  He will consult He said he would not even put on heparin     9:58 PM Spoke with dr. Elisabeth Pigeon. [facility] will admit.  CRITICAL CARE Performed by: Nicholes Stairs   Total critical care time: 30 min  Critical care time was exclusive of separately billable procedures and treating other patients.  Critical care was necessary to treat or prevent imminent or life-threatening deterioration.  Critical care was time spent personally by me on the following activities: development of treatment plan with patient and/or surrogate as well as nursing, discussions with consultants, evaluation of patient's response to treatment, examination of patient, obtaining history from patient or surrogate, ordering and performing treatments and interventions, ordering and review of laboratory studies, ordering and review of radiographic studies, pulse oximetry and re-evaluation of patient's condition.  MDM  Non-stemi Atypical pneumonia with fever, ra hypoxia Pickwickian syndrome        Cheri Guppy, MD 06/01/11 2159

## 2011-06-01 NOTE — Patient Instructions (Addendum)
We fill you need to go to the emergency room for evaluation immediately  You have declined EMS transportation , please go directly to ER for evaluation  follow up Dr. Delford Field  In 2 weeks as planned and As needed  Please begin CPAP At bedtime   Please take your Budesonide Neb Twice daily   Continue on Spiriva daily  Please contact office for sooner follow up if symptoms do not improve or worsen or seek emergency care

## 2011-06-01 NOTE — Assessment & Plan Note (Signed)
Slow to resolve exacerbation complicated by possible OSA/OHS, pt has increased lethargy in the office . Also may have component of volume overload with peripheral edema. She has not started her maintence medications which may be contributing to her decompensation .  It is unclear her exact baseline.  Case discussed in detail with Dr. Maple Hudson  With recommendations to refer pt to ER for further evaluation  If she is not admitted have advised her to pick up her pulmicort neb from [facility] tomorrow to begin.  Begin CPAP immediately as directed.  follow up Dr. Delford Field  In 2 weeks and As needed   She has declined EMS transport and daughter to take in private vehicle.  Initially abx /steroids sent to pharmacy -this was voided due to referral to ER.

## 2011-06-02 ENCOUNTER — Encounter (HOSPITAL_COMMUNITY): Payer: Self-pay | Admitting: *Deleted

## 2011-06-02 DIAGNOSIS — R5383 Other fatigue: Secondary | ICD-10-CM | POA: Diagnosis present

## 2011-06-02 DIAGNOSIS — G4733 Obstructive sleep apnea (adult) (pediatric): Secondary | ICD-10-CM | POA: Diagnosis present

## 2011-06-02 LAB — CARDIAC PANEL(CRET KIN+CKTOT+MB+TROPI)
CK, MB: 2.5 ng/mL (ref 0.3–4.0)
CK, MB: 3.8 ng/mL (ref 0.3–4.0)
Relative Index: INVALID (ref 0.0–2.5)
Total CK: 77 U/L (ref 7–177)
Total CK: 71 U/L (ref 7–177)

## 2011-06-02 LAB — CBC
HCT: 36.1 % (ref 36.0–46.0)
Hemoglobin: 10.4 g/dL — ABNORMAL LOW (ref 12.0–15.0)
MCH: 23.2 pg — ABNORMAL LOW (ref 26.0–34.0)
MCHC: 28.8 g/dL — ABNORMAL LOW (ref 30.0–36.0)

## 2011-06-02 LAB — BLOOD GAS, ARTERIAL
Acid-Base Excess: 6.6 mmol/L — ABNORMAL HIGH (ref 0.0–2.0)
Drawn by: 307971
O2 Content: 2 L/min
TCO2: 28.8 mmol/L (ref 0–100)
pCO2 arterial: 49.8 mmHg — ABNORMAL HIGH (ref 35.0–45.0)
pO2, Arterial: 100 mmHg (ref 80.0–100.0)

## 2011-06-02 LAB — TSH: TSH: 1.041 u[IU]/mL (ref 0.350–4.500)

## 2011-06-02 LAB — COMPREHENSIVE METABOLIC PANEL
ALT: 18 U/L (ref 0–35)
Albumin: 3 g/dL — ABNORMAL LOW (ref 3.5–5.2)
Alkaline Phosphatase: 62 U/L (ref 39–117)
Chloride: 94 mEq/L — ABNORMAL LOW (ref 96–112)
GFR calc Af Amer: >90 mL/min (ref >90)
Glucose, Bld: 182 mg/dL — ABNORMAL HIGH (ref 70–99)
Potassium: 3.7 mEq/L (ref 3.5–5.1)
Sodium: 134 mEq/L — ABNORMAL LOW (ref 135–145)
Total Protein: 6.5 g/dL (ref 6.0–8.3)

## 2011-06-02 LAB — PRO B NATRIURETIC PEPTIDE: Pro B Natriuretic peptide (BNP): 223.7 pg/mL — ABNORMAL HIGH (ref 0–125)

## 2011-06-02 LAB — PROTIME-INR: INR: 1.06 (ref 0.00–1.49)

## 2011-06-02 LAB — DIFFERENTIAL
Lymphocytes Relative: 3 % — ABNORMAL LOW (ref 12–46)
Monocytes Absolute: 0.3 10*3/uL (ref 0.1–1.0)
Monocytes Relative: 2 % — ABNORMAL LOW (ref 3–12)
Neutro Abs: 12 10*3/uL — ABNORMAL HIGH (ref 1.7–7.7)

## 2011-06-02 LAB — HEMOGLOBIN A1C: Hgb A1c MFr Bld: 5.8 % — ABNORMAL HIGH (ref <5.7)

## 2011-06-02 MED ORDER — DEXTROSE 5 % IV SOLN
1.0000 g | INTRAVENOUS | Status: DC
  Administered 2011-06-02 – 2011-06-04 (×3): 1 g via INTRAVENOUS
  Filled 2011-06-02 (×4): qty 10

## 2011-06-02 MED ORDER — METOPROLOL TARTRATE 50 MG PO TABS
50.0000 mg | ORAL_TABLET | Freq: Two times a day (BID) | ORAL | Status: DC
  Administered 2011-06-02 – 2011-06-06 (×10): 50 mg via ORAL
  Filled 2011-06-02 (×11): qty 1

## 2011-06-02 MED ORDER — ENOXAPARIN SODIUM 80 MG/0.8ML ~~LOC~~ SOLN
80.0000 mg | SUBCUTANEOUS | Status: DC
  Administered 2011-06-02 – 2011-06-06 (×5): 80 mg via SUBCUTANEOUS
  Filled 2011-06-02 (×6): qty 0.8

## 2011-06-02 MED ORDER — ALBUTEROL SULFATE (5 MG/ML) 0.5% IN NEBU: 2.5000 mg | INHALATION_SOLUTION | RESPIRATORY_TRACT | Status: DC | PRN

## 2011-06-02 MED ORDER — HYDROCODONE-ACETAMINOPHEN 5-325 MG PO TABS
1.0000 | ORAL_TABLET | ORAL | Status: DC | PRN
  Administered 2011-06-02 – 2011-06-03 (×3): 2 via ORAL
  Administered 2011-06-04: 1 via ORAL
  Administered 2011-06-04 – 2011-06-06 (×6): 2 via ORAL
  Filled 2011-06-02 (×4): qty 2
  Filled 2011-06-02: qty 1
  Filled 2011-06-02 (×5): qty 2

## 2011-06-02 MED ORDER — ALPRAZOLAM 1 MG PO TABS
1.0000 mg | ORAL_TABLET | Freq: Three times a day (TID) | ORAL | Status: DC
  Administered 2011-06-02 – 2011-06-06 (×14): 1 mg via ORAL
  Filled 2011-06-02 (×14): qty 1

## 2011-06-02 MED ORDER — ACETAMINOPHEN 325 MG PO TABS
650.0000 mg | ORAL_TABLET | Freq: Four times a day (QID) | ORAL | Status: DC | PRN
  Administered 2011-06-05: 650 mg via ORAL
  Filled 2011-06-02: qty 2

## 2011-06-02 MED ORDER — BIOTENE DRY MOUTH MT LIQD
15.0000 mL | Freq: Two times a day (BID) | OROMUCOSAL | Status: DC
  Administered 2011-06-02 – 2011-06-06 (×9): 15 mL via OROMUCOSAL

## 2011-06-02 MED ORDER — CLONIDINE HCL 0.3 MG PO TABS
0.3000 mg | ORAL_TABLET | Freq: Three times a day (TID) | ORAL | Status: DC
  Administered 2011-06-02 – 2011-06-04 (×7): 0.3 mg via ORAL
  Filled 2011-06-02 (×10): qty 1

## 2011-06-02 MED ORDER — LOSARTAN POTASSIUM 50 MG PO TABS
100.0000 mg | ORAL_TABLET | Freq: Every day | ORAL | Status: DC
  Administered 2011-06-02 – 2011-06-06 (×5): 100 mg via ORAL
  Filled 2011-06-02 (×5): qty 2

## 2011-06-02 MED ORDER — FUROSEMIDE 40 MG PO TABS
40.0000 mg | ORAL_TABLET | Freq: Two times a day (BID) | ORAL | Status: DC
  Administered 2011-06-02 – 2011-06-06 (×10): 40 mg via ORAL
  Filled 2011-06-02 (×13): qty 1

## 2011-06-02 MED ORDER — DEXTROSE 5 % IV SOLN
500.0000 mg | Freq: Every day | INTRAVENOUS | Status: DC
  Administered 2011-06-02 – 2011-06-04 (×3): 500 mg via INTRAVENOUS
  Filled 2011-06-02 (×4): qty 500

## 2011-06-02 MED ORDER — HYDRALAZINE HCL 50 MG PO TABS
50.0000 mg | ORAL_TABLET | Freq: Three times a day (TID) | ORAL | Status: DC
  Administered 2011-06-02 – 2011-06-06 (×14): 50 mg via ORAL
  Filled 2011-06-02 (×17): qty 1

## 2011-06-02 MED ORDER — TRAMADOL HCL 50 MG PO TABS
50.0000 mg | ORAL_TABLET | Freq: Three times a day (TID) | ORAL | Status: DC | PRN
  Administered 2011-06-06: 50 mg via ORAL
  Filled 2011-06-02 (×2): qty 1

## 2011-06-02 MED ORDER — ACETAMINOPHEN 650 MG RE SUPP: 650.0000 mg | Freq: Four times a day (QID) | RECTAL | Status: DC | PRN

## 2011-06-02 MED ORDER — IPRATROPIUM BROMIDE 0.02 % IN SOLN: 0.5000 mg | RESPIRATORY_TRACT | Status: DC | PRN

## 2011-06-02 MED ORDER — SODIUM CHLORIDE 0.9 % IJ SOLN
3.0000 mL | Freq: Two times a day (BID) | INTRAMUSCULAR | Status: DC
  Administered 2011-06-02 – 2011-06-06 (×10): 3 mL via INTRAVENOUS

## 2011-06-02 NOTE — Progress Notes (Signed)
Consult for Education.  Chart reviewed pt Morbidly obese with BMI of 68.4.  Ht:  163 cm(5'4"), Weight:  182kg (400#).  HTN and Fluid retention noted.  Diet Heart Healthy with very good intake.  Pt currently sleeping.  Education on Weight loss as appropriate prior to d/c.  Oran Rein, RD [phone]

## 2011-06-02 NOTE — Progress Notes (Signed)
   CARE MANAGEMENT NOTE 06/02/2011  Patient:  Jenna Chandler, Jenna Chandler   Account Number:  [id number]  Date Initiated:  06/02/2011  Documentation initiated by:  Jiles Crocker  Subjective/Objective Assessment:   ADMITTED WITH COPD EXACERBATION     Action/Plan:   INDEPENDENT PRIOR TO ADMISSION; GOES TO [facility] FOR MED[facility]ARE- APT July 12, 2011 AT 2PM WITH DR Venetia Night; HAS HOME 02 THROUGH [facility]   Anticipated DC Date:  06/05/2011   Anticipated DC Plan:  HOME/SELF CARE          Status of service:  In process, will continue to follow Medicare Important Message given?  NA - LOS <3 / Initial given by admissions (If response is "NO", the following Medicare IM given date fields will be blank)  Per UR Regulation:  Reviewed for med. necessity/level of care/duration of stay  Comments:  06/02/2011- B Pamelyn Bancroft RN, BSN, MHA

## 2011-06-02 NOTE — Discharge Instructions (Signed)
Please keep your apt at [facility] [phone], if unable to keep apt, please call to reschedule.

## 2011-06-02 NOTE — Progress Notes (Signed)
ANTICOAGULATION CONSULT NOTE - Initial Consult  Pharmacy Consult for Lovenox Indication: VTE prophylaxis  No Known Allergies  Patient Measurements: Weight: 400 lb 4.8 oz (181.575 kg) Heparin Dosing Weight:   Vital Signs: Temp: 98.8 F (37.1 C) (04/26 0003) Temp src: Oral (04/26 0003) BP: 156/92 mmHg (04/26 0003) Pulse Rate: 96  (04/26 0003)  Labs:  Basename 06/01/11 1850  HGB 11.4*  HCT 40.2  PLT 218  APTT --  LABPROT --  INR --  HEPARINUNFRC --  CREATININE 0.69  CKTOTAL --  CKMB --  TROPONINI --   The CrCl is unknown because both a height and weight (above a minimum accepted value) are required for this calculation.  Medical History: Past Medical History  Diagnosis Date  . Asthma   . Hypertension   . COPD (chronic obstructive pulmonary disease)   . Obesity   . Manic, depressive   . Enlarged heart   . Fibromyalgia     Medications:  Scheduled:    . ALPRAZolam  1 mg Oral TID  . azithromycin  500 mg Intravenous QHS  . cefTRIAXone (ROCEPHIN)  IV  1 g Intravenous Once  . cefTRIAXone (ROCEPHIN)  IV  1 g Intravenous Q24H  . cloNIDine  0.3 mg Oral TID  . enoxaparin  80 mg Subcutaneous Q24H  . furosemide  40 mg Oral BID  . hydrALAZINE  50 mg Oral Q8H  . losartan  100 mg Oral Daily  . methylPREDNISolone (SOLU-MEDROL) injection  60 mg Intravenous Q12H  . metoprolol  50 mg Oral BID  . sodium chloride  3 mL Intravenous Q12H  . DISCONTD: azithromycin  500 mg Intravenous Once  . DISCONTD: heparin  4,000 Units Intravenous Once    Assessment: Patient ordered 30mg  sq q24hr of lovenox.  Patient weighs 181kg per chart.    Goal of Therapy:  Enoxaparin dosed based on patient weight and renal function   Plan:  Change lovenox to 80mg  sq q24hr Consider checking level to assure at desired goal  Darlina Guys, Jacquenette Shone Crowford 06/02/2011,12:15 AM

## 2011-06-02 NOTE — Progress Notes (Signed)
Patient goes to [facility] for follow up medical care- apt with Dr Jenna Chandler June 5,2013 at 2pm; [facility] called to see if they could move her apt up but they cannot; Patient has home 02 through [facility]; Independent prior to admission; B Tish Frederickson, BSN, [place].

## 2011-06-02 NOTE — Progress Notes (Addendum)
PROGRESS NOTE  Jenna Chandler ZOX:[id number] DOB: Apr 18, 1965 DOA: 06/01/2011 PCP: No primary provider on file.  Brief narrative: Jenna Chandler is a 46 year old, morbidly, obese, female, with a known history of asthma, and COPD, and a former smoker who was admitted to the hospital with a COPD exacerbation after failing outpatient therapy. Patient was seen by her pulmonologist on 06/01/2011 and referred to the emergency department where she was admitted for treatment of her symptoms.   Assessment/Plan: Principal Problem:  *COPD with acute exacerbation  Admitted and placed on every 2 hour bronchodilator therapy as needed.  Supplemental oxygen at 2 L to maintain oxygen saturation greater than 90% ordered.  Nocturnal CPAP.  Empiric Solu-Medrol 60 mg every 12 hours.  Empiric antibiotics with azithromycin and ceftriaxone. Active Problems:  Lethargy  Arterial blood gas ordered to rule out CO2 narcosis.  HTN (hypertension)  Reasonable control on home medications including Cozaar, hydralazine, metoprolol, and clonidine.  Morbid obesity  BMI 68.9  Dietitian evaluation.  Manic, depressive  Not on any maintenance medications.  Xanax as needed for anxiety ordered.  Normocytic Anemia  Anemia panel done 05/13/2011 which showed low iron, low ferritin, normal B12 and folate.  Hemoglobin stable.  Diastolic CHF, chronic  Two-dimensional echocardiogram done 05/12/2011 showed an ejection fraction of 65% and no left ventricular diastolic dysfunction.  Chest x-ray consistent with possible pulmonary edema.  Pro BNP mildly elevated.  Continue Lasix twice a day.  Bronchopneumonia  Chest x-ray inconclusive but could be consistent with multifocal pneumonia.  Mild elevation of white blood cells noted.  On empiric azithromycin and Rocephin.  OSA (obstructive sleep apnea)  Continue nocturnal CPAP.   Code Status: Full Family Communication: None at bedside. Disposition Plan: Home when  stable.  Medical Consultants:  None  Other consultants:  Dietician  Antibiotics:  Rocephin 06/01/11--->  Azithromycin 06/01/11--->   Subjective  Jenna Chandler is lethargic.  Opens her eyes briefly to stimulation but does not awaken long enough to answer any questions.   Objective    Interim History: Stable overnight.   Objective: Filed Vitals:   06/02/11 0003 06/02/11 0055 06/02/11 0448 06/02/11 1010  BP: 156/92  149/89 146/85  Pulse: 96 93 76 82  Temp: 98.8 F (37.1 C)  97.7 F (36.5 C)   TempSrc: Oral  Oral   Resp: 20 20 20    Height: 5\' 4"  (1.626 m)     Weight: 181.575 kg (400 lb 4.8 oz)  181.666 kg (400 lb 8 oz)   SpO2: 99% 97% 96%     Intake/Output Summary (Last 24 hours) at 06/02/11 1224 Last data filed at 06/02/11 0900  Gross per 24 hour  Intake    480 ml  Output   1200 ml  Net   -720 ml    Exam: Gen:  Lethargic Cardiovascular:  RRR, No M/R/G Respiratory: Lungs diminished Gastrointestinal: Abdomen soft, NT/ND with normal active bowel sounds. Extremities: 2+ pitting edema    Data Reviewed: Basic Metabolic Panel:  Lab 06/02/11 8119 06/01/11 1850  NA 134* 137  K 3.7 3.9  CL 94* 96  CO2 31 33*  GLUCOSE 182* 79  BUN 7 10  CREATININE 0.67 0.69  CALCIUM 8.8 9.0  MG 1.8 --  PHOS 3.4 --   GFR Estimated Creatinine Clearance: 144.8 ml/min (by C-G formula based on Cr of 0.67). Liver Function Tests:  Lab 06/02/11 0040  AST 19  ALT 18  ALKPHOS 62  BILITOT 0.4  PROT 6.5  ALBUMIN 3.0*   Coagulation  profile  Lab 06/02/11 0040  INR 1.06  PROTIME --    CBC:  Lab 06/02/11 0040 06/01/11 1850  WBC 12.9* 8.5  NEUTROABS 12.0* --  HGB 10.4* 11.4*  HCT 36.1 40.2  MCV 80.4 81.2  PLT 253 218   Cardiac Enzymes:  Lab 06/02/11 0800 06/02/11 0040  CKTOTAL 82 77  CKMB 3.5 2.5  CKMBINDEX -- --  TROPONINI <0.30 <0.30   BNP:  Ref. Range 06/02/2011 00:40  Pro B Natriuretic peptide (BNP) Latest Range: 0-125 pg/mL 223.7 (H)    CBG:  Lab  06/02/11 0806  GLUCAP 123*   Hgb A1c  Basename 06/02/11 0040  HGBA1C 5.8*   Thyroid function studies  Basename 06/02/11 0040  TSH 1.041  T4TOTAL --  T3FREE --  THYROIDAB --   Procedures and Diagnostic Studies:   Dg Chest Port 1 View 06/01/2011 IMPRESSION: 1.  Findings, as above, most suggestive of mild congestive heart failure.  Strictly speaking, superimposed multifocal infection such as severe bronchitis or early multilobar bronchopneumonia cannot be entirely excluded, however, this is not strongly favored.  Clinical correlation is recommended.  Original Report Authenticated By: Florencia Reasons, M.D.     Scheduled Meds:    . ALPRAZolam  1 mg Oral TID  . antiseptic oral rinse  15 mL Mouth Rinse BID  . azithromycin  500 mg Intravenous QHS  . cefTRIAXone (ROCEPHIN)  IV  1 g Intravenous Once  . cefTRIAXone (ROCEPHIN)  IV  1 g Intravenous Q24H  . cloNIDine  0.3 mg Oral TID  . enoxaparin  80 mg Subcutaneous Q24H  . furosemide  40 mg Oral BID  . hydrALAZINE  50 mg Oral Q8H  . losartan  100 mg Oral Daily  . methylPREDNISolone (SOLU-MEDROL) injection  60 mg Intravenous Q12H  . metoprolol  50 mg Oral BID  . sodium chloride  3 mL Intravenous Q12H  . DISCONTD: azithromycin  500 mg Intravenous Once  . DISCONTD: heparin  4,000 Units Intravenous Once   Continuous Infusions:    . DISCONTD: heparin        LOS: 1 day   Hillery Aldo, MD Pager [phone]  06/02/2011, 12:24 PM   Patient Teaching (information given to and reviewed with the patient): Jenna Chandler 06/02/2011  Treatment team:   Dr. Hillery Aldo, Hospitalist (Internist)  Medical Issues and plan: Principal Problem:  *COPD with acute exacerbation  Admitted and placed on every 2 hour bronchodilator therapy as needed.  Supplemental oxygen at 2 L to maintain oxygen saturation greater than 90% ordered.  CPAP at bedtime.  Steroids started to help reduce lung inflammation.  Antibiotics started  to treat any active lung infection. Active Problems:  High blood pressure  Reasonable control on home medications including Cozaar, hydralazine, metoprolol, and clonidine. Overweight  BMI 68.9  Dietitian evaluation ordered to help you with your diet.  Weight loss recommended. History of mood disorder  Not on any maintenance medications.  Xanax as needed for anxiety ordered. Low blood count (Anemia)  Anemia panel done 05/13/2011 which showed low iron, low ferritin, normal B12 and folate.  Hemoglobin stable.  Bronchopneumonia  Chest x-ray inconclusive but could be consistent with pneumonia.  On antibiotics.  OSA (obstructive sleep apnea)  Continue nocturnal CPAP.  Anticipated discharge date: 2-3 days depending on progress

## 2011-06-03 LAB — GLUCOSE, CAPILLARY: Glucose-Capillary: 165 mg/dL — ABNORMAL HIGH (ref 70–99)

## 2011-06-03 LAB — CBC
Hemoglobin: 10.7 g/dL — ABNORMAL LOW (ref 12.0–15.0)
MCH: 22.7 pg — ABNORMAL LOW (ref 26.0–34.0)
RBC: 4.72 MIL/uL (ref 3.87–5.11)
WBC: 10.1 10*3/uL (ref 4.0–10.5)

## 2011-06-03 LAB — BASIC METABOLIC PANEL
GFR calc non Af Amer: >90 mL/min (ref >90)
Glucose, Bld: 128 mg/dL — ABNORMAL HIGH (ref 70–99)
Potassium: 4.3 mEq/L (ref 3.5–5.1)
Sodium: 138 mEq/L (ref 135–145)

## 2011-06-03 MED ORDER — METHYLPREDNISOLONE SODIUM SUCC 40 MG IJ SOLR
40.0000 mg | Freq: Two times a day (BID) | INTRAMUSCULAR | Status: DC
  Administered 2011-06-03 – 2011-06-04 (×2): 40 mg via INTRAVENOUS
  Filled 2011-06-03 (×3): qty 1

## 2011-06-03 NOTE — Progress Notes (Signed)
PROGRESS NOTE  LYNEE ROSENBACH ZOX:[id number] DOB: 04-Aug-1965 DOA: 06/01/2011 PCP: No primary provider on file.  Brief narrative: Ms. Kosanke is a 46 year old, morbidly, obese, female, with a known history of asthma, and COPD, and a former smoker who was admitted to the hospital with a COPD exacerbation after failing outpatient therapy. Patient was seen by her pulmonologist on 06/01/2011 and referred to the emergency department where she was admitted for treatment of her symptoms.   Assessment/Plan: Principal Problem:  *COPD with acute exacerbation  Admitted and placed on every 2 hour bronchodilator therapy as needed.  Supplemental oxygen at 2 L to maintain oxygen saturation greater than 90% ordered.  ABG done on 06/02/2011 showed some hypercarbia, pH was 7.420.  Continue nocturnal CPAP.  Continue empiric Solu-Medrol every 12 hours, but reduce the dose to 40 mg.  Continue empiric antibiotics with azithromycin and ceftriaxone. Active Problems:  Lethargy  Arterial blood gas ordered to rule out CO2 narcosis done 06/02/2011 with findings as noted below.  Less lethargic today.  HTN (hypertension)  Reasonable control on home medications including Cozaar, hydralazine, metoprolol, and clonidine.  Morbid obesity  BMI 16.9  Dietitian evaluation performed on 06/02/2011.  Manic, depressive  Not on any maintenance medications.  Xanax as needed for anxiety ordered.  Normocytic Anemia  Anemia panel done 05/13/2011 which showed low iron, low ferritin, normal B12 and folate.  Hemoglobin stable.  Diastolic CHF, chronic  Two-dimensional echocardiogram done 05/12/2011 showed an ejection fraction of 65% and no left ventricular diastolic dysfunction.  Chest x-ray consistent with possible pulmonary edema.  Pro BNP mildly elevated.  Continue Lasix twice a day.  Bronchopneumonia  Chest x-ray inconclusive but could be consistent with multifocal pneumonia.  Mild elevation of white blood  cells noted.  On empiric azithromycin and Rocephin.  OSA (obstructive sleep apnea)  Continue nocturnal CPAP.   Code Status: Full Family Communication: None at bedside. Disposition Plan: Home when stable.  Medical Consultants:  None  Other consultants:  Dietician  Antibiotics:  Rocephin 06/01/11--->  Azithromycin 06/01/11--->   Subjective  Ms. Cavey is less lethargic.  She complains of dyspnea on exertion and ongoing cough with yellow sputum. No chest pain.   Objective    Interim History: Stable overnight.   Objective: Filed Vitals:   06/02/11 1010 06/02/11 1417 06/02/11 2115 06/03/11 0604  BP:    158/83  Pulse: 82 69 69   Temp:  97.8 F (36.6 C) 97.7 F (36.5 C) 97.7 F (36.5 C)  TempSrc:  Oral Oral Oral  Resp:  18 20 18   Height:      Weight:    181.62 kg (400 lb 6.4 oz)  SpO2:  99% 98% 90%    Intake/Output Summary (Last 24 hours) at 06/03/11 1101 Last data filed at 06/02/11 2100  Gross per 24 hour  Intake    240 ml  Output      0 ml  Net    240 ml    Exam: Gen:  Less lethargic Cardiovascular:  RRR, No M/R/G Respiratory: Lungs diminished with a few high pitched wheezes Gastrointestinal: Abdomen soft, NT/ND with normal active bowel sounds. Extremities: 2+ pitting edema    Data Reviewed: Basic Metabolic Panel:  Lab 06/03/11 8119 06/02/11 0040 06/01/11 1850  NA 138 134* 137  K 4.3 3.7 --  CL 98 94* 96  CO2 35* 31 33*  GLUCOSE 128* 182* 79  BUN 11 7 10   CREATININE 0.73 0.67 0.69  CALCIUM 9.0 8.8 9.0  MG --  1.8 --  PHOS -- 3.4 --   GFR Estimated Creatinine Clearance: 144.8 ml/min (by C-G formula based on Cr of 0.73). Liver Function Tests:  Lab 06/02/11 0040  AST 19  ALT 18  ALKPHOS 62  BILITOT 0.4  PROT 6.5  ALBUMIN 3.0*   Coagulation profile  Lab 06/02/11 0040  INR 1.06  PROTIME --    CBC:  Lab 06/03/11 0530 06/02/11 0040 06/01/11 1850  WBC 10.1 12.9* 8.5  NEUTROABS -- 12.0* --  HGB 10.7* 10.4* 11.4*  HCT 37.8  36.1 40.2  MCV 80.1 80.4 81.2  PLT 274 253 218   Cardiac Enzymes:  Lab 06/02/11 1605 06/02/11 0800 06/02/11 0040  CKTOTAL 71 82 77  CKMB 3.8 3.5 2.5  CKMBINDEX -- -- --  TROPONINI <0.30 <0.30 <0.30   BNP:  Ref. Range 06/02/2011 00:40  Pro B Natriuretic peptide (BNP) Latest Range: 0-125 pg/mL 223.7 (H)   ABG:  Ref. Range 06/02/2011 14:40  Sample type No range found ARTERIAL DRAW  Delivery systems No range found NASAL CANNULA  O2 Content No range found 2.0  pH, Arterial Latest Range: 7.350-7.400  7.420 (H)  pCO2 arterial Latest Range: 35.0-45.0 mmHg 49.8 (H)  pO2, Arterial Latest Range: 80.0-100.0 mmHg 100.0  Bicarbonate Latest Range: 20.0-24.0 mEq/L 31.7 (H)  TCO2 Latest Range: 0-100 mmol/L 28.8  Acid-Base Excess Latest Range: 0.0-2.0 mmol/L 6.6 (H)  O2 Saturation No range found 98.6  Patient temperature No range found 98.6  Collection site No range found RADIAL  Allens test (pass/fail) Latest Range: PASS  PASS   CBG:  Lab 06/03/11 0756 06/02/11 0806  GLUCAP 165* 123*   Hgb A1c  Basename 06/02/11 0040  HGBA1C 5.8*   Thyroid function studies  Basename 06/02/11 0040  TSH 1.041  T4TOTAL --  T3FREE --  THYROIDAB --   Procedures and Diagnostic Studies:   Dg Chest Port 1 View 06/01/2011 IMPRESSION: 1.  Findings, as above, most suggestive of mild congestive heart failure.  Strictly speaking, superimposed multifocal infection such as severe bronchitis or early multilobar bronchopneumonia cannot be entirely excluded, however, this is not strongly favored.  Clinical correlation is recommended.  Original Report Authenticated By: Florencia Reasons, M.D.     Scheduled Meds:    . ALPRAZolam  1 mg Oral TID  . antiseptic oral rinse  15 mL Mouth Rinse BID  . azithromycin  500 mg Intravenous QHS  . cefTRIAXone (ROCEPHIN)  IV  1 g Intravenous Q24H  . cloNIDine  0.3 mg Oral TID  . enoxaparin  80 mg Subcutaneous Q24H  . furosemide  40 mg Oral BID  . hydrALAZINE  50 mg Oral  Q8H  . losartan  100 mg Oral Daily  . methylPREDNISolone (SOLU-MEDROL) injection  60 mg Intravenous Q12H  . metoprolol  50 mg Oral BID  . sodium chloride  3 mL Intravenous Q12H   Continuous Infusions:      LOS: 2 days   Hillery Aldo, MD Pager [phone]  06/03/2011, 11:01 AM   Patient Teaching (information given to and reviewed with the patient): DEMIAH GULLICKSON 06/03/2011  Treatment team:   Dr. Hillery Aldo, Hospitalist (Internist)  Medical Issues and plan: Principal Problem:  *COPD with acute exacerbation  Admitted and placed on every 2 hour bronchodilator therapy as needed.  Supplemental oxygen at 2 L to maintain oxygen saturation greater than 90% ordered.  CPAP at bedtime.  Steroids started to help reduce lung inflammation, will start to reduce the dose as tolerated.  Antibiotics started to treat any active lung infection. Active Problems:  High blood pressure  Reasonable control on home medications including Cozaar, hydralazine, metoprolol, and clonidine. Overweight  BMI 16.9  Dietitian evaluation ordered to help you with your diet.  Weight loss recommended.  Physical therapy evaluation. History of mood disorder  Not on any maintenance medications.  Xanax as needed for anxiety ordered. Low blood count (Anemia)  Anemia panel done 05/13/2011 which showed low iron, low ferritin, normal B12 and folate.  Hemoglobin stable.  Bronchopneumonia  Chest x-ray inconclusive but could be consistent with pneumonia.  On antibiotics.  OSA (obstructive sleep apnea)  Continue nocturnal CPAP.  Anticipated discharge date: 1-2 days depending on progress

## 2011-06-03 NOTE — Progress Notes (Signed)
Pt placed on bari bed for comfort. Genia Harold

## 2011-06-03 NOTE — Evaluation (Signed)
Physical Therapy Evaluation Patient Details Name: Jenna Chandler MRN: [id number] DOB: 06/14/1965 Today's Date: 06/03/2011 Time: 8295-6213 PT Time Calculation (min): 21 min  PT Assessment / Plan / Recommendation Clinical Impression  Pt admitted for acute COPD exacerbation.  Pt would benefit from acute PT services in order to improve independence with transfers and ambulation as well as increase activity tolerance.  Pt became very SOB with ambulation on 3L O2 and required pursed lip breathing.  Discussed ST-SNF vs HHPT with pt and she reports she will discuss d/c plans with family.  Recommended RW for pt to assist with energy conservation as well as nutrition consult.    PT Assessment  Patient needs continued PT services    Follow Up Recommendations  Skilled nursing facility;Home health PT (SNF, if not then HHPT)    Equipment Recommendations  Rolling walker with 5" wheels (bariatric RW)    Frequency Min 3X/week    Precautions / Restrictions Precautions Precautions: Fall   Pertinent Vitals/Pain       Mobility  Bed Mobility Bed Mobility: Not assessed Details for Bed Mobility Assistance: pt sitting EOB on arrival and departure Transfers Transfers: Sit to Stand;Stand to Sit Sit to Stand: 4: Min guard;With upper extremity assist;From bed Stand to Sit: 4: Min guard;With upper extremity assist;To bed Details for Transfer Assistance: pt with wide BOS and increased forward trunk lean to stand and sit Ambulation/Gait Ambulation/Gait Assistance: 4: Min assist Ambulation Distance (Feet): 60 Feet (total) Assistive device: Other (Comment) (hallway rail) Ambulation/Gait Assistance Details: 30 feet then short standing rest break then back 30 feet to bed, ambulated on 3L O2 and SaO2 93% upon return to room, verbal cues for pursed lip breathing, pt pushed O2 ("I do this at home") and also need to hold onto hallway rail Gait Pattern: Step-through pattern;Decreased stride length;Wide base of  support Gait velocity: decreased    Exercises     PT Goals Acute Rehab PT Goals PT Goal Formulation: With patient Time For Goal Achievement: 06/10/11 Potential to Achieve Goals: Good Pt will go Sit to Stand: with supervision PT Goal: Sit to Stand - Progress: Goal set today Pt will go Stand to Sit: with supervision PT Goal: Stand to Sit - Progress: Goal set today Pt will Ambulate: >150 feet;with supervision;with least restrictive assistive device;Other (comment) (with SaO2 >92% and no cues for PLB) PT Goal: Ambulate - Progress: Goal set today Pt will Perform Home Exercise Program: with supervision, verbal cues required/provided PT Goal: Perform Home Exercise Program - Progress: Goal set today  Visit Information  Last PT Received On: 06/03/11 Assistance Needed: +1    Subjective Data  Subjective: "no, I don't know what I should be eating."  (would like nutritionist consult)   Prior Functioning  Home Living Lives With: Family (mother and brother) Available Help at Discharge: Family Type of Home: House Home Access: Level entry Home Layout: One level Home Adaptive Equipment: None Additional Comments: Pt reports she usually takes care of household chores for her mother and sometimes provides physical assist.  Pt on 2L O2 at home all the time. Prior Function Level of Independence: Independent Communication Communication: No difficulties    Cognition  Overall Cognitive Status: Appears within functional limits for tasks assessed/performed Arousal/Alertness: Awake/alert Orientation Level: Appears intact for tasks assessed Behavior During Session: [facility] for tasks performed    Extremity/Trunk Assessment Right Upper Extremity Assessment RUE ROM/Strength/Tone: [facility] for tasks assessed Left Upper Extremity Assessment LUE ROM/Strength/Tone: [facility] for tasks assessed Right Lower Extremity Assessment  RLE ROM/Strength/Tone: [facility] for tasks assessed Left Lower Extremity Assessment LLE  ROM/Strength/Tone: [facility] for tasks assessed   Balance    End of Session PT - End of Session Equipment Utilized During Treatment: Oxygen Activity Tolerance: Patient limited by fatigue Patient left: in bed;with call bell/phone within reach Nurse Communication: Other (comment) (nutrition consult, bariatric bed/chair?, reinforced toilet?)   Monque Haggar,KATHrine E 06/03/2011, 3:19 PM Pager: [phone]

## 2011-06-04 LAB — BASIC METABOLIC PANEL
CO2: 32 mEq/L (ref 19–32)
Calcium: 9 mg/dL (ref 8.4–10.5)
Chloride: 97 mEq/L (ref 96–112)
Creatinine, Ser: 0.75 mg/dL (ref 0.50–1.10)
Glucose, Bld: 102 mg/dL — ABNORMAL HIGH (ref 70–99)
Sodium: 137 mEq/L (ref 135–145)

## 2011-06-04 LAB — GLUCOSE, CAPILLARY: Glucose-Capillary: 125 mg/dL — ABNORMAL HIGH (ref 70–99)

## 2011-06-04 MED ORDER — CLONIDINE HCL 0.2 MG PO TABS
0.2000 mg | ORAL_TABLET | Freq: Two times a day (BID) | ORAL | Status: DC
  Administered 2011-06-04 – 2011-06-06 (×4): 0.2 mg via ORAL
  Filled 2011-06-04 (×5): qty 1

## 2011-06-04 MED ORDER — VITAMINS A & D EX OINT
TOPICAL_OINTMENT | CUTANEOUS | Status: AC
  Filled 2011-06-04: qty 5

## 2011-06-04 MED ORDER — GUAIFENESIN ER 600 MG PO TB12
600.0000 mg | ORAL_TABLET | Freq: Two times a day (BID) | ORAL | Status: DC
  Administered 2011-06-04 – 2011-06-06 (×5): 600 mg via ORAL
  Filled 2011-06-04 (×6): qty 1

## 2011-06-04 MED ORDER — METHYLPREDNISOLONE SODIUM SUCC 40 MG IJ SOLR
40.0000 mg | Freq: Every day | INTRAMUSCULAR | Status: DC
  Administered 2011-06-05: 40 mg via INTRAVENOUS
  Filled 2011-06-04: qty 1

## 2011-06-04 NOTE — Progress Notes (Signed)
Pt placed on CPAP with 4 LPM O2 bleed in.  Pt tolerating well at this time, RT to monitor and assess as needed

## 2011-06-04 NOTE — Progress Notes (Signed)
PROGRESS NOTE  Jenna Chandler ZOX:[id number] DOB: 03-Dec-1965 DOA: 06/01/2011 PCP: No primary provider on file.  Brief narrative: Jenna Chandler is a 46 year old, morbidly, obese, female, with a known history of asthma, and COPD, and a former smoker who was admitted to the hospital with a COPD exacerbation after failing outpatient therapy. Patient was seen by her pulmonologist on 06/01/2011 and referred to the emergency department where she was admitted for treatment of her symptoms.   Assessment/Plan: Principal Problem:  *COPD with acute exacerbation  Admitted and placed on every 2 hour bronchodilator therapy as needed.  Supplemental oxygen at 2 L to maintain oxygen saturation greater than 90% ordered.  ABG done on 06/02/2011 showed some hypercarbia, pH was 7.420.  Continue nocturnal CPAP.  Weaning Solu-Medrol.  Change to 40 mg daily today.  Continue empiric antibiotics with azithromycin and ceftriaxone.  Add Mucinex. Active Problems:  Lethargy  Arterial blood gas ordered to rule out CO2 narcosis done 06/02/2011 with findings as noted below.  Less lethargic today.  HTN (hypertension)  Blood pressure sub-optiamal on Cozaar, hydralazine, metoprolol, and clonidine.  Change clonidine to 0.2 mg BID (from 0.3 mg daily)  Morbid obesity  BMI 68.9  Dietitian evaluation performed on 06/02/2011 and education reinforced regarding portion control.  Diet teaching reinforced regarding portion choice and lower sugar/carbohydrate choices.  Manic, depressive  Not on any maintenance medications.  Xanax as needed for anxiety ordered.  Normocytic Anemia  Anemia panel done 05/13/2011 which showed low iron, low ferritin, normal B12 and folate.  Hemoglobin stable.  Diastolic CHF, chronic  Two-dimensional echocardiogram done 05/12/2011 showed an ejection fraction of 65% and no left ventricular diastolic dysfunction.  Chest x-ray consistent with possible pulmonary edema.  Pro BNP mildly  elevated.  Continue Lasix twice a day.  Bronchopneumonia  Chest x-ray inconclusive but could be consistent with multifocal pneumonia.  Mild elevation of white blood cells noted.  On empiric azithromycin and Rocephin.  OSA (obstructive sleep apnea)  Continue nocturnal CPAP.   Code Status: Full Family Communication: None at bedside. Disposition Plan: Home when stable.  Medical Consultants:  None  Other consultants:  Dietician: Portion control emphasized to assist with weight loss.  Physical therapy: SNF or home health PT  Occupational therapy: HH OT  Antibiotics:  Rocephin 06/01/11--->  Azithromycin 06/01/11--->   Subjective  Jenna Chandler is sitting up and is completely alert today.  She still gets very dyspneic with limited activity.  Still has a cough.  Motivated to lose weight.   Objective    Interim History: Stable overnight.   Objective: Filed Vitals:   06/03/11 1832 06/03/11 2046 06/03/11 2342 06/04/11 0617  BP: 118/73 179/108 153/89 157/100  Pulse: 77 94  77  Temp:  98.8 F (37.1 C)  98.2 F (36.8 C)  TempSrc:  Oral  Oral  Resp:  18  18  Height:      Weight:      SpO2:  98%  96%   No intake or output data in the 24 hours ending 06/04/11 1254  Exam: Gen:  Less lethargic Cardiovascular:  RRR, No M/R/G Respiratory: Lungs diminished but clear today. Gastrointestinal: Abdomen soft, NT/ND with normal active bowel sounds. Extremities: 2+ pitting edema    Data Reviewed: Basic Metabolic Panel:  Lab 06/04/11 8119 06/03/11 0530 06/02/11 0040 06/01/11 1850  NA 137 138 134* 137  K 4.2 4.3 -- --  CL 97 98 94* 96  CO2 32 35* 31 33*  GLUCOSE 102* 128* 182* 79  BUN 16 11 7 10   CREATININE 0.75 0.73 0.67 0.69  CALCIUM 9.0 9.0 8.8 9.0  MG -- -- 1.8 --  PHOS -- -- 3.4 --   GFR Estimated Creatinine Clearance: 144.8 ml/min (by C-G formula based on Cr of 0.75). Liver Function Tests:  Lab 06/02/11 0040  AST 19  ALT 18  ALKPHOS 62  BILITOT 0.4    PROT 6.5  ALBUMIN 3.0*   Coagulation profile  Lab 06/02/11 0040  INR 1.06  PROTIME --    CBC:  Lab 06/03/11 0530 06/02/11 0040 06/01/11 1850  WBC 10.1 12.9* 8.5  NEUTROABS -- 12.0* --  HGB 10.7* 10.4* 11.4*  HCT 37.8 36.1 40.2  MCV 80.1 80.4 81.2  PLT 274 253 218   Cardiac Enzymes:  Lab 06/02/11 1605 06/02/11 0800 06/02/11 0040  CKTOTAL 71 82 77  CKMB 3.8 3.5 2.5  CKMBINDEX -- -- --  TROPONINI <0.30 <0.30 <0.30   BNP:  Ref. Range 06/02/2011 00:40  Pro B Natriuretic peptide (BNP) Latest Range: 0-125 pg/mL 223.7 (H)   ABG:  Ref. Range 06/02/2011 14:40  Sample type No range found ARTERIAL DRAW  Delivery systems No range found NASAL CANNULA  O2 Content No range found 2.0  pH, Arterial Latest Range: 7.350-7.400  7.420 (H)  pCO2 arterial Latest Range: 35.0-45.0 mmHg 49.8 (H)  pO2, Arterial Latest Range: 80.0-100.0 mmHg 100.0  Bicarbonate Latest Range: 20.0-24.0 mEq/L 31.7 (H)  TCO2 Latest Range: 0-100 mmol/L 28.8  Acid-Base Excess Latest Range: 0.0-2.0 mmol/L 6.6 (H)  O2 Saturation No range found 98.6  Patient temperature No range found 98.6  Collection site No range found RADIAL  Allens test (pass/fail) Latest Range: PASS  PASS   CBG:  Lab 06/04/11 0810 06/03/11 0756 06/02/11 0806  GLUCAP 125* 165* 123*   Hgb A1c  Basename 06/02/11 0040  HGBA1C 5.8*   Thyroid function studies  Basename 06/02/11 0040  TSH 1.041  T4TOTAL --  T3FREE --  THYROIDAB --   Procedures and Diagnostic Studies:   Dg Chest Port 1 View 06/01/2011 IMPRESSION: 1.  Findings, as above, most suggestive of mild congestive heart failure.  Strictly speaking, superimposed multifocal infection such as severe bronchitis or early multilobar bronchopneumonia cannot be entirely excluded, however, this is not strongly favored.  Clinical correlation is recommended.  Original Report Authenticated By: Florencia Reasons, M.D.     Scheduled Meds:    . ALPRAZolam  1 mg Oral TID  . antiseptic oral  rinse  15 mL Mouth Rinse BID  . azithromycin  500 mg Intravenous QHS  . cefTRIAXone (ROCEPHIN)  IV  1 g Intravenous Q24H  . cloNIDine  0.3 mg Oral TID  . enoxaparin  80 mg Subcutaneous Q24H  . furosemide  40 mg Oral BID  . hydrALAZINE  50 mg Oral Q8H  . losartan  100 mg Oral Daily  . methylPREDNISolone (SOLU-MEDROL) injection  40 mg Intravenous Q12H  . metoprolol  50 mg Oral BID  . sodium chloride  3 mL Intravenous Q12H  . vitamin A & D      . vitamin A & D       Continuous Infusions:      LOS: 3 days   Hillery Aldo, MD Pager [phone]  06/04/2011, 12:54 PM   Patient Teaching (information given to and reviewed with the patient): Jenna Chandler 06/04/2011  Treatment team:   Dr. Hillery Aldo, Hospitalist (Internist)  Medical Issues and plan: Principal Problem:  *COPD with acute exacerbation  Admitted and placed on every 2 hour bronchodilator therapy as needed.  Supplemental oxygen at 2 L to maintain oxygen saturation greater than 90% ordered.  CPAP at bedtime.  Steroids started to help reduce lung inflammation, started to taper dose 06/03/11.  Continue antibiotics to treat any active lung infection.  Mucinex added 06/04/11 to help loosen secretions so you can cough them up easier. Active Problems:  High blood pressure  Not well controlled on home medications including Cozaar, hydralazine, metoprolol, and clonidine.  Change clonidine from 0.3 mg daily to 0.2 mg twice a day. Overweight  BMI 68.9  Dietitian saw you on 06/04/11 and taught you about portion control.  Weight loss recommended.  Reduce sugar/starch/carbohydrates to help lose weight.  Physical therapy evaluation: home health PT services recommended when you go home.  Your thyroid was checked and you had normal thyroid function. History of mood disorder  Not on any maintenance medications.  Xanax as needed for anxiety ordered. Low blood count (Anemia)  Anemia panel done 05/13/2011 which  showed low iron, low ferritin, normal B12 and folate.  Hemoglobin stable.  Bronchopneumonia  Chest x-ray inconclusive but could be consistent with pneumonia.  On antibiotics.  OSA (obstructive sleep apnea)  Continue nocturnal CPAP.  Anticipated discharge date: 1-2 days depending on progress

## 2011-06-04 NOTE — Evaluation (Signed)
Occupational Therapy Evaluation Patient Details Name: Jenna Chandler MRN: [id number] DOB: 13-Sep-1965 Today's Date: 06/04/2011 Time: 2725-3664 OT Time Calculation (min): 19 min  OT Assessment / Plan / Recommendation Clinical Impression  Pt admitted with COPD exacerbation.  Pt also with morbid obesity contributing to decreased endurance and ability to perform ADL including LB bathing and dressing.  Plan to follow pt acutely.  Recommend bariatric shower seat for home (noted that pt does not have insurance) and HHOT.    OT Assessment  Patient needs continued OT Services    Follow Up Recommendations  Home health OT;Supervision - Intermittent    Equipment Recommendations  Tub/shower seat;Other (comment) (bariatric)    Frequency Min 2X/week    Precautions / Restrictions Precautions Precautions: Fall Restrictions Weight Bearing Restrictions: No Other Position/Activity Restrictions: on 3L 02   Pertinent Vitals/Pain     ADL  Eating/Feeding: Simulated;Independent Where Assessed - Eating/Feeding: Edge of bed Grooming: Performed;Wash/dry hands;Supervision/safety Where Assessed - Grooming: Standing at sink Upper Body Bathing: Simulated;Minimal assistance Where Assessed - Upper Body Bathing: Sitting, bed Lower Body Bathing: Simulated;Moderate assistance Where Assessed - Lower Body Bathing: Sitting, bed;Sit to stand from bed Upper Body Dressing: Simulated;Set up Where Assessed - Upper Body Dressing: Sitting, bed Lower Body Dressing: Performed;Moderate assistance Where Assessed - Lower Body Dressing: Sitting, bed;Sit to stand from bed Toilet Transfer: Performed;Supervision/safety Toilet Transfer Method: Proofreader: Comfort height toilet;Grab bars Toileting - Clothing Manipulation: Performed;Supervision/safety Where Assessed - Glass blower/designer Manipulation: Standing Toileting - Hygiene: Performed;Supervision/safety Where Assessed - Toileting Hygiene: Sit to  stand from 3-in-1 or toilet Ambulation Related to ADLs: Supervision for ambulation to bathroom and back with 02. ADL Comments: Pt could benefit from shower chair, energy conservation ed, and AE instruction for LB ADL.    OT Goals Acute Rehab OT Goals OT Goal Formulation: With patient Time For Goal Achievement: 06/11/11 Potential to Achieve Goals: Good ADL Goals Pt Will Perform Lower Body Bathing: with modified independence;Sitting, edge of bed;Sit to stand from bed ADL Goal: Lower Body Bathing - Progress: Goal set today Pt Will Perform Lower Body Dressing: with modified independence;Sitting, bed;Sit to stand from bed ADL Goal: Lower Body Dressing - Progress: Goal set today Miscellaneous OT Goals Miscellaneous OT Goal #1: Pt will generalize energy conservation techniques in ADL with min cues. OT Goal: Miscellaneous Goal #1 - Progress: Goal set today  Visit Information  Last OT Received On: 06/04/11 Assistance Needed: +1    Subjective Data  Subjective: "I thought I was going to die before I came in here." Patient Stated Goal: Return to PLOF.   Prior Functioning  Home Living Lives With: Family;Other (Comment) (mom and brother) Available Help at Discharge: Family Type of Home: House Home Access: Level entry Home Layout: One level Bathroom Shower/Tub: Forensic scientist: Standard Home Adaptive Equipment: Other (comment) (oxygen) Prior Function Level of Independence: Needs assistance Needs Assistance: Bathing;Dressing Bath: Other (comment) (grandchildren help with LB bathing) Dressing: Other (comment) (grandchildren help with LB dressing) Communication Communication: No difficulties Dominant Hand: Right    Cognition  Overall Cognitive Status: Appears within functional limits for tasks assessed/performed Arousal/Alertness: Awake/alert Orientation Level: Appears intact for tasks assessed Behavior During Session: [facility] for tasks performed      Extremity/Trunk Assessment Right Upper Extremity Assessment RUE ROM/Strength/Tone: [facility] for tasks assessed Left Upper Extremity Assessment LUE ROM/Strength/Tone: WFL for tasks assessed   Mobility Bed Mobility Bed Mobility: Supine to Sit;Sit to Supine Supine to Sit: 6: Modified independent (Device/Increase time);With rails;HOB elevated (  30) Sit to Supine: 6: Modified independent (Device/Increase time);With rail;HOB elevated (30) Transfers Sit to Stand: 5: Supervision;From bed;From toilet Stand to Sit: 5: Supervision;With upper extremity assist;To toilet;To bed   Exercise    Balance    End of Session OT - End of Session Activity Tolerance: Patient limited by fatigue Patient left: in bed;with call bell/phone within reach   Evern Bio 06/04/2011, 12:10 PM [phone]

## 2011-06-04 NOTE — Plan of Care (Signed)
Problem: Food- and Nutrition-Related Knowledge Deficit (NB-1.1) Goal: Nutrition education Formal process to instruct or train a patient/client in a skill or to impart knowledge to help patients/clients voluntarily manage or modify food choices and eating behavior to maintain or improve health.  Outcome: Completed/Met Date Met:  06/04/11 Patient eager for education on weight loss. She reports trying various diet strategies in the past including weight loss pills, starvation diet, and cleanse diet, but has not been successful. Recently, she has been switching to whole foods, but continuing to eat large portions. She acknowledges the need to monitor portion size. We discussed weight loss strategies including continuing to eat whole foods and limit processed foods and using measuring cups to monitor portion size. Patient was educated on portion sizes for foods and given a sample meal plan. We also briefly discussed low sodium diet, including low sodium seasoning options. Patient provided with educational handouts.

## 2011-06-05 LAB — GLUCOSE, CAPILLARY: Glucose-Capillary: 82 mg/dL (ref 70–99)

## 2011-06-05 MED ORDER — AZITHROMYCIN 500 MG PO TABS
500.0000 mg | ORAL_TABLET | Freq: Every day | ORAL | Status: DC
  Filled 2011-06-05: qty 1

## 2011-06-05 MED ORDER — MOXIFLOXACIN HCL 400 MG PO TABS
400.0000 mg | ORAL_TABLET | Freq: Every day | ORAL | Status: DC
  Administered 2011-06-05 – 2011-06-06 (×2): 400 mg via ORAL
  Filled 2011-06-05 (×2): qty 1

## 2011-06-05 MED ORDER — PREDNISONE 50 MG PO TABS
60.0000 mg | ORAL_TABLET | Freq: Every day | ORAL | Status: DC
  Administered 2011-06-06: 60 mg via ORAL
  Filled 2011-06-05 (×2): qty 1

## 2011-06-05 NOTE — Progress Notes (Signed)
PROGRESS NOTE  Jenna Chandler ZOX:[id number] DOB: 1965-03-30 DOA: 06/01/2011 PCP: No primary provider on file.  Brief narrative: Jenna Chandler is a 46 year old, morbidly, obese, female, with a known history of asthma, and COPD, and a former smoker who was admitted to the hospital with a COPD exacerbation after failing outpatient therapy. Patient was seen by her pulmonologist on 06/01/2011 and referred to the emergency department where she was admitted for treatment of her symptoms.   Assessment/Plan: Principal Problem:  *COPD with acute exacerbation  Admitted and placed on every 2 hour bronchodilator therapy as needed.  Supplemental oxygen at 2 L to maintain oxygen saturation greater than 90% ordered.  ABG done on 06/02/2011 showed some hypercarbia, pH was 7.420.  Continue nocturnal CPAP.  D/C Solumedrol, start prednisone taper.  Continue empiric antibiotics, change to PO Avelox.  Continue Mucinex. Active Problems:  Lethargy  Arterial blood gas ordered to rule out CO2 narcosis done 06/02/2011 with findings as noted below.  Less lethargic over past 3 days.  HTN (hypertension)  Blood pressure sub-optiamal on Cozaar, hydralazine, metoprolol, and clonidine.  Changed clonidine to 0.2 mg BID (from 0.3 mg daily) 06/04/11 with better blood pressure control.  Morbid obesity  BMI 68.9  Dietitian evaluation performed on 06/02/2011 and education reinforced regarding portion control.  Diet teaching reinforced regarding portion choice and lower sugar/carbohydrate choices.  Manic, depressive  Not on any maintenance medications.  Xanax as needed for anxiety ordered.  Normocytic Anemia  Anemia panel done 05/13/2011 which showed low iron, low ferritin, normal B12 and folate.  Hemoglobin stable.  Diastolic CHF, chronic  Two-dimensional echocardiogram done 05/12/2011 showed an ejection fraction of 65% and no left ventricular diastolic dysfunction.  Chest x-ray consistent with possible  pulmonary edema.  Pro BNP mildly elevated.  Continue Lasix twice a day.  Bronchopneumonia  Chest x-ray inconclusive but could be consistent with multifocal pneumonia.  Mild elevation of white blood cells noted.  Put on empiric azithromycin and Rocephin on admission, changed to PO Avelox 06/05/11.  OSA (obstructive sleep apnea)  Continue nocturnal CPAP.   Code Status: Full Family Communication: None at bedside. Disposition Plan: Home when stable.  Medical Consultants:  None  Other consultants:  Dietician: Portion control emphasized to assist with weight loss.  Physical therapy: SNF or home health PT  Occupational therapy: HH OT  Antibiotics:  Rocephin 06/01/11--->06/05/11  Azithromycin 06/01/11--->06/05/11  Avelox 06/05/11--->   Subjective  Jenna Chandler complains of "pain all over" from fibromyalgia.  Still very dyspneic with activity.  Doesn't feel ready for d/c today due to activity intolerance.   Objective    Interim History: Stable overnight.   Objective: Filed Vitals:   06/05/11 0512 06/05/11 0800 06/05/11 1123 06/05/11 1456  BP: 144/86 163/93 158/95 143/81  Pulse: 92 93  76  Temp: 97.7 F (36.5 C)   98.1 F (36.7 C)  TempSrc: Oral   Oral  Resp: 20 20  20   Height:      Weight: 184.523 kg (406 lb 12.8 oz)     SpO2: 100%   97%    Intake/Output Summary (Last 24 hours) at 06/05/11 1509 Last data filed at 06/05/11 1457  Gross per 24 hour  Intake    860 ml  Output      0 ml  Net    860 ml    Exam: Gen:  Alert, NAD Cardiovascular:  RRR, No M/R/G Respiratory: Lungs diminished but clear today. Gastrointestinal: Abdomen soft, NT/ND with normal active bowel sounds. Extremities: 2+  pitting edema    Data Reviewed: Basic Metabolic Panel:  Lab 06/04/11 4098 06/03/11 0530 06/02/11 0040 06/01/11 1850  NA 137 138 134* 137  K 4.2 4.3 -- --  CL 97 98 94* 96  CO2 32 35* 31 33*  GLUCOSE 102* 128* 182* 79  BUN 16 11 7 10   CREATININE 0.75 0.73 0.67  0.69  CALCIUM 9.0 9.0 8.8 9.0  MG -- -- 1.8 --  PHOS -- -- 3.4 --   GFR Estimated Creatinine Clearance: 146.3 ml/min (by C-G formula based on Cr of 0.75). Liver Function Tests:  Lab 06/02/11 0040  AST 19  ALT 18  ALKPHOS 62  BILITOT 0.4  PROT 6.5  ALBUMIN 3.0*   Coagulation profile  Lab 06/02/11 0040  INR 1.06  PROTIME --    CBC:  Lab 06/03/11 0530 06/02/11 0040 06/01/11 1850  WBC 10.1 12.9* 8.5  NEUTROABS -- 12.0* --  HGB 10.7* 10.4* 11.4*  HCT 37.8 36.1 40.2  MCV 80.1 80.4 81.2  PLT 274 253 218   Cardiac Enzymes:  Lab 06/02/11 1605 06/02/11 0800 06/02/11 0040  CKTOTAL 71 82 77  CKMB 3.8 3.5 2.5  CKMBINDEX -- -- --  TROPONINI <0.30 <0.30 <0.30   BNP:  Ref. Range 06/02/2011 00:40  Pro B Natriuretic peptide (BNP) Latest Range: 0-125 pg/mL 223.7 (H)   ABG:  Ref. Range 06/02/2011 14:40  Sample type No range found ARTERIAL DRAW  Delivery systems No range found NASAL CANNULA  O2 Content No range found 2.0  pH, Arterial Latest Range: 7.350-7.400  7.420 (H)  pCO2 arterial Latest Range: 35.0-45.0 mmHg 49.8 (H)  pO2, Arterial Latest Range: 80.0-100.0 mmHg 100.0  Bicarbonate Latest Range: 20.0-24.0 mEq/L 31.7 (H)  TCO2 Latest Range: 0-100 mmol/L 28.8  Acid-Base Excess Latest Range: 0.0-2.0 mmol/L 6.6 (H)  O2 Saturation No range found 98.6  Patient temperature No range found 98.6  Collection site No range found RADIAL  Allens test (pass/fail) Latest Range: PASS  PASS   CBG:  Lab 06/05/11 0729 06/04/11 0810 06/03/11 0756 06/02/11 0806  GLUCAP 82 125* 165* 123*   Procedures and Diagnostic Studies:   Dg Chest Port 1 View 06/01/2011 IMPRESSION: 1.  Findings, as above, most suggestive of mild congestive heart failure.  Strictly speaking, superimposed multifocal infection such as severe bronchitis or early multilobar bronchopneumonia cannot be entirely excluded, however, this is not strongly favored.  Clinical correlation is recommended.  Original Report  Authenticated By: Florencia Reasons, M.D.     Scheduled Meds:    . ALPRAZolam  1 mg Oral TID  . antiseptic oral rinse  15 mL Mouth Rinse BID  . azithromycin  500 mg Oral QHS  . cefTRIAXone (ROCEPHIN)  IV  1 g Intravenous Q24H  . cloNIDine  0.2 mg Oral BID  . enoxaparin  80 mg Subcutaneous Q24H  . furosemide  40 mg Oral BID  . guaiFENesin  600 mg Oral BID  . hydrALAZINE  50 mg Oral Q8H  . losartan  100 mg Oral Daily  . methylPREDNISolone (SOLU-MEDROL) injection  40 mg Intravenous Daily  . metoprolol  50 mg Oral BID  . sodium chloride  3 mL Intravenous Q12H  . vitamin A & D      . DISCONTD: azithromycin  500 mg Intravenous QHS   Continuous Infusions:      LOS: 4 days   Jenna Aldo, MD Pager [phone]  06/05/2011, 3:09 PM   Patient Teaching (information given to and reviewed with  the patient): BRANDIE LOPES 06/05/2011  Treatment team:   Dr. Hillery Chandler, Hospitalist (Internist)  Medical Issues and plan: Principal Problem:  *COPD with acute exacerbation  Admitted and placed on every 2 hour bronchodilator therapy as needed.  Supplemental oxygen at 2 L to maintain oxygen saturation greater than 90% ordered.  CPAP at bedtime.  Steroids started to help reduce lung inflammation, will stop IV steroids today and put on prednisone taper.  Continue antibiotics to treat any active lung infection (change to oral antibiotics on 06/05/11).  Mucinex added 06/04/11 to help loosen secretions so you can cough them up easier. Active Problems:  High blood pressure  Not well controlled on home medications including Cozaar, hydralazine, metoprolol, and clonidine.  Change clonidine from 0.3 mg daily to 0.2 mg twice a day with better blood pressure control. Overweight  BMI 68.9  Dietitian saw you on 06/04/11 and taught you about portion control.  Weight loss recommended.  Reduce sugar/starch/carbohydrates to help lose weight.  Physical therapy evaluation: home  health PT services recommended when you go home.  Occupational therapy evaluation: Home health OT services and a tub/shower seat to help with bathing needs.  Your thyroid was checked and you had normal thyroid function. History of mood disorder  Not on any maintenance medications.  Xanax as needed for anxiety ordered. Low blood count (Anemia)  Anemia panel done 05/13/2011 which iron deficiency.  Green leafy vegetables and lean cuts of red meat are good sources of iron.  Hemoglobin stable.  Bronchopneumonia  Chest x-ray inconclusive but could be consistent with pneumonia.  On antibiotics.  OSA (obstructive sleep apnea)  Continue nocturnal CPAP.  Anticipated discharge date: 06/06/11; If you feel like you cannot manage at home by tomorrow, we will need to consider placement for rehab.

## 2011-06-05 NOTE — Progress Notes (Signed)
Pt stated that she did not need assistance with CPAP. RT instructed pt to call if she needed any thing.

## 2011-06-05 NOTE — Progress Notes (Signed)
Occupational Therapy Treatment Patient Details Name: Jenna Chandler MRN: [id number] DOB: 10/22/1965 Today's Date: 06/05/2011 Time: 4098-1191 OT Time Calculation (min): 32 min  OT Assessment / Plan / Recommendation Comments on Treatment Session      Follow Up Recommendations  Home health OT;Supervision - Intermittent    Equipment Recommendations  Tub/shower seat;Other (comment) (bariatric)    Frequency Min 2X/week             ADL  Grooming: Performed Where Assessed - Grooming: Standing at sink Toilet Transfer: Automotive engineer Method: Surveyor, minerals: Extra wide drop arm bedside commode Toileting - Clothing Manipulation: Simulated;Minimal assistance Where Assessed - Glass blower/designer Manipulation: Standing Toileting - Hygiene: Minimal assistance Where Assessed - Toileting Hygiene: Sit to stand from 3-in-1 or toilet;Standing ADL Comments: Pt fatigued quickly. Discusssed energy conservation techniques with patient inregards to ADL activity, such as sitting and taking rest breaks as needed, but trying to be as I as possible.    OT Goals Miscellaneous OT Goals OT Goal: Miscellaneous Goal #1 - Progress: Progressing toward goals     Subjective Data  Subjective: I know i need to lose some weight      Cognition  Overall Cognitive Status: Appears within functional limits for tasks assessed/performed Arousal/Alertness: Awake/alert Orientation Level: Appears intact for tasks assessed Behavior During Session: [facility] for tasks performed    Mobility Transfers Transfers: Sit to Stand Sit to Stand: 5: Supervision Stand to Sit: 5: Supervision         End of Session OT - End of Session Activity Tolerance: Patient limited by fatigue Patient left: in bed;with call bell/phone within reach;Other (comment) (sitting EOB)   Harleigh Civello, [facility]ung 06/05/2011, 3:30 PM

## 2011-06-05 NOTE — Progress Notes (Signed)
PHARMACIST - PHYSICIAN COMMUNICATION [facility] CONCERNING: Antibiotic IV to Oral Route Change Policy  RECOMMENDATION: This patient is receiving Zithromax by the intravenous route.  Based on criteria approved by the Pharmacy and Therapeutics Committee, the antibiotic(s) is/are being converted to the equivalent oral dose form(s).   DESCRIPTION: These criteria include:  Patient being treated for a respiratory tract infection, urinary tract infection, or cellulitis  The patient is not neutropenic and does not exhibit a GI malabsorption state  The patient is eating (either orally or via tube) and/or has been taking other orally administered medications for a least 24 hours  The patient is improving clinically and has a Tmax < 100.5  If you have questions about this conversion, please contact the Pharmacy Department  []   [phone] )  [facility] []   [phone] )  [facility]  []   [phone] )  [facility] [x]   [phone] )  [facility]    Hessie Knows, PharmD, [place] Pager [phone] 06/05/2011 1:17 PM

## 2011-06-06 ENCOUNTER — Telehealth: Payer: Self-pay | Admitting: Internal Medicine

## 2011-06-06 DIAGNOSIS — J449 Chronic obstructive pulmonary disease, unspecified: Secondary | ICD-10-CM

## 2011-06-06 LAB — GLUCOSE, CAPILLARY

## 2011-06-06 MED ORDER — MOXIFLOXACIN HCL 400 MG PO TABS: 400.0000 mg | ORAL_TABLET | Freq: Every day | ORAL | Status: AC

## 2011-06-06 MED ORDER — HYDROCODONE-ACETAMINOPHEN 5-325 MG PO TABS: 1.0000 | ORAL_TABLET | ORAL | Status: DC | PRN

## 2011-06-06 MED ORDER — GUAIFENESIN ER 600 MG PO TB12: 600.0000 mg | ORAL_TABLET | Freq: Two times a day (BID) | ORAL | Status: DC | PRN

## 2011-06-06 MED ORDER — CLONIDINE HCL 0.2 MG PO TABS: 0.2000 mg | ORAL_TABLET | Freq: Two times a day (BID) | ORAL | Status: DC

## 2011-06-06 MED ORDER — PREDNISONE (PAK) 10 MG PO TABS: ORAL_TABLET | ORAL | Status: AC

## 2011-06-06 NOTE — Progress Notes (Signed)
Occupational Therapy Treatment Patient Details Name: JANEE URESTE MRN: [id number] DOB: 07/01/1965 Today's Date: 06/06/2011 Time: 1300-1330 OT Time Calculation (min): 30 min  OT Assessment / Plan / Recommendation    Follow Up Recommendations  Home health OT;Supervision - Intermittent    Equipment Recommendations  Rolling walker with 5" wheels (pt requested portable O2 along with RW for home use)    Frequency Min 2X/week   Plan Discharge plan remains appropriate           ADL  Grooming: Performed;Supervision/safety Where Assessed - Grooming: Standing at sink Lower Body Dressing: Performed;Minimal assistance;Other (comment) (with reacher and sock aid) Where Assessed - Lower Body Dressing: Sit to stand from chair Toilet Transfer: Supervision/safety;Performed Toilet Transfer Method: Proofreader: Comfort height toilet;Grab bars Toileting - Clothing Manipulation: Performed;Supervision/safety Where Assessed - Glass blower/designer Manipulation: Standing ADL Comments: OT provided pt with reacher, sock aid , shoe horn and sponge.  Pt very thankful and appreciative of AE.  Education provided on all pieces of AE    OT Goals ADL Goals ADL Goal: Lower Body Dressing - Progress: Progressing toward goals Pt Will Transfer to Toilet: with modified independence;Comfort height toilet ADL Goal: Toilet Transfer - Progress: Goal set today Pt Will Perform Toileting - Clothing Manipulation: with modified independence;Standing ADL Goal: Toileting - Clothing Manipulation - Progress: Goal set today Pt Will Perform Toileting - Hygiene: with modified independence;Standing at 3-in-1/toilet ADL Goal: Toileting - Hygiene - Progress: Goal set today Miscellaneous OT Goals OT Goal: Miscellaneous Goal #1 - Progress: Progressing toward goals  Visit Information  Assistance Needed: +2 (for chair)          Cognition  Overall Cognitive Status: Appears within functional limits for  tasks assessed/performed Arousal/Alertness: Awake/alert Orientation Level: Appears intact for tasks assessed Behavior During Session: [facility] for tasks performed    Mobility Transfers Transfers: Sit to Stand;Stand to Sit Sit to Stand: 5: Supervision;With upper extremity assist;With armrests;From chair/3-in-1 Stand to Sit: 5: Supervision;With upper extremity assist;With armrests;To chair/3-in-1 Details for Transfer Assistance: pt fatigued during ambulation and required min assist to stand from chair         End of Session OT - End of Session Equipment Utilized During Treatment:  (AE) Activity Tolerance: Patient tolerated treatment well   Marvin Grabill, [facility]ung 06/06/2011, 1:44 PM

## 2011-06-06 NOTE — Discharge Summary (Signed)
Physician Discharge Summary  Patient ID: Jenna Chandler MRN: [id number] DOB/AGE: 15-Dec-1965 46 y.o.  Admit date: 06/01/2011 Discharge date: 06/06/2011  Primary Care Physician:  [facility]: Dr. Maple Hudson   Discharge Diagnoses:    Present on Admission:  .Morbid obesity .COPD with acute exacerbation .HTN (hypertension) .Manic, depressive .Normocytic anemia .Diastolic CHF, chronic .Bronchopneumonia .Elevated troponin .OSA (obstructive sleep apnea) .Lethargy  Discharge Medications:  Medication List  As of 06/06/2011  2:28 PM   TAKE these medications         albuterol 108 (90 BASE) MCG/ACT inhaler   Commonly known as: PROVENTIL HFA;VENTOLIN HFA   Inhale 2 puffs into the lungs every 6 (six) hours as needed. wheezing      ALPRAZolam 1 MG tablet   Commonly known as: XANAX   Take 1 tablet (1 mg total) by mouth 3 (three) times daily.      cloNIDine 0.2 MG tablet   Commonly known as: CATAPRES   Take 1 tablet (0.2 mg total) by mouth 2 (two) times daily.      furosemide 40 MG tablet   Commonly known as: LASIX   Take 1 tablet (40 mg total) by mouth 2 (two) times daily.      guaiFENesin 600 MG 12 hr tablet   Commonly known as: MUCINEX   Take 1 tablet (600 mg total) by mouth 2 (two) times daily as needed for congestion.      hydrALAZINE 50 MG tablet   Commonly known as: APRESOLINE   Take 1 tablet (50 mg total) by mouth every 8 (eight) hours.      HYDROcodone-acetaminophen 5-325 MG per tablet   Commonly known as: NORCO   Take 1-2 tablets by mouth every 4 (four) hours as needed.      losartan 100 MG tablet   Commonly known as: COZAAR   Take 1 tablet (100 mg total) by mouth daily.      metoprolol 50 MG tablet   Commonly known as: LOPRESSOR   Take 50 mg by mouth 2 (two) times daily.      moxifloxacin 400 MG tablet   Commonly known as: AVELOX   Take 1 tablet (400 mg total) by mouth daily at 6 PM.      predniSONE 10 MG tablet   Commonly known as: STERAPRED  UNI-PAK   Take 6 tabs 5/1, then decrease by 10 mg daily until off.      traMADol 50 MG tablet   Commonly known as: ULTRAM   Take 50 mg by mouth every 8 (eight) hours as needed. For pain.             Disposition and Follow-up: The patient is being discharged home.  She has follow up scheduled at [facility] on 06/08/11 at 11:15 a.m.   Medical Consults:  None  Other Consults:  Dietician: Portion control emphasized to assist with weight loss.   Physical therapy: SNF or home health PT   Occupational therapy: HH OT   Procedures and Diagnostic Studies:   X-ray Chest Pa And Lateral  05/12/2011  *RADIOLOGY REPORT*  Clinical Data: Productive cough  CHEST - 2 VIEW  Comparison: 05/11/2011 and 02/26/2011  Findings: Heart size appears mildly enlarged and this is unchanged in comparison to the prior exam.  There has been interval resolution of the pulmonary vascular congestion seen on the prior exam.  No focal infiltrates or signs of congestive failure are seen.  No pleural effusion is noted and a stable degree of mild central  peribronchial cuffing is noted.  Bony structures appear intact.  IMPRESSION: Interval resolution of pulmonary vascular congestion.  Stable cardiac enlargement  Original Report Authenticated By: Bertha Stakes, M.D.    Discharge Laboratory Values: Basic Metabolic Panel:  Lab 06/04/11 1610 06/03/11 0530 06/02/11 0040 06/01/11 1850  NA 137 138 134* 137  K 4.2 4.3 -- --  CL 97 98 94* 96  CO2 32 35* 31 33*  GLUCOSE 102* 128* 182* 79  BUN 16 11 7 10   CREATININE 0.75 0.73 0.67 0.69  CALCIUM 9.0 9.0 8.8 9.0  MG -- -- 1.8 --  PHOS -- -- 3.4 --   GFR Estimated Creatinine Clearance: 146.2 ml/min (by C-G formula based on Cr of 0.75). Liver Function Tests:  Lab 06/02/11 0040  AST 19  ALT 18  ALKPHOS 62  BILITOT 0.4  PROT 6.5  ALBUMIN 3.0*   Coagulation profile  Lab 06/02/11 0040  INR 1.06  PROTIME --    CBC:  Lab 06/03/11 0530 06/02/11 0040 06/01/11  1850  WBC 10.1 12.9* 8.5  NEUTROABS -- 12.0* --  HGB 10.7* 10.4* 11.4*  HCT 37.8 36.1 40.2  MCV 80.1 80.4 81.2  PLT 274 253 218   Cardiac Enzymes:  Lab 06/02/11 1605 06/02/11 0800 06/02/11 0040  CKTOTAL 71 82 77  CKMB 3.8 3.5 2.5  CKMBINDEX -- -- --  TROPONINI <0.30 <0.30 <0.30   CBG:  Lab 06/06/11 0724 06/05/11 0729 06/04/11 0810 06/03/11 0756 06/02/11 0806  GLUCAP 85 82 125* 165* 123*     Brief H and P: For complete details please refer to admission H and P, but in brief, Jenna Chandler is a 46 year old, morbidly, obese, female, with a known history of asthma, and COPD, and a former smoker who was admitted to the hospital with a COPD exacerbation after failing outpatient therapy. Patient was seen by her pulmonologist on 06/01/2011 and referred to the emergency department where she was admitted for treatment of her symptoms.    Physical Exam at Discharge: BP 138/88  Pulse 70  Temp(Src) 98.6 F (37 C) (Oral)  Resp 20  Ht 5\' 4"  (1.626 m)  Wt 184.342 kg (406 lb 6.4 oz)  BMI 69.76 kg/m2  SpO2 99%  LMP 05/17/2011 Gen: Alert, NAD  Cardiovascular: RRR, No M/R/G  Respiratory: Lungs diminished but clear today.  Gastrointestinal: Abdomen soft, NT/ND with normal active bowel sounds.  Extremities: 2+ pitting edema  Hospital Course:  Principal Problem:  *COPD with acute exacerbation  Admitted and placed on every 2 hour bronchodilator therapy as needed.  Supplemental oxygen at 2 L to maintain oxygen saturation greater than 90% ordered.  ABG done on 06/02/2011 showed some hypercarbia, pH was 7.420.  Continue nocturnal CPAP.  Started empiric Solumedrol, subsequently tapered and started prednisone taper 06/05/11.  Continue empiric antibiotics, change to PO Avelox. D/C on 5 more days of therapy Continue Mucinex as needed. Active Problems:  Lethargy  Arterial blood gas ordered to rule out CO2 narcosis done 06/02/2011 with findings as noted below.  Lethargy resolved after 2 days. HTN  (hypertension)  Blood pressure sub-optiamal on Cozaar, hydralazine, metoprolol, and clonidine.  Changed clonidine to 0.2 mg BID (from 0.3 mg daily) 06/04/11 with better blood pressure control. Morbid obesity  BMI 68.9  Dietitian evaluation performed on 06/02/2011 and education reinforced regarding portion control.  Diet teaching reinforced regarding portion choice and lower sugar/carbohydrate choices. Manic, depressive  Not on any maintenance medications.  Xanax as needed for anxiety ordered. Normocytic Anemia  Anemia panel done 05/13/2011 which showed low iron, low ferritin, normal B12 and folate.  Hemoglobin stable. Diastolic CHF, chronic  Two-dimensional echocardiogram done 05/12/2011 showed an ejection fraction of 65% and no left ventricular diastolic dysfunction.  Chest x-ray consistent with possible pulmonary edema.  Pro BNP mildly elevated.  Continue Lasix twice a day. Bronchopneumonia  Chest x-ray inconclusive but could be consistent with multifocal pneumonia.  Mild elevation of white blood cells noted.  Put on empiric azithromycin and Rocephin on admission, changed to PO Avelox 06/05/11. OSA (obstructive sleep apnea)  Continue nocturnal CPAP.   Diet:  Low sodium, heart healthy, calorie/carb restricted for weight loss.  Activity:  Increase activity slowly.  Condition at Discharge:   Improved.  Time spent on Discharge:  35 minutes.  Signed: Dr. Trula Ore Qianna Clagett Pager [phone] 06/06/2011, 2:28 PM  Gayla Medicus 06/06/2011  Teaching:  Treatment team:   Dr. Hillery Aldo, Hospitalist (Internist)  Medical Issues and plan: Principal Problem:  *COPD with acute exacerbation  Admitted and placed on every 2 hour bronchodilator therapy as needed.  Supplemental oxygen at 2 L to maintain oxygen saturation greater than 90% ordered.  CPAP at bedtime.  Now on prednisone taper.  Continue antibiotics to treat any active lung infection (change to oral antibiotics on  06/05/11), need 5 more days of treatment with antibiotics.  Mucinex added 06/04/11 to help loosen secretions so you can cough them up easier. Active Problems:  High blood pressure  Not well controlled on home medications including Cozaar, hydralazine, metoprolol, and clonidine.  Change clonidine from 0.3 mg daily to 0.2 mg twice a day with better blood pressure control. Overweight  BMI 68.9  Dietitian saw you on 06/04/11 and taught you about portion control.  Weight loss recommended.  Reduce sugar/starch/carbohydrates to help lose weight.  Physical therapy evaluation: home health PT services recommended when you go home.  Occupational therapy evaluation: Home health OT services and a tub/shower seat to help with bathing needs.  Your thyroid was checked and you had normal thyroid function. History of mood disorder  Not on any maintenance medications.  Xanax as needed for anxiety ordered. Low blood count (Anemia)  Anemia panel done 05/13/2011 which iron deficiency.  Green leafy vegetables and lean cuts of red meat are good sources of iron.  Hemoglobin stable.  Bronchopneumonia  Chest x-ray inconclusive but could be consistent with pneumonia.  On antibiotics.  OSA (obstructive sleep apnea)  Continue nocturnal CPAP.  Anticipated discharge date: Today.  We will set up home health services and equipment (rolling walker, tub/shower seat, portable oxygen tank).  Follow up with [facility] 06/08/11 at 11:15 a.m.

## 2011-06-06 NOTE — Progress Notes (Signed)
Received call from Dorothy with [facility], patient's apt with [facility] has been rescheduled for Jun 08, 2011 at 11:15; B Lobbyist, BSN, [place]

## 2011-06-06 NOTE — Progress Notes (Signed)
Talked to the patient about DCP; Patient has an apt with [facility] 06/08/2011 and Dorothy with [facility] is working with her to get other resources in the community; Patient is agreeable to [facility] for HHRN/ PT; DME - RW, shower seat, also [facility] called for a smaller 02 concentrator; Patient qualifies for the indigent funds - (3 day supply of medication that can only be used once a year). Patient stated that her sister has helped her in the past with her medication and prescriptions are filled at [facility]; Encouraged patient to keep her apt with [facility]; B Ave Filter RN, BSN, [place]

## 2011-06-06 NOTE — Progress Notes (Signed)
Physical Therapy Treatment Patient Details Name: Jenna Chandler MRN: [id number] DOB: [birth year] Today's Date: 06/06/2011 Time: 4696-2952 PT Time Calculation (min): 42 min 3 gt  PT Assessment / Plan / Recommendation Comments on Treatment Session  Pt able to ambulate 250 ft total with 3 resting/sitting breaks taken about every 75 ft.  Pt min guard assist using RW with 2nd person following with chair.  Pt on 3L O2 during ambulation, O2 sats @ 94%/HR 114. Pt states she would like a RW ordered for home use and a portable O2 tank if possible.  Pt stated her lower back was hurting her, but states its a constant issue.  Educated pt on importance of exercise and being as mobile as possible and reviewed some exercises she could do while sitting or lying in bed.     Follow Up Recommendations       Equipment Recommendations  Rolling walker with 5" wheels (pt requested portable O2 along with RW for home use)          Precautions / Restrictions         Mobility  Transfers Transfers: Sit to Stand;Stand to Sit Sit to Stand: From chair/3-in-1;From bed;4: Min guard;4: Min assist (during ambulation, pt fatigued and required min assist ) Stand to Sit: 4: Min guard Details for Transfer Assistance: pt fatigued during ambulation and required min assist to stand from chair Ambulation/Gait Ambulation/Gait Assistance: 4: Min guard Ambulation Distance (Feet): 250 Feet (total) Assistive device: Rolling walker Ambulation/Gait Assistance Details: 96' then sitting break X3; ambulated on 3L O2 sats 94%, HR 114. Pt used RW for ambulation and required VC repeatedly for gait pattern and position from RW.   Gait Pattern: Step-to pattern;Trunk flexed;Wide base of support Gait velocity: decreased General Gait Details: Pt required VC for gait pattern (likes to add 3rd step before advancing RW) and VC required to correct distance from RW Stairs: No Wheelchair Mobility Wheelchair Mobility: No        PT  Goals Acute Rehab PT Goals PT Goal Formulation: With patient Pt will go Sit to Stand: with supervision PT Goal: Sit to Stand - Progress: Progressing toward goal (when fatigued with ambulation, pt required min assist) Pt will go Stand to Sit: with supervision PT Goal: Stand to Sit - Progress: Progressing toward goal Pt will Ambulate: >150 feet;with supervision;with least restrictive assistive device PT Goal: Ambulate - Progress: Progressing toward goal  Visit Information  Last PT Received On: 06/06/11 Assistance Needed: +2 (for chair)    Subjective Data  Subjective: Pt sitting EOB, "I need to use the bathroom"- has been walking to bathroom without assistance         End of Session PT - End of Session Equipment Utilized During Treatment: Oxygen (3L) Activity Tolerance: Patient limited by fatigue Patient left: in chair;with call bell/phone within reach    [place], SPTA 06/06/2011, 1:36 PM  Felecia Shelling  PTA [facility]  Acute  Rehab Pager     [phone]

## 2011-06-06 NOTE — Telephone Encounter (Signed)
Per lecretia wants an order sent to [facility] to evaluate pt for portable eclipse system. Please advise Dr. Letitia Caul

## 2011-06-07 NOTE — Telephone Encounter (Signed)
Ok- DME- order [employer], [employer]. Eclipse. For dx COPD  2 L/M

## 2011-06-08 NOTE — Telephone Encounter (Signed)
Order has been placed and Jenna Chandler is aware. Nothing further was needed

## 2011-06-09 ENCOUNTER — Encounter (HOSPITAL_COMMUNITY): Payer: Self-pay | Admitting: Emergency Medicine

## 2011-06-09 ENCOUNTER — Emergency Department (HOSPITAL_COMMUNITY): Payer: Self-pay

## 2011-06-09 ENCOUNTER — Observation Stay (HOSPITAL_COMMUNITY)
Admission: EM | Admit: 2011-06-09 | Discharge: 2011-06-10 | Disposition: A | Discharge status: Home / Self Care | Payer: Self-pay | Attending: Internal Medicine | Admitting: Internal Medicine

## 2011-06-09 DIAGNOSIS — I5032 Chronic diastolic (congestive) heart failure: Secondary | ICD-10-CM | POA: Diagnosis present

## 2011-06-09 DIAGNOSIS — R05 Cough: Secondary | ICD-10-CM | POA: Insufficient documentation

## 2011-06-09 DIAGNOSIS — R0602 Shortness of breath: Secondary | ICD-10-CM | POA: Insufficient documentation

## 2011-06-09 DIAGNOSIS — R110 Nausea: Secondary | ICD-10-CM | POA: Insufficient documentation

## 2011-06-09 DIAGNOSIS — R079 Chest pain, unspecified: Secondary | ICD-10-CM

## 2011-06-09 DIAGNOSIS — J449 Chronic obstructive pulmonary disease, unspecified: Secondary | ICD-10-CM | POA: Diagnosis present

## 2011-06-09 DIAGNOSIS — G4733 Obstructive sleep apnea (adult) (pediatric): Secondary | ICD-10-CM | POA: Diagnosis present

## 2011-06-09 DIAGNOSIS — J45901 Unspecified asthma with (acute) exacerbation: Secondary | ICD-10-CM | POA: Insufficient documentation

## 2011-06-09 DIAGNOSIS — I10 Essential (primary) hypertension: Secondary | ICD-10-CM | POA: Diagnosis present

## 2011-06-09 DIAGNOSIS — J4489 Other specified chronic obstructive pulmonary disease: Secondary | ICD-10-CM | POA: Diagnosis present

## 2011-06-09 DIAGNOSIS — R059 Cough, unspecified: Secondary | ICD-10-CM | POA: Insufficient documentation

## 2011-06-09 DIAGNOSIS — R0789 Other chest pain: Principal | ICD-10-CM | POA: Insufficient documentation

## 2011-06-09 DIAGNOSIS — J438 Other emphysema: Secondary | ICD-10-CM

## 2011-06-09 DIAGNOSIS — M7989 Other specified soft tissue disorders: Secondary | ICD-10-CM

## 2011-06-09 DIAGNOSIS — J441 Chronic obstructive pulmonary disease with (acute) exacerbation: Secondary | ICD-10-CM | POA: Insufficient documentation

## 2011-06-09 DIAGNOSIS — IMO0001 Reserved for inherently not codable concepts without codable children: Secondary | ICD-10-CM | POA: Insufficient documentation

## 2011-06-09 LAB — DIFFERENTIAL
Basophils Absolute: 0 10*3/uL (ref 0.0–0.1)
Basophils Relative: 0 % (ref 0–1)
Eosinophils Absolute: 0.3 10*3/uL (ref 0.0–0.7)
Eosinophils Relative: 3 % (ref 0–5)
Monocytes Absolute: 1.1 10*3/uL — ABNORMAL HIGH (ref 0.1–1.0)

## 2011-06-09 LAB — BASIC METABOLIC PANEL
BUN: 8 mg/dL (ref 6–23)
Calcium: 8.9 mg/dL (ref 8.4–10.5)
GFR calc non Af Amer: >90 mL/min (ref >90)
Glucose, Bld: 101 mg/dL — ABNORMAL HIGH (ref 70–99)

## 2011-06-09 LAB — CBC
HCT: 35.3 % — ABNORMAL LOW (ref 36.0–46.0)
MCHC: 28.9 g/dL — ABNORMAL LOW (ref 30.0–36.0)
MCV: 78.4 fL (ref 78.0–100.0)
RDW: 17.1 % — ABNORMAL HIGH (ref 11.5–15.5)

## 2011-06-09 MED ORDER — SODIUM CHLORIDE 0.9 % IV SOLN
INTRAVENOUS | Status: DC
  Administered 2011-06-09: 19:00:00 via INTRAVENOUS

## 2011-06-09 MED ORDER — MOXIFLOXACIN HCL 400 MG PO TABS
400.0000 mg | ORAL_TABLET | Freq: Once | ORAL | Status: AC
  Administered 2011-06-09: 400 mg via ORAL

## 2011-06-09 MED ORDER — ALPRAZOLAM 0.5 MG PO TABS
1.0000 mg | ORAL_TABLET | Freq: Three times a day (TID) | ORAL | Status: DC
  Administered 2011-06-09 – 2011-06-10 (×2): 1 mg via ORAL
  Filled 2011-06-09 (×2): qty 2

## 2011-06-09 MED ORDER — PREDNISONE 20 MG PO TABS
60.0000 mg | ORAL_TABLET | Freq: Once | ORAL | Status: AC
  Administered 2011-06-09: 60 mg via ORAL

## 2011-06-09 NOTE — ED Provider Notes (Signed)
History     CSN: [id number]  Arrival date & time 06/09/11  1706   First MD Initiated Contact with Patient 06/09/11 1714      Chief Complaint  Patient presents with  . Chest Pain    (Consider location/radiation/quality/duration/timing/severity/associated sxs/prior treatment) Patient is a 46 y.o. female presenting with chest pain. The history is provided by the patient.  Chest Pain Primary symptoms include shortness of breath, cough and nausea. Pertinent negatives for primary symptoms include no abdominal pain and no vomiting.  Pertinent negatives for associated symptoms include no numbness and no weakness.    patient developed chest pain last night. It started in her right jaw markers were down to her retrosternal area. Patient states the pain resolved on the way here. She states it stopped before she was given nitroglycerin. She states she's had a cough, but has been improving on antibiotics. She's had a worsening dyspnea on exertion. She states that she has worsening swelling in her legs also.  Past Medical History  Diagnosis Date  . Asthma   . Hypertension   . COPD (chronic obstructive pulmonary disease)     +/- asthma   . Obesity   . Manic, depressive   . Enlarged heart   . Fibromyalgia     History reviewed. No pertinent past surgical history.  No family history on file.  History  Substance Use Topics  . Smoking status: Former Smoker -- 2.0 packs/day for 15 years    Types: Cigarettes    Quit date: 05/24/2002  . Smokeless tobacco: Not on file  . Alcohol Use: No    OB History    Grav Para Term Preterm Abortions TAB SAB Ect Mult Living                  Review of Systems  Constitutional: Negative for activity change and appetite change.  HENT: Negative for neck stiffness.   Eyes: Negative for pain.  Respiratory: Positive for cough and shortness of breath. Negative for chest tightness.   Cardiovascular: Positive for chest pain and leg swelling.    Gastrointestinal: Positive for nausea. Negative for vomiting, abdominal pain and diarrhea.  Genitourinary: Negative for flank pain.  Musculoskeletal: Negative for back pain.  Skin: Negative for rash.  Neurological: Negative for weakness, numbness and headaches.  Psychiatric/Behavioral: Negative for behavioral problems.    Allergies  Review of patient's allergies indicates no known allergies.  Home Medications   Current Outpatient Rx  Name Route Sig Dispense Refill  . ALBUTEROL SULFATE HFA 108 (90 BASE) MCG/ACT IN AERS Inhalation Inhale 2 puffs into the lungs every 6 (six) hours as needed. wheezing     . ALPRAZOLAM 1 MG PO TABS Oral Take 1 tablet (1 mg total) by mouth 3 (three) times daily. 30 tablet 0  . CLONIDINE HCL 0.2 MG PO TABS Oral Take 1 tablet (0.2 mg total) by mouth 2 (two) times daily. 60 tablet 2  . FUROSEMIDE 40 MG PO TABS Oral Take 80 mg by mouth 2 (two) times daily.    . GUAIFENESIN ER 600 MG PO TB12 Oral Take 1 tablet (600 mg total) by mouth 2 (two) times daily as needed for congestion.    Marland Kitchen HYDRALAZINE HCL 50 MG PO TABS Oral Take 1 tablet (50 mg total) by mouth every 8 (eight) hours. 90 tablet 0  . LOSARTAN POTASSIUM 100 MG PO TABS Oral Take 1 tablet (100 mg total) by mouth daily. 30 tablet 0  . METOPROLOL TARTRATE 50 MG  PO TABS Oral Take 100 mg by mouth 2 (two) times daily.     Marland Kitchen MOXIFLOXACIN HCL 400 MG PO TABS Oral Take 1 tablet (400 mg total) by mouth daily at 6 PM. 5 tablet 0  . PREDNISONE (PAK) 10 MG PO TABS  Take 6 tabs 5/1, then decrease by 10 mg daily until off. 21 tablet 0  . TRAMADOL HCL 50 MG PO TABS Oral Take 50 mg by mouth every 8 (eight) hours as needed. For pain.    . TRAZODONE HCL 100 MG PO TABS Oral Take by mouth at bedtime.    . ASPIRIN 81 MG PO TBEC Oral Take 1 tablet (81 mg total) by mouth daily. Swallow whole. 30 tablet 12  . PANTOPRAZOLE SODIUM 40 MG PO TBEC Oral Take 1 tablet (40 mg total) by mouth daily. 30 tablet 0    BP 136/88  Pulse 62   Temp(Src) 98.1 F (36.7 C) (Oral)  Resp 22  Ht 5\' 4"  (1.626 m)  Wt 403 lb (182.8 kg)  BMI 69.17 kg/m2  SpO2 99%  LMP 05/17/2011  Physical Exam  Nursing note and vitals reviewed. Constitutional: She is oriented to person, place, and time. She appears well-developed and well-nourished.  HENT:  Head: Normocephalic and atraumatic.  Eyes: EOM are normal. Pupils are equal, round, and reactive to light.  Neck: Normal range of motion. Neck supple.  Cardiovascular: Normal rate, regular rhythm and normal heart sounds.   No murmur heard. Pulmonary/Chest: Effort normal and breath sounds normal. No respiratory distress. She has no wheezes. She has no rales.       Mildly harsh breath sounds throughout.  Abdominal: Soft. Bowel sounds are normal. She exhibits no distension. There is no tenderness. There is no rebound and no guarding.  Musculoskeletal: Normal range of motion. She exhibits edema.       Moderate edema of bilateral lower extremities.  Neurological: She is alert and oriented to person, place, and time. No cranial nerve deficit.  Skin: Skin is warm and dry.  Psychiatric: She has a normal mood and affect. Her speech is normal.    ED Course  Procedures (including critical care time)  Labs Reviewed  CBC - Abnormal; Notable for the following:    Hemoglobin 10.2 (*)    HCT 35.3 (*)    MCH 22.7 (*)    MCHC 28.9 (*)    RDW 17.1 (*)    All other components within normal limits  DIFFERENTIAL - Abnormal; Notable for the following:    Monocytes Absolute 1.1 (*)    All other components within normal limits  PRO B NATRIURETIC PEPTIDE - Abnormal; Notable for the following:    Pro B Natriuretic peptide (BNP) 339.7 (*)    All other components within normal limits  BASIC METABOLIC PANEL - Abnormal; Notable for the following:    Glucose, Bld 101 (*)    All other components within normal limits  GLUCOSE, CAPILLARY - Abnormal; Notable for the following:    Glucose-Capillary 120 (*)    All  other components within normal limits  BASIC METABOLIC PANEL - Abnormal; Notable for the following:    Glucose, Bld 100 (*)    All other components within normal limits  CBC - Abnormal; Notable for the following:    Hemoglobin 11.0 (*)    MCH 22.6 (*)    MCHC 28.6 (*)    RDW 17.4 (*)    All other components within normal limits  GLUCOSE, CAPILLARY - Abnormal; Notable  for the following:    Glucose-Capillary 119 (*)    All other components within normal limits  GLUCOSE, CAPILLARY - Abnormal; Notable for the following:    Glucose-Capillary 152 (*)    All other components within normal limits  TROPONIN I  CARDIAC PANEL(CRET KIN+CKTOT+MB+TROPI)  CARDIAC PANEL(CRET KIN+CKTOT+MB+TROPI)  LAB REPORT - SCANNED   No results found.   1. COPD with acute exacerbation   2. Chest pain   3. HTN (hypertension)   4. Morbid obesity      Date: 06/09/2011  Rate: 77  Rhythm: normal sinus rhythm  QRS Axis: normal  Intervals: normal  ST/T Wave abnormalities: nonspecific T wave changes  Conduction Disutrbances:none  Narrative Interpretation:   Old EKG Reviewed: changes noted    MDM  Patient with chest pain. Nonspecific T wave changes on EKG. Has been on Avelox. She'll be admitted to medicine for COPD and chest pain rule out.        Juliet Rude. Rubin Payor, MD 06/13/11 2355

## 2011-06-09 NOTE — ED Notes (Signed)
CT called to pick up pt

## 2011-06-09 NOTE — ED Notes (Signed)
Pt to CT.  Given her daily xanax d/t anxiety r/t IV stick.

## 2011-06-09 NOTE — H&P (Signed)
PCP:  [facility] Carolanne Grumbling, cardiology Shan Levans, pulmonary   Chief Complaint:  Chest pain  HPI: 45yoF with h/o severe morbid obesity, asthma/COPD and 2 admissions in 05/2011  for COPD/asthma/cough/wheezing presents with chest pain.   This is the pt's 3rd admission this month. First from 4/4 to 4/11 she presented with several months of wheezing, productive cough. CXR showed  interstitial edema. Pulmonary saw her, started cyclic cough protocol. Given  nebs, solumedrol, O2, cough suppressant. Echo was EF 65%, LVH. Discharged on  O2 given drop to 88% and 2 week steroid taper. HTN difficult to control but  maxed out home meds. Then comes back on 4/25 to 4/30 for basically the same  and treated with nebulizers q2 PRN, steroids, CPAP, azithromycin,  Ceftriaxone, discharged on prednisone and avelox.   Pt is fairly reliable historian. States that starting night of 5/2 she  developed left breast, substernal chest pressure that was particularly  severe and prevented her from sleeping, lasted all day 5/3 for which she  called PCP's office and was directed to ED. At times oddly described, but  appears to radiate into her back, and possibly "down" from her right side of  neck into chest with associated right neck numbness. No other associated  symptoms, and there is no clear pattern of exertional worsening or relief  with rest, but she states when she walks there is a "pulling down" sensation  in her chest. No clear SOB with all this, and she seems to have improved  from the recent pulmonary issues and curerntly on avelox and prednisone.   In the ED vitals were stable. Labs with normal chem panel, Trop negative BNP  339 stable. CBC stable. CXR with stable cardiomegaly and chronic  bronchitis/asthma, nothing acute. Pt was given 400 mg avelox and 60 mg  prednisone.   ROS also with BLE edema that appears to steadily get worse over the past  months. Also with increased gassiness and  belching, and pt does endorse h/o  GERD but states this is different and worse. ROS otherwise stable or  unremarakable.    Past Medical History  Diagnosis Date  . Asthma   . Hypertension   . COPD (chronic obstructive pulmonary disease)     +/- asthma   . Obesity   . Manic, depressive   . Enlarged heart   . Fibromyalgia     History reviewed. No pertinent past surgical history.  Medications:  HOME MEDS: Reconciled with patient  Prior to Admission medications   Medication Sig Start Date End Date Taking? Authorizing Provider  albuterol (PROVENTIL HFA;VENTOLIN HFA) 108 (90 BASE) MCG/ACT inhaler Inhale 2 puffs into the lungs every 6 (six) hours as needed. wheezing    Yes Historical Provider, MD  ALPRAZolam (XANAX) 1 MG tablet Take 1 tablet (1 mg total) by mouth 3 (three) times daily. 05/18/11 06/17/11 Yes Lesle Chris Black, NP  cloNIDine (CATAPRES) 0.2 MG tablet Take 1 tablet (0.2 mg total) by mouth 2 (two) times daily. 06/06/11 06/05/12 Yes Christina P Rama, MD  furosemide (LASIX) 40 MG tablet Take 80 mg by mouth 2 (two) times daily. 05/18/11 05/17/12 Yes Lesle Chris Black, NP  guaiFENesin (MUCINEX) 600 MG 12 hr tablet Take 1 tablet (600 mg total) by mouth 2 (two) times daily as needed for congestion. 06/06/11 06/05/12 Yes Christina P Rama, MD  hydrALAZINE (APRESOLINE) 50 MG tablet Take 1 tablet (50 mg total) by mouth every 8 (eight) hours. 05/18/11 05/17/12 Yes Gwenyth Bender, NP  losartan (COZAAR) 100 MG tablet Take 1 tablet (100 mg total) by mouth daily. 05/18/11 05/17/12 Yes Lesle Chris Black, NP  metoprolol (LOPRESSOR) 50 MG tablet Take 100 mg by mouth 2 (two) times daily.  05/18/11 05/17/12 Yes Lesle Chris Black, NP  moxifloxacin (AVELOX) 400 MG tablet Take 1 tablet (400 mg total) by mouth daily at 6 PM. 06/06/11 06/16/11 Yes Christina P Rama, MD  predniSONE (STERAPRED UNI-PAK) 10 MG tablet Take 6 tabs 5/1, then decrease by 10 mg daily until off. 06/06/11 06/16/11 Yes Christina P Rama, MD  traMADol (ULTRAM) 50 MG  tablet Take 50 mg by mouth every 8 (eight) hours as needed. For pain.   Yes Historical Provider, MD  traZODone (DESYREL) 100 MG tablet Take by mouth at bedtime.   Yes Historical Provider, MD    Allergies:  No Known Allergies  Social History:   reports that she quit smoking about 9 years ago. Her smoking use included Cigarettes. She has a 30 pack-year smoking history. She does not have any smokeless tobacco history on file. She reports that she does not drink alcohol or use illicit drugs. She lives at home and has a daughter and grandchildren, and a mother.   Family History: No family history on file.  Physical Exam: Filed Vitals:   06/09/11 1723 06/09/11 1852 06/09/11 2130  BP: 121/68 146/77 134/73  Pulse: 76 68 74  Temp: 98.7 F (37.1 C)    TempSrc: Oral    Resp: 16 18 20   SpO2: 98% 97% 100%   Blood pressure 134/73, pulse 74, temperature 98.7 F (37.1 C), temperature source Oral, resp. rate 20, last menstrual period 05/17/2011, SpO2 100.00%. Gen: Very obese F first seen walking back from bathroom in the halls, gait  stable but is huffing and puffing, gets back to room and sits down, able to  speak in half sentences but no increased WOB or accessory muscle use. Is  pleasant, able to relate history pretty well, not distressed HEENT: Sclera are very injected, but pupils round, reactive ~84mm, equal,  EOMI. Mouth moist and normal with teeth in place.  Lungs: Poorly heard due to habitus, but grossly normal, again, limited exam  but no gross wheezes crackles rales Heart: Regular, S1/2 apprecaited, not tachycardic, crescendo/decrescendo  murmur heard at BUSB's, no heaves noted Abd: Obese with pannus noted, but not tender, tight, or distended, benign  overall Extrem: Increased bulk and girth, but warm and perfusing well, radials  palpable moderately well, there is BLE edema going up to the thighs  bilaterally, with petechiae and a bit of erythema, venous stasis dermatitis. Neuro:  Alert, attentive conversant, CN intact without drooping, slurring, no  aphasia, moves extremiteis on her own, gait normal, grossly non-focal    Labs & Imaging Results for orders placed during the hospital encounter of 06/09/11 (from the past 48 hour(s))  CBC     Status: Abnormal   Collection Time   06/09/11  6:11 PM      Component Value Range Comment   WBC 10.5  4.0 - 10.5 (K/uL)    RBC 4.50  3.87 - 5.11 (MIL/uL)    Hemoglobin 10.2 (*) 12.0 - 15.0 (g/dL)    HCT 78.4 (*) 69.6 - 46.0 (%)    MCV 78.4  78.0 - 100.0 (fL)    MCH 22.7 (*) 26.0 - 34.0 (pg)    MCHC 28.9 (*) 30.0 - 36.0 (g/dL)    RDW 29.5 (*) 28.4 - 15.5 (%)    Platelets 249  150 - 400 (K/uL)   DIFFERENTIAL     Status: Abnormal   Collection Time   06/09/11  6:11 PM      Component Value Range Comment   Neutrophils Relative 69  43 - 77 (%)    Neutro Abs 7.2  1.7 - 7.7 (K/uL)    Lymphocytes Relative 18  12 - 46 (%)    Lymphs Abs 1.9  0.7 - 4.0 (K/uL)    Monocytes Relative 10  3 - 12 (%)    Monocytes Absolute 1.1 (*) 0.1 - 1.0 (K/uL)    Eosinophils Relative 3  0 - 5 (%)    Eosinophils Absolute 0.3  0.0 - 0.7 (K/uL)    Basophils Relative 0  0 - 1 (%)    Basophils Absolute 0.0  0.0 - 0.1 (K/uL)   PRO B NATRIURETIC PEPTIDE     Status: Abnormal   Collection Time   06/09/11  6:11 PM      Component Value Range Comment   Pro B Natriuretic peptide (BNP) 339.7 (*) 0 - 125 (pg/mL)   TROPONIN I     Status: Normal   Collection Time   06/09/11  6:11 PM      Component Value Range Comment   Troponin I <0.30  <0.30 (ng/mL)   BASIC METABOLIC PANEL     Status: Abnormal   Collection Time   06/09/11  6:11 PM      Component Value Range Comment   Sodium 138  135 - 145 (mEq/L)    Potassium 3.8  3.5 - 5.1 (mEq/L)    Chloride 99  96 - 112 (mEq/L)    CO2 29  19 - 32 (mEq/L)    Glucose, Bld 101 (*) 70 - 99 (mg/dL)    BUN 8  6 - 23 (mg/dL)    Creatinine, Ser 1.61  0.50 - 1.10 (mg/dL)    Calcium 8.9  8.4 - 10.5 (mg/dL)    GFR calc non Af Amer >90   >90 (mL/min)    GFR calc Af Amer >90  >90 (mL/min)    Dg Chest 2 View  06/09/2011  *RADIOLOGY REPORT*  Clinical Data: Chest pain.  History of diabetes, hypertension, COPD, and MI.  CHEST - 2 VIEW 06/09/2011:  Comparison: Portable chest x-ray 06/01/2011, 08/31/2010, and two- view chest x-ray 05/12/2011, 05/03/2009.  Findings: Suboptimal inspiration due to body habitus which accounts for crowded bronchovascular markings at the bases and accentuates the cardiac silhouette.  Taking this into account, cardiac silhouette enlarged but stable, allowing for differences in technique.  Thoracic aorta tortuous and atherosclerotic, unchanged. Hilar and mediastinal contours otherwise unremarkable.  Lungs clear.  Mildly prominent bronchovascular markings diffusely, improved since the most recent prior examination.  No new pulmonary parenchymal abnormalities.  No pleural effusions.  Degenerative changes involving the thoracic spine.  IMPRESSION: Suboptimal inspiration.  Stable cardiomegaly and chronic bronchitis/asthma.  No acute cardiopulmonary disease.  Original Report Authenticated By: Arnell Sieving, M.D.     05/12/2011 Study Conclusions - Left ventricle: The cavity size was normal. Wall thickness   was increased in a pattern of moderate LVH. The estimated   ejection fraction was 65%. Wall motion was normal; there   were no regional wall motion abnormalities. Left   ventricular diastolic function parameters were normal. - Mitral valve: Mild regurgitation. - Left atrium: The atrium was mildly dilated.   ECG: NSR 77 bpm, normal axis, normal P and PR, narrow QRS, possible lateral  contiguous narrow Q waves.  No ST deviations. Minimal TW inversion V5-6. It  appears similar to prior in V6.    Impression Present on Admission:  .COPD with acute exacerbation .HTN (hypertension) .Morbid obesity .Diastolic CHF, chronic .OSA (obstructive sleep apnea) .Chest pain  45yoF with h/o severe morbid obesity,  asthma/COPD and 2 admissions in 05/2011  for COPD/asthma/cough/wheezing presents with chest pain.   1. Chest pain: Does not sound typical for angina but pt with RF's of HTN,  obesity, so rule out MI reasonable. Given history of severe HTN (although  not currently that hypertense) and ? radiation into back and neck,  dissection considered and should be ruled out. PE also considered. Acid  reflux (endorses h/o GERD) and COPD/asthma and air trapping also considered,  however pt states the lung issues for which she was admitted twice this  month is actually overall improving. Finally, pt states she has fibromyalgia  so MSK etiology considered.  Overall, I think this will end up benign but given her clinical picture, I  think due diligence warrants ruling out severe etiology.   - CTA chest (renal normal), and admit observation for rule out MI protocol  with enzymes and repeat ECG - ASA 325 and trial of SL NTG, morphine for pain, O2  2. BLE edema: BNP only minimally elevated, CXR without edema, I think she's  in chronic dCHF not acute on chronic. While admitted, will give some IV  lasix (40 mg IV q8 x3 doses, was just recently increased from 40 BID to 80  BID) as her BLE's are quite edematous  3. COPD/asthma: Per pt, overall improved. Therefore will not initiate any  different management than prior from last d/c, continue home avelox and  prednisone. Nebs PRN   4. Severe HTN: this was previously hard to manage per d/c summaries. At  present, not too bad.  - Continue home clonidine, metoprolol, hydralazine, losartan   Lovenox prophy  Telemetry, MC team 8 Presumed full code   Other plans as per orders.   Jaleena Viviani 06/09/2011, 10:42 PM

## 2011-06-09 NOTE — ED Notes (Signed)
PT stated that she is taking avelox and predinisone for pnx.  Dr Rubin Payor notified.  Pt with 1 non-productive coughing episode.  Sub-sternal pain increased temporarily after episode.

## 2011-06-09 NOTE — ED Notes (Signed)
Dr Bonno at bedside. 

## 2011-06-09 NOTE — ED Notes (Signed)
IV Team paged, second IV needed to perform CT angiogram.

## 2011-06-09 NOTE — ED Notes (Signed)
To ED from home via EMS, CP onset last night, reports sub-sternal pain with SOB, pain decreased from 9/10 to 6/10 enroute, pt took 324 ASA and 1 nitro pta, VSS, NAD

## 2011-06-10 ENCOUNTER — Encounter (HOSPITAL_COMMUNITY): Payer: Self-pay | Admitting: *Deleted

## 2011-06-10 ENCOUNTER — Observation Stay (HOSPITAL_COMMUNITY): Payer: Self-pay

## 2011-06-10 DIAGNOSIS — M7989 Other specified soft tissue disorders: Secondary | ICD-10-CM

## 2011-06-10 DIAGNOSIS — R079 Chest pain, unspecified: Secondary | ICD-10-CM

## 2011-06-10 DIAGNOSIS — J438 Other emphysema: Secondary | ICD-10-CM

## 2011-06-10 LAB — CBC
HCT: 38.5 % (ref 36.0–46.0)
Hemoglobin: 11 g/dL — ABNORMAL LOW (ref 12.0–15.0)
MCH: 22.6 pg — ABNORMAL LOW (ref 26.0–34.0)
MCV: 79.1 fL (ref 78.0–100.0)
Platelets: 303 10*3/uL (ref 150–400)
RBC: 4.87 MIL/uL (ref 3.87–5.11)
WBC: 8.2 10*3/uL (ref 4.0–10.5)

## 2011-06-10 LAB — GLUCOSE, CAPILLARY
Glucose-Capillary: 120 mg/dL — ABNORMAL HIGH (ref 70–99)
Glucose-Capillary: 119 mg/dL — ABNORMAL HIGH (ref 70–99)
Glucose-Capillary: 152 mg/dL — ABNORMAL HIGH (ref 70–99)

## 2011-06-10 LAB — CARDIAC PANEL(CRET KIN+CKTOT+MB+TROPI)
CK, MB: 3.3 ng/mL (ref 0.3–4.0)
Relative Index: INVALID (ref 0.0–2.5)
Total CK: 60 U/L (ref 7–177)
Total CK: 51 U/L (ref 7–177)

## 2011-06-10 LAB — BASIC METABOLIC PANEL
CO2: 30 mEq/L (ref 19–32)
Chloride: 98 mEq/L (ref 96–112)
Glucose, Bld: 100 mg/dL — ABNORMAL HIGH (ref 70–99)
Potassium: 4.1 mEq/L (ref 3.5–5.1)
Sodium: 137 mEq/L (ref 135–145)

## 2011-06-10 MED ORDER — ACETAMINOPHEN 325 MG PO TABS: 650.0000 mg | ORAL_TABLET | Freq: Four times a day (QID) | ORAL | Status: DC | PRN

## 2011-06-10 MED ORDER — ACETAMINOPHEN 650 MG RE SUPP: 650.0000 mg | Freq: Four times a day (QID) | RECTAL | Status: DC | PRN

## 2011-06-10 MED ORDER — LOSARTAN POTASSIUM 50 MG PO TABS
100.0000 mg | ORAL_TABLET | Freq: Every day | ORAL | Status: DC
  Administered 2011-06-10: 100 mg via ORAL
  Filled 2011-06-10: qty 2

## 2011-06-10 MED ORDER — SODIUM CHLORIDE 0.9 % IJ SOLN: 3.0000 mL | INTRAMUSCULAR | Status: DC | PRN

## 2011-06-10 MED ORDER — SODIUM CHLORIDE 0.9 % IJ SOLN: 3.0000 mL | Freq: Two times a day (BID) | INTRAMUSCULAR | Status: DC

## 2011-06-10 MED ORDER — HYDRALAZINE HCL 50 MG PO TABS
50.0000 mg | ORAL_TABLET | Freq: Three times a day (TID) | ORAL | Status: DC
  Administered 2011-06-10: 50 mg via ORAL
  Filled 2011-06-10 (×4): qty 1

## 2011-06-10 MED ORDER — PREDNISONE 20 MG PO TABS
30.0000 mg | ORAL_TABLET | Freq: Once | ORAL | Status: DC
  Filled 2011-06-10: qty 1

## 2011-06-10 MED ORDER — TRAMADOL HCL 50 MG PO TABS
50.0000 mg | ORAL_TABLET | Freq: Four times a day (QID) | ORAL | Status: DC | PRN
  Administered 2011-06-10: 50 mg via ORAL
  Filled 2011-06-10: qty 1

## 2011-06-10 MED ORDER — ONDANSETRON HCL 4 MG PO TABS: 4.0000 mg | ORAL_TABLET | Freq: Four times a day (QID) | ORAL | Status: DC | PRN

## 2011-06-10 MED ORDER — PNEUMOCOCCAL VAC POLYVALENT 25 MCG/0.5ML IJ INJ: 0.5000 mL | INJECTION | INTRAMUSCULAR | Status: DC

## 2011-06-10 MED ORDER — NITROGLYCERIN 0.4 MG SL SUBL: 0.4000 mg | SUBLINGUAL_TABLET | SUBLINGUAL | Status: DC | PRN

## 2011-06-10 MED ORDER — PREDNISONE 10 MG PO TABS: 10.0000 mg | ORAL_TABLET | Freq: Once | ORAL | Status: DC

## 2011-06-10 MED ORDER — METOPROLOL TARTRATE 100 MG PO TABS
100.0000 mg | ORAL_TABLET | Freq: Two times a day (BID) | ORAL | Status: DC
  Administered 2011-06-10 (×2): 100 mg via ORAL
  Filled 2011-06-10: qty 4
  Filled 2011-06-10 (×2): qty 1

## 2011-06-10 MED ORDER — FUROSEMIDE 10 MG/ML IJ SOLN
40.0000 mg | Freq: Three times a day (TID) | INTRAMUSCULAR | Status: DC
  Administered 2011-06-10 (×2): 40 mg via INTRAVENOUS
  Filled 2011-06-10 (×3): qty 4

## 2011-06-10 MED ORDER — SODIUM CHLORIDE 0.9 % IV SOLN: 250.0000 mL | INTRAVENOUS | Status: DC | PRN

## 2011-06-10 MED ORDER — ENOXAPARIN SODIUM 100 MG/ML ~~LOC~~ SOLN
90.0000 mg | Freq: Every day | SUBCUTANEOUS | Status: DC
  Administered 2011-06-10: 90 mg via SUBCUTANEOUS
  Filled 2011-06-10: qty 1

## 2011-06-10 MED ORDER — HYDRALAZINE HCL 50 MG PO TABS
50.0000 mg | ORAL_TABLET | Freq: Once | ORAL | Status: AC
  Administered 2011-06-10: 50 mg via ORAL
  Filled 2011-06-10: qty 1

## 2011-06-10 MED ORDER — ALBUTEROL SULFATE (5 MG/ML) 0.5% IN NEBU: 2.5000 mg | INHALATION_SOLUTION | Freq: Four times a day (QID) | RESPIRATORY_TRACT | Status: DC | PRN

## 2011-06-10 MED ORDER — PANTOPRAZOLE SODIUM 40 MG PO TBEC: 40.0000 mg | DELAYED_RELEASE_TABLET | Freq: Every day | ORAL | Status: DC

## 2011-06-10 MED ORDER — POLYETHYLENE GLYCOL 3350 17 G PO PACK
17.0000 g | PACK | Freq: Every day | ORAL | Status: DC | PRN
  Filled 2011-06-10: qty 1

## 2011-06-10 MED ORDER — MOXIFLOXACIN HCL 400 MG PO TABS
400.0000 mg | ORAL_TABLET | Freq: Every day | ORAL | Status: DC
  Filled 2011-06-10: qty 1

## 2011-06-10 MED ORDER — PREDNISONE 20 MG PO TABS: 20.0000 mg | ORAL_TABLET | Freq: Once | ORAL | Status: DC

## 2011-06-10 MED ORDER — ASPIRIN EC 325 MG PO TBEC
325.0000 mg | DELAYED_RELEASE_TABLET | Freq: Every day | ORAL | Status: DC
  Administered 2011-06-10: 325 mg via ORAL
  Filled 2011-06-10: qty 1

## 2011-06-10 MED ORDER — IPRATROPIUM BROMIDE 0.02 % IN SOLN: 0.5000 mg | Freq: Four times a day (QID) | RESPIRATORY_TRACT | Status: DC | PRN

## 2011-06-10 MED ORDER — TRAZODONE HCL 50 MG PO TABS
50.0000 mg | ORAL_TABLET | Freq: Every evening | ORAL | Status: DC | PRN
  Filled 2011-06-10: qty 1

## 2011-06-10 MED ORDER — ASPIRIN 81 MG PO TBEC: 81.0000 mg | DELAYED_RELEASE_TABLET | Freq: Every day | ORAL | Status: DC

## 2011-06-10 MED ORDER — PREDNISONE (PAK) 10 MG PO TABS: ORAL_TABLET | Freq: Every day | ORAL | Status: DC

## 2011-06-10 MED ORDER — IOHEXOL 350 MG/ML SOLN
100.0000 mL | Freq: Once | INTRAVENOUS | Status: AC | PRN
  Administered 2011-06-10: 100 mL via INTRAVENOUS

## 2011-06-10 MED ORDER — PREDNISONE 20 MG PO TABS
40.0000 mg | ORAL_TABLET | Freq: Once | ORAL | Status: AC
  Administered 2011-06-10: 40 mg via ORAL
  Filled 2011-06-10 (×2): qty 2

## 2011-06-10 MED ORDER — SENNA 8.6 MG PO TABS
1.0000 | ORAL_TABLET | Freq: Two times a day (BID) | ORAL | Status: DC
  Administered 2011-06-10: 8.6 mg via ORAL
  Filled 2011-06-10 (×2): qty 1

## 2011-06-10 MED ORDER — ONDANSETRON HCL 4 MG/2ML IJ SOLN: 4.0000 mg | Freq: Four times a day (QID) | INTRAMUSCULAR | Status: DC | PRN

## 2011-06-10 MED ORDER — DOCUSATE SODIUM 100 MG PO CAPS
100.0000 mg | ORAL_CAPSULE | Freq: Two times a day (BID) | ORAL | Status: DC
  Administered 2011-06-10: 100 mg via ORAL
  Filled 2011-06-10 (×2): qty 1

## 2011-06-10 MED ORDER — CLONIDINE HCL 0.2 MG PO TABS
0.2000 mg | ORAL_TABLET | Freq: Two times a day (BID) | ORAL | Status: DC
  Administered 2011-06-10 (×2): 0.2 mg via ORAL
  Filled 2011-06-10: qty 2
  Filled 2011-06-10 (×2): qty 1

## 2011-06-10 NOTE — ED Notes (Signed)
Pt back from CT.  Continues to deny chest pain but c/o chronic pain r/t fibromyalgia.

## 2011-06-10 NOTE — Evaluation (Signed)
Physical Therapy Evaluation and Discharge Patient Details Name: Jenna Chandler MRN: [id number] DOB: 08/21/1965 Today's Date: 06/10/2011 Time: 0725-0748 PT Time Calculation (min): 23 min  PT Assessment / Plan / Recommendation Clinical Impression  45yoF with h/o severe morbid obesity, asthma/COPD and 2 admissions in 05/2011.  Pt presents with Independent mobility today.  SOB with activity. Pt on 3L O2 at home. Placed pt on 3 L O2 via [place] at completion of session.       PT Assessment  Patent does not need any further PT services    Follow Up Recommendations  No PT follow up    Equipment Recommendations  None recommended by PT    Frequency      Precautions / Restrictions Precautions Precautions: Fall Restrictions Weight Bearing Restrictions: No Other Position/Activity Restrictions: on 3L 02   Pertinent Vitals/Pain Pt denies chest pain.      Mobility  Bed Mobility Bed Mobility: Supine to Sit Supine to Sit: 6: Modified independent (Device/Increase time);HOB elevated Sit to Supine: Not Tested (comment) Transfers Transfers: Sit to Stand;Stand to Sit Sit to Stand: 7: Independent;From bed Stand to Sit: 7: Independent;To bed Ambulation/Gait Ambulation/Gait Assistance: 7: Independent Ambulation Distance (Feet): 120 Feet Assistive device: Rolling walker Ambulation/Gait Assistance Details: Presented with SOB at 60 feet O2 sats 96%.  Pt able to continue another 60 feet HR as high as 108, O2 sats 95 on room air.  Provided pt with supplimental O2 sats improved quickly. Gait Pattern: Within Functional Limits Gait velocity: WFL Stairs: No Wheelchair Mobility Wheelchair Mobility: No    Exercises     PT Goals    Visit Information  Last PT Received On: 06/10/11 Assistance Needed: +1    Subjective Data  Subjective: Pt sitting EOB, "I need to use the bathroom"- has been walking to bathroom without assistance   Prior Functioning  Home Living Lives With: Family;Other  (Comment) Available Help at Discharge: Family Type of Home: House Home Access: Level entry Home Layout: One level Bathroom Shower/Tub: Tub/shower unit;C[employer]ain Firefighter: Standard Home Adaptive Equipment: Other (comment) Additional Comments: Pt reports she usually takes care of household chores for her mother and sometimes provides physical assist.  Pt on 2L O2 at home all the time. Prior Function Level of Independence: Needs assistance Needs Assistance: Bathing;Dressing Bath: Other (comment) Dressing: Other (comment) Communication Communication: No difficulties Dominant Hand: Right    Cognition  Overall Cognitive Status: Appears within functional limits for tasks assessed/performed Arousal/Alertness: Awake/alert Orientation Level: Appears intact for tasks assessed Behavior During Session: [facility] for tasks performed    Extremity/Trunk Assessment Right Upper Extremity Assessment RUE ROM/Strength/Tone: [facility] for tasks assessed Left Upper Extremity Assessment LUE ROM/Strength/Tone: [facility] for tasks assessed Right Lower Extremity Assessment RLE ROM/Strength/Tone: [facility] for tasks assessed Left Lower Extremity Assessment LLE ROM/Strength/Tone: [facility] for tasks assessed   Balance Balance Balance Assessed: No  End of Session PT - End of Session Equipment Utilized During Treatment: Oxygen Activity Tolerance: Patient tolerated treatment well Patient left: in bed;with call bell/phone within reach Nurse Communication: Mobility status   Deshawna Mcneece 06/10/2011, 8:02 AM Theron Arista L. Dock Baccam DPT [phone]

## 2011-06-10 NOTE — Discharge Summary (Signed)
Admit date: 06/09/2011 Discharge date: 06/10/2011  Primary Care Physician:  No primary provider on file.   Discharge Diagnoses:    . Chest pain, atypical, probably muscle skeletal.  06/09/2011   . OSA (obstructive sleep apnea) 06/02/2011   . Diastolic CHF, chronic 06/01/2011   . COPD with acute exacerbation 05/11/2011   . HTN (hypertension) 05/11/2011   . Morbid obesity 05/11/2011      DISCHARGE MEDICATION: Medication List  As of 06/10/2011  8:10 AM   STOP taking these medications         HYDROcodone-acetaminophen 5-325 MG per tablet         TAKE these medications         albuterol 108 (90 BASE) MCG/ACT inhaler   Commonly known as: PROVENTIL HFA;VENTOLIN HFA   Inhale 2 puffs into the lungs every 6 (six) hours as needed. wheezing      ALPRAZolam 1 MG tablet   Commonly known as: XANAX   Take 1 tablet (1 mg total) by mouth 3 (three) times daily.      aspirin 81 MG EC tablet   Take 1 tablet (81 mg total) by mouth daily. Swallow whole.      cloNIDine 0.2 MG tablet   Commonly known as: CATAPRES   Take 1 tablet (0.2 mg total) by mouth 2 (two) times daily.      furosemide 40 MG tablet   Commonly known as: LASIX   Take 80 mg by mouth 2 (two) times daily.      guaiFENesin 600 MG 12 hr tablet   Commonly known as: MUCINEX   Take 1 tablet (600 mg total) by mouth 2 (two) times daily as needed for congestion.      hydrALAZINE 50 MG tablet   Commonly known as: APRESOLINE   Take 1 tablet (50 mg total) by mouth every 8 (eight) hours.      losartan 100 MG tablet   Commonly known as: COZAAR   Take 1 tablet (100 mg total) by mouth daily.      metoprolol 50 MG tablet   Commonly known as: LOPRESSOR   Take 100 mg by mouth 2 (two) times daily.      moxifloxacin 400 MG tablet   Commonly known as: AVELOX   Take 1 tablet (400 mg total) by mouth daily at 6 PM.      pantoprazole 40 MG tablet   Commonly known as: PROTONIX   Take 1 tablet (40 mg total) by mouth daily.      predniSONE  10 MG tablet   Commonly known as: STERAPRED UNI-PAK   Take 6 tabs 5/1, then decrease by 10 mg daily until off.      traMADol 50 MG tablet   Commonly known as: ULTRAM   Take 50 mg by mouth every 8 (eight) hours as needed. For pain.      traZODone 100 MG tablet   Commonly known as: DESYREL   Take by mouth at bedtime.              Consults:  none   SIGNIFICANT DIAGNOSTIC STUDIES:  Dg Chest 2 View  06/09/2011  *RADIOLOGY REPORT*  Clinical Data: Chest pain.  History of diabetes, hypertension, COPD, and MI.  CHEST - 2 VIEW 06/09/2011:  Comparison: Portable chest x-ray 06/01/2011, 08/31/2010, and two- view chest x-ray 05/12/2011, 05/03/2009.  Findings: Suboptimal inspiration due to body habitus which accounts for crowded bronchovascular markings at the bases and accentuates the cardiac silhouette.  Taking this  into account, cardiac silhouette enlarged but stable, allowing for differences in technique.  Thoracic aorta tortuous and atherosclerotic, unchanged. Hilar and mediastinal contours otherwise unremarkable.  Lungs clear.  Mildly prominent bronchovascular markings diffusely, improved since the most recent prior examination.  No new pulmonary parenchymal abnormalities.  No pleural effusions.  Degenerative changes involving the thoracic spine.  IMPRESSION: Suboptimal inspiration.  Stable cardiomegaly and chronic bronchitis/asthma.  No acute cardiopulmonary disease.  Original Report Authenticated By: Arnell Sieving, M.D.   X-ray Chest Pa And Lateral   05/12/2011  *RADIOLOGY REPORT*  Clinical Data: Productive cough  CHEST - 2 VIEW  Comparison: 05/11/2011 and 02/26/2011  Findings: Heart size appears mildly enlarged and this is unchanged in comparison to the prior exam.  There has been interval resolution of the pulmonary vascular congestion seen on the prior exam.  No focal infiltrates or signs of congestive failure are seen.  No pleural effusion is noted and a stable degree of mild central  peribronchial cuffing is noted.  Bony structures appear intact.  IMPRESSION: Interval resolution of pulmonary vascular congestion.  Stable cardiac enlargement  Original Report Authenticated By: Bertha Stakes, M.D.   Dg Chest 2 View  05/11/2011  *RADIOLOGY REPORT*  Clinical Data: Shortness of breath and cough.  CHEST - 2 VIEW  Comparison: 02/26/2011.  Findings: The heart is mildly enlarged.  The mediastinal and hilar contours are slightly prominent but unchanged.  There is vascular congestion and possible mild interstitial edema.  No definite effusions or focal infiltrates.  The bony thorax is intact.  IMPRESSION: Cardiac enlargement with vascular congestion and probable mild interstitial edema.  Original Report Authenticated By: P. Loralie Champagne, M.D.   Ct Angio Chest W/cm &/or Wo Cm  06/10/2011  *RADIOLOGY REPORT*  Clinical Data: 46 year old female with chest pain, severe hypertension.  History of cough, wheezing  CT ANGIOGRAPHY CHEST  Technique:  Multidetector CT imaging of the chest using the standard protocol during bolus administration of intravenous contrast. Multiplanar reconstructed images including MIPs were obtained and reviewed to evaluate the vascular anatomy.  Contrast: OMNIPAQUE IOHEXOL 350 MG/ML SOLN  Comparison: Chest radiographs 06/09/2011 and earlier.  Findings: Adequate contrast bolus timing in the pulmonary arterial tree.  Quantum mottle artifact related to large body habitus. No focal filling defect identified in the pulmonary arterial tree to suggest the presence of acute pulmonary embolism.  Major airways are patent.  And mild dependent atelectasis.  Small pneumatocyst in the and posterior medial right lung apex.  No pericardial or pleural effusion.  Negative visualized upper abdominal viscera.  Visualized aorta is unremarkable.  Negative thoracic inlet.  No mediastinal lymphadenopathy.  Multilevel disc degeneration with vacuum disc phenomena. No acute osseous abnormality  identified.  IMPRESSION: 1.  Suboptimal exam, in part due to large habitus, but no evidence of acute pulmonary embolus. 2.  Mild pulmonary atelectasis.  Original Report Authenticated By: Harley Hallmark, M.D.   Dg Chest Port 1 View  06/01/2011  *RADIOLOGY REPORT*  Clinical Data: Chest pain and cough.  PORTABLE CHEST - 1 VIEW  Comparison: Chest x-ray 05/16/2011.  Findings: The heart is mildly enlarged (unchanged).  There is cephalization of the pulmonary vasculature and slight indistinctness of the interstitial markings suggestive of mild pulmonary edema.  No definite pleural effusions.  Mediastinal contours are unremarkable.  IMPRESSION: 1.  Findings, as above, most suggestive of mild congestive heart failure.  Strictly speaking, superimposed multifocal infection such as severe bronchitis or early multilobar bronchopneumonia cannot be entirely excluded, however,  this is not strongly favored.  Clinical correlation is recommended.  Original Report Authenticated By: Florencia Reasons, M.D.   Dg Chest Port 1 View  05/16/2011  *RADIOLOGY REPORT*  Clinical Data: Pulmonary edema  PORTABLE CHEST - 1 VIEW  Comparison: Portable chest x-ray of 05/15/2011  Findings: The lungs are not quite as well aerated.  There is still a degree of pulmonary vascular congestion present.  The heart is within upper limits of normal in size.  IMPRESSION:   Diminished aeration.  Probable mild pulmonary vascular congestion.  Original Report Authenticated By: Juline Patch, M.D.   Dg Chest Port 1 View  05/15/2011  *RADIOLOGY REPORT*  Clinical Data: Short of breath.  Pulmonary edema.  PORTABLE CHEST - 1 VIEW  Comparison: 05/12/2011  Findings: Mild cardiomegaly.  Mild right hemidiaphragm elevation. Mildly degraded exam due to AP portable technique and patient body habitus.  No pleural effusion or pneumothorax.  Low lung volumes with resultant pulmonary interstitial prominence.  There may be mild pulmonary venous congestion.  No overt  congestive failure. No lobar consolidation.  IMPRESSION: Cardiomegaly and low lung volumes.  Possible mild pulmonary venous congestion.  Decreased sensitivity and specificity exam due to technique related factors, as described above.  Original Report Authenticated By: Consuello Bossier, M.D.   Ct Maxillofacial  Ltd Wo Cm  05/15/2011  *RADIOLOGY REPORT*  Clinical Data:  Sinusitis.  CT LIMITED SINUSES WITHOUT CONTRAST  Technique:  Multidetector CT images of the paranasal sinuses were obtained in a single plane without contrast.  Comparison:  None available.  Findings:  The paranasal sinuses and mastoid air cells are clear. The nasal septum is midline.  No obstructing lesions are present. The mastoid air cells are clear.  IMPRESSION: Negative sinuses.  Original Report Authenticated By: Jamesetta Orleans. MATTERN, M.D.     Whitman Hero: 05-12-2011: Left ventricle: The cavity size was normal. Wall thickness was increased in a pattern of moderate LVH. The estimated ejection fraction was 65%. Wall motion was normal; there were no regional wall motion abnormalities. Left ventricular diastolic function parameters were normal. - Mitral valve: Mild regurgitation. - Left atrium: The atrium was mildly dilated.    BRIEF ADMITTING H & P: Pt is fairly reliable historian. States that starting night of 5/2 she  developed left breast, substernal chest pressure that was particularly  severe and prevented her from sleeping, lasted all day 5/3 for which she  called PCP's office and was directed to ED. At times oddly described, but  appears to radiate into her back, and possibly "down" from her right side of  neck into chest with associated right neck numbness. No other associated  symptoms, and there is no clear pattern of exertional worsening or relief  with rest, but she states when she walks there is a "pulling down" sensation  in her chest. No clear SOB with all this, and she seems to have improved  from the recent pulmonary  issues and curerntly on avelox and prednisone.   Hospital Course:  1-Chest pain: Atypical , relates pain on palpation. Patient was admitted to telemetry, cardiac enzymes times 3 negative. CT angio negative for PE and dissection. She is chest pain free. Will give prescription for protonix. I arrange follow up with cardiology. EKG sinus rhythm, no ST elevation. She denies chest pain on exertion.  2-COPD/asthma: Will resume Home regimen, she needs to follow up with Pulmonologist.  3-Lower extremities Edema: continue with dose of lasix.    Disposition and Follow-up:  Discharge  Orders    Future Appointments: Provider: Department: Dept Phone: Center:   06/19/2011 11:30 AM Storm Frisk, MD [facility] [phone] None     Future Orders Please Complete By Expires   Diet - low sodium heart healthy      Increase activity slowly        Follow-up Information    Call Olga Millers, MD. (office will call you with appointment)    Contact information:   [address] [place], [address] [place] [address] [phone]           DISCHARGE EXAM:  Gen: Very obese F . Is pleasant, able to relate history pretty well, not distressed  HEENT: Sclera are very injected, but pupils round, reactive ~19mm, equal,  EOMI. Mouth moist and normal with teeth in place.  Lungs: Poorly heard due to habitus, but grossly normal, again, limited exam  but no gross wheezes crackles rales  Heart: Regular, S1/2 apprecaited, not tachycardic, crescendo/decrescendo  murmur heard at BUSB's, no heaves noted  Abd: Obese with pannus noted, but not tender, tight, or distended, benign  overall  Extrem: Bilateral LE edema.   Neuro: Alert, attentive conversant, CN intact without drooping, slurring, no  aphasia, moves extremiteis on her own, gait normal, grossly non-focal    Blood pressure 130/76, pulse 62, temperature 98.1 F (36.7 C), temperature source Oral, resp. rate 22, height 5\' 4"   (1.626 m), weight 182.8 kg (403 lb), last menstrual period 05/17/2011, SpO2 99.00%.   Basename 06/09/11 1811  NA 138  K 3.8  CL 99  CO2 29  GLUCOSE 101*  BUN 8  CREATININE 0.74  CALCIUM 8.9  MG --  PHOS --    Basename 06/09/11 1811  WBC 10.5  NEUTROABS 7.2  HGB 10.2*  HCT 35.3*  MCV 78.4  PLT 249    Signed: Ajane Novella M.D. 06/10/2011, 8:10 AM

## 2011-06-12 ENCOUNTER — Ambulatory Visit: Payer: 59 | Admitting: Cardiovascular Disease

## 2011-06-19 ENCOUNTER — Ambulatory Visit (INDEPENDENT_AMBULATORY_CARE_PROVIDER_SITE_OTHER): Payer: Self-pay | Admitting: Critical Care Medicine

## 2011-06-19 ENCOUNTER — Encounter: Payer: Self-pay | Admitting: Critical Care Medicine

## 2011-06-19 VITALS — BP 108/82 | HR 94 | Temp 97.7°F | Ht 64.0 in

## 2011-06-19 DIAGNOSIS — J441 Chronic obstructive pulmonary disease with (acute) exacerbation: Secondary | ICD-10-CM

## 2011-06-19 MED ORDER — TIOTROPIUM BROMIDE MONOHYDRATE 18 MCG IN CAPS: 18.0000 ug | ORAL_CAPSULE | Freq: Every day | RESPIRATORY_TRACT | Status: DC

## 2011-06-19 MED ORDER — PREDNISONE 10 MG PO TABS: ORAL_TABLET | ORAL | Status: DC

## 2011-06-19 MED ORDER — AZITHROMYCIN 250 MG PO TABS: 250.0000 mg | ORAL_TABLET | Freq: Every day | ORAL | Status: AC

## 2011-06-19 NOTE — Progress Notes (Signed)
Subjective:    Patient ID: Jenna Chandler, female    DOB: [birth year], 46 y.o.   MRN: [id number]  HPI  46 year old, morbidly, obese, female, with a known history of asthma, and COPD, and a former smoker. Seen for initial pulmonary consult 05/15/2011  06/01/2011 Acute OV   patient was admitted April 4 through 05/18/2011 for acute respiratory distress asthmatic bronchitic exacerbation and uncontrolled hypertension. Pulmonary critical care was consulted and seen patient on 05/15/2011 with recommendations for aggressive pulmonary hygiene, IV steroids, cyclical cough regimen. 2-D echo showed ejection fracture 65% with moderate left ventricular hypertrophy. . CT scan of her sinuses was negative. Patient was discharged on slow prednisone taper and O2 for persistent desaturations . Her ACE inhibitor was stopped. The patient was recommended to begin on budesonide nebulizers twice daily along with Spiriva.  Patient presents today for worsening shortness of breath, cough, congestion, and sleepiness. She was seen in her cardiologist this morning and recommended to be worked into the pulmonary clinic. This afternoon. Planes that over the last week. Her cough has worsened with increased congestion, thick, yellow mucus, shortness of breath, wheezing, dyspnea on exertion. Complains that she falls asleep very easily and has been very lethargic for the last few days. Patient has not begun her budesonide nebulizers due to lack of financial and insurance support. She is seen at [facility] and depends on them for her medications. Patient does report that she was recently told to start CPAP however, she just received her machine and mask today. She denies any hemoptysis, chest pain, abdominal pain, or vomiting. Patient has had increased lower ext.  swelling.  06/19/2011 Pt has been in ED/hosp three times since 4/26 OV   4/26- 4/30 and 5/3 -5/4. CXR and CT chest neg.  Cont abx and medrol. Cont portable oxygen Since d/c has  light cough spell.  Still with yellow mucus on the cough.  FLuid intake was an issue. Now mucus is white. Still coughs up thick mucus. Dyspnea an issue, with exertion.  NOtes some dyspnea at rest.   Notes ant chest pressure, dull ache, no worse with deep breath.  Pain is very transient. Occ radiates to the Left breast.  No belching or burping. No diff swallowing. Uses Cpap plus oxygen at night   Review of Systems  Constitutional:   No  weight loss, night sweats,  Fevers, chills, fatigue, or  lassitude.  HEENT:   No headaches,  Difficulty swallowing,  Tooth/dental problems, or  Sore throat,                No sneezing, itching, ear ache, nasal congestion, post nasal drip,   CV:  No chest pain,  Orthopnea, PND,  anasarca, dizziness, palpitations, syncope.   GI  No heartburn, indigestion, abdominal pain, nausea, vomiting, diarrhea, change in bowel habits, loss of appetite, bloody stools.   Resp:    No coughing up of blood.     No chest wall deformity  Skin: no rash or lesions.  GU: no dysuria, change in color of urine, no urgency or frequency.  No flank pain, no hematuria   MS:  No joint pain or swelling.  No decreased range of motion.  No back pain.  Psych:  No change in mood or affect. No depression or anxiety.  No memory loss.         Objective:   Physical Exam BP 108/82  Pulse 94  Temp(Src) 97.7 F (36.5 C) (Oral)  Ht 5\' 4"  (1.626 m)  SpO2 98%  LMP 05/17/2011  GEN: A/Ox3; NAD, morbidly obese   HEENT:  [place]/AT,  EACs-clear, TMs-wnl, NOSE-clear, THROAT  no lesions, no postnasal drip or exudate noted.   NECK:  Supple w/ fair ROM; no JVD; normal carotid impulses w/o bruits; no thyromegaly or nodules palpated; no lymphadenopathy.  RESP  Coarse BS w/ upper airway  psuedowheezing much less no accessory muscle use, no dullness to percussion  CARD:  RRR, no m/r/g  , 1+ peripheral edema, pulses intact, no cyanosis or clubbing.  GI:   Soft & nt; nml bowel sounds; no  organomegaly or masses detected.  Musco: Warm bil, no deformities or joint swelling noted.   Neuro: alert, no focal deficits noted.    Skin: Warm, no lesions or rashes         Assessment & Plan:   COPD with acute exacerbation Chronic obstructive lung disease with asthmatic bronchitic component exacerbated by reflux disease Now with acute exacerbation Frequent admissions Plan Take prednisone 10mg  Take 4 for three days 3 for three days 2 for three days 1 for three days and stop Take azithromycin 250mg  Take two once then one daily until gone Start Spiriva daily Use nebulizer or albuterol inhaler as needed Return 6 weeks [place] [facility]     Updated Medication List Outpatient Encounter Prescriptions as of 06/19/2011  Medication Sig Dispense Refill  . albuterol (PROVENTIL HFA;VENTOLIN HFA) 108 (90 BASE) MCG/ACT inhaler Inhale 2 puffs into the lungs every 6 (six) hours as needed. wheezing       . albuterol (PROVENTIL) (2.5 MG/3ML) 0.083% nebulizer solution Take 2.5 mg by nebulization every 6 (six) hours as needed.      . ALPRAZolam (XANAX) 1 MG tablet Take 1 mg by mouth as needed.      Marland Kitchen aspirin (ASPIRIN EC) 81 MG EC tablet Take 1 tablet (81 mg total) by mouth daily. Swallow whole.  30 tablet  12  . cloNIDine (CATAPRES) 0.2 MG tablet Take 1 tablet (0.2 mg total) by mouth 2 (two) times daily.  60 tablet  2  . furosemide (LASIX) 40 MG tablet Take 80 mg by mouth 2 (two) times daily.      Marland Kitchen guaiFENesin (MUCINEX) 600 MG 12 hr tablet Take 1 tablet (600 mg total) by mouth 2 (two) times daily as needed for congestion.      . hydrALAZINE (APRESOLINE) 50 MG tablet Take 1 tablet (50 mg total) by mouth every 8 (eight) hours.  90 tablet  0  . metoprolol (LOPRESSOR) 50 MG tablet Take 100 mg by mouth 2 (two) times daily.       . pantoprazole (PROTONIX) 40 MG tablet Take 1 tablet (40 mg total) by mouth daily.  30 tablet  0  . traMADol (ULTRAM) 50 MG tablet Take 50 mg by mouth every 8 (eight)  hours as needed. For pain.      . traZODone (DESYREL) 100 MG tablet Take by mouth at bedtime.      Marland Kitchen azithromycin (ZITHROMAX) 250 MG tablet Take 1 tablet (250 mg total) by mouth daily. Take two once then one daily until gone  6 each  0  . losartan (COZAAR) 100 MG tablet Take 100 mg by mouth daily. Has not picked up Rx yet      . predniSONE (DELTASONE) 10 MG tablet Take 4 for three days 3 for three days 2 for three days 1 for three days and stop  30 tablet  0  . tiotropium (SPIRIVA) 18 MCG  inhalation capsule Place 1 capsule (18 mcg total) into inhaler and inhale daily.  30 capsule  11  . DISCONTD: losartan (COZAAR) 100 MG tablet Take 1 tablet (100 mg total) by mouth daily.  30 tablet  0  . DISCONTD: tiotropium (SPIRIVA) 18 MCG inhalation capsule Place 18 mcg into inhaler and inhale daily.

## 2011-06-19 NOTE — Assessment & Plan Note (Signed)
Chronic obstructive lung disease with asthmatic bronchitic component exacerbated by reflux disease Now with acute exacerbation Frequent admissions Plan Take prednisone 10mg  Take 4 for three days 3 for three days 2 for three days 1 for three days and stop Take azithromycin 250mg  Take two once then one daily until gone Start Spiriva daily Use nebulizer or albuterol inhaler as needed Return 6 weeks [place] [facility]

## 2011-06-19 NOTE — Patient Instructions (Signed)
Take prednisone 10mg  Take 4 for three days 3 for three days 2 for three days 1 for three days and stop Take azithromycin 250mg  Take two once then one daily until gone Start Spiriva daily Use nebulizer or albuterol inhaler as needed Return 6 weeks [place] [facility]

## 2011-06-22 ENCOUNTER — Emergency Department (HOSPITAL_COMMUNITY)
Admission: EM | Admit: 2011-06-22 | Discharge: 2011-06-22 | Disposition: A | Discharge status: Home / Self Care | Payer: Self-pay | Attending: Emergency Medicine | Admitting: Emergency Medicine

## 2011-06-22 ENCOUNTER — Emergency Department (HOSPITAL_COMMUNITY): Payer: Self-pay

## 2011-06-22 ENCOUNTER — Encounter (HOSPITAL_COMMUNITY): Payer: Self-pay

## 2011-06-22 DIAGNOSIS — M25519 Pain in unspecified shoulder: Secondary | ICD-10-CM | POA: Insufficient documentation

## 2011-06-22 DIAGNOSIS — M79609 Pain in unspecified limb: Secondary | ICD-10-CM | POA: Insufficient documentation

## 2011-06-22 DIAGNOSIS — R0602 Shortness of breath: Secondary | ICD-10-CM | POA: Insufficient documentation

## 2011-06-22 DIAGNOSIS — J449 Chronic obstructive pulmonary disease, unspecified: Secondary | ICD-10-CM | POA: Insufficient documentation

## 2011-06-22 DIAGNOSIS — J40 Bronchitis, not specified as acute or chronic: Secondary | ICD-10-CM | POA: Insufficient documentation

## 2011-06-22 DIAGNOSIS — I10 Essential (primary) hypertension: Secondary | ICD-10-CM | POA: Insufficient documentation

## 2011-06-22 DIAGNOSIS — J4489 Other specified chronic obstructive pulmonary disease: Secondary | ICD-10-CM | POA: Insufficient documentation

## 2011-06-22 DIAGNOSIS — R079 Chest pain, unspecified: Secondary | ICD-10-CM | POA: Insufficient documentation

## 2011-06-22 MED ORDER — IPRATROPIUM BROMIDE 0.02 % IN SOLN
0.5000 mg | Freq: Once | RESPIRATORY_TRACT | Status: AC
  Administered 2011-06-22: 0.5 mg via RESPIRATORY_TRACT
  Filled 2011-06-22: qty 2.5

## 2011-06-22 MED ORDER — ALBUTEROL SULFATE (5 MG/ML) 0.5% IN NEBU
10.0000 mg | INHALATION_SOLUTION | Freq: Once | RESPIRATORY_TRACT | Status: DC
  Filled 2011-06-22: qty 0.5

## 2011-06-22 MED ORDER — ALBUTEROL SULFATE (5 MG/ML) 0.5% IN NEBU
2.5000 mg | INHALATION_SOLUTION | Freq: Once | RESPIRATORY_TRACT | Status: AC
  Administered 2011-06-22: 2.5 mg via RESPIRATORY_TRACT

## 2011-06-22 NOTE — ED Notes (Signed)
Pt transported to and from radiology on stretcher with tech and tolerated well. 

## 2011-06-22 NOTE — ED Notes (Signed)
Pt up ambulatory to the bathroom at this time with no difficulty and no distress

## 2011-06-22 NOTE — ED Provider Notes (Signed)
History     CSN: [id number]  Arrival date & time 06/22/11  1191   First MD Initiated Contact with Patient 06/22/11 [phone]      Chief Complaint  Patient presents with  . Chest Pain    (Consider location/radiation/quality/duration/timing/severity/associated sxs/prior treatment) HPI Comments: Jenna Chandler is a 46 y.o. Female who has left shoulder pain that started this morning. Radiates into her left chest and right arm. She used her CPAP during the night and has been taking her albuterol inhaler regularly. She is not using her albuterol nebulizer or her Spiriva that was prescribed recently. She denies fever, chills, nausea or vomiting. She was transferred by EMS. She took aspirin prior to transport.  The history is provided by the patient.    Past Medical History  Diagnosis Date  . Asthma   . Hypertension   . COPD (chronic obstructive pulmonary disease)     +/- asthma   . Obesity   . Manic, depressive   . Enlarged heart   . Fibromyalgia     History reviewed. No pertinent past surgical history.  No family history on file.  History  Substance Use Topics  . Smoking status: Former Smoker -- 2.0 packs/day for 15 years    Types: Cigarettes    Quit date: 05/24/2002  . Smokeless tobacco: Not on file  . Alcohol Use: No    OB History    Grav Para Term Preterm Abortions TAB SAB Ect Mult Living                  Review of Systems  All other systems reviewed and are negative.    Allergies  Review of patient's allergies indicates no known allergies.  Home Medications   Current Outpatient Rx  Name Route Sig Dispense Refill  . ALBUTEROL SULFATE HFA 108 (90 BASE) MCG/ACT IN AERS Inhalation Inhale 2 puffs into the lungs every 6 (six) hours as needed. wheezing     . ALBUTEROL SULFATE (2.5 MG/3ML) 0.083% IN NEBU Nebulization Take 2.5 mg by nebulization every 6 (six) hours as needed.    . ALPRAZOLAM 1 MG PO TABS Oral Take 1 mg by mouth as needed.    . ASPIRIN 81 MG PO TBEC  Oral Take 1 tablet (81 mg total) by mouth daily. Swallow whole. 30 tablet 12  . AZITHROMYCIN 250 MG PO TABS Oral Take 1 tablet (250 mg total) by mouth daily. Take two once then one daily until gone 6 each 0  . CLONIDINE HCL 0.2 MG PO TABS Oral Take 1 tablet (0.2 mg total) by mouth 2 (two) times daily. 60 tablet 2  . FUROSEMIDE 40 MG PO TABS Oral Take 80 mg by mouth 2 (two) times daily.    Marland Kitchen HYDRALAZINE HCL 50 MG PO TABS Oral Take 1 tablet (50 mg total) by mouth every 8 (eight) hours. 90 tablet 0  . LOSARTAN POTASSIUM 100 MG PO TABS Oral Take 100 mg by mouth daily. Has not picked up Rx yet    . METOPROLOL TARTRATE 50 MG PO TABS Oral Take 100 mg by mouth 2 (two) times daily.     Marland Kitchen PANTOPRAZOLE SODIUM 40 MG PO TBEC Oral Take 1 tablet (40 mg total) by mouth daily. 30 tablet 0  . PREDNISONE 10 MG PO TABS  Take 4 for three days 3 for three days 2 for three days 1 for three days and stop 30 tablet 0  . TRAMADOL HCL 50 MG PO TABS Oral Take 50  mg by mouth every 8 (eight) hours as needed. For pain.    . TRAZODONE HCL 100 MG PO TABS Oral Take by mouth at bedtime.    Marland Kitchen TIOTROPIUM BROMIDE MONOHYDRATE 18 MCG IN CAPS Inhalation Place 1 capsule (18 mcg total) into inhaler and inhale daily. 30 capsule 11    BP 110/67  Pulse 71  Temp(Src) 97.7 F (36.5 C) (Oral)  Resp 18  Ht 5\' 4"  (1.626 m)  Wt 400 lb (181.439 kg)  BMI 68.66 kg/m2  SpO2 97%  LMP 05/17/2011  Physical Exam  Nursing note and vitals reviewed. Constitutional: She is oriented to person, place, and time. She appears well-developed and well-nourished.       Morbidly obese  HENT:  Head: Normocephalic and atraumatic.  Eyes: Conjunctivae and EOM are normal. Pupils are equal, round, and reactive to light.  Neck: Normal range of motion and phonation normal. Neck supple.  Cardiovascular: Normal rate, regular rhythm and intact distal pulses.   Pulmonary/Chest: Effort normal and breath sounds normal. No respiratory distress. She has no wheezes. She  has no rales. She exhibits no tenderness.  Abdominal: Soft. She exhibits no distension. There is no tenderness. There is no guarding.  Musculoskeletal: Normal range of motion. She exhibits edema (2+ bilateral legs). She exhibits no tenderness.  Neurological: She is alert and oriented to person, place, and time. She has normal strength. No cranial nerve deficit. She exhibits normal muscle tone. Coordination normal.  Skin: Skin is warm and dry.  Psychiatric: She has a normal mood and affect. Her behavior is normal. Judgment and thought content normal.       COPD with acute exacerbation - Shan Levans, MD 06/19/2011 2:15 PM Signed  Chronic obstructive lung disease with asthmatic bronchitic component exacerbated by reflux disease  Now with acute exacerbation  Frequent admissions  Plan  Take prednisone 10mg  Take 4 for three days 3 for three days 2 for three days 1 for three days and stop  Take azithromycin 250mg  Take two once then one daily until gone  Start Spiriva daily  Use nebulizer or albuterol inhaler as needed  Return 6 weeks [place] office       ED Course  Procedures (including critical care time)   Date: 06/22/2011  Rate: 80  Rhythm: normal sinus rhythm  QRS Axis: normal  Intervals: normal  ST/T Wave abnormalities: normal  Conduction Disutrbances:none  Narrative Interpretation:   Old EKG Reviewed: unchanged  Emergency department treatment: Negative with albuterol and Atrovent  09:36-  Calm comfortable  Labs Reviewed - No data to display Dg Chest 2 View  06/22/2011  *RADIOLOGY REPORT*  Clinical Data: Mid chest pain radiating to the shoulders and jaw, diabetes, former smoking history  CHEST - 2 VIEW  Comparison: Chest x-ray of 06/09/2011 and CT chest of 06/10/2011  Findings: No active infiltrate or effusion is seen.  The heart is mildly enlarged and there may be minimal pulmonary vascular congestion present.  No bony abnormality is seen.  IMPRESSION: No pneumonia.   Mild cardiomegaly with question of mild pulmonary vascular congestion.  Original Report Authenticated By: Juline Patch, M.D.     1. Bronchitis       MDM  Evaluation consistent with bronchitis, with secondary chest discomfort. Doubt ACS, pneumonia, PE, metabolic instability.   Plan: Home Medications- usual; Home Treatments- rest; Recommended follow up- PCP 1 week        Flint Melter, MD 06/22/11 [phone]

## 2011-06-22 NOTE — ED Notes (Signed)
EKG was performed by Maryruth Bun, RN

## 2011-06-22 NOTE — ED Notes (Signed)
Pt presents with onset of L shoulder and arm pain that began while pt was seated on bed at 0630 this morning.  Pt reports pain radiated into her chest and into L scapula, neck face and into R arm.  Pt reports shortness of breath; denies any nausea or diaphoresis.  Pt denies any discomfort at present, except for headache, which began after pain began.  Pt took 650mg  of ASA prior to EMS arrival.

## 2011-06-22 NOTE — ED Notes (Signed)
Pt returned from being out of the department by radiology tech; pt placed back on monitor, continuous pulse oximetry and blood pressure cuff

## 2011-06-22 NOTE — ED Notes (Signed)
Pt reports she is still being treated for pneumonia, was started another abx on Monday, continues to have productive cough with yellow phlegm.

## 2011-06-22 NOTE — Discharge Instructions (Signed)
Use your medications as prescribed. Start the [place] today.   Bronchitis Bronchitis is a problem of the air tubes leading to your lungs. This problem makes it hard for air to get in and out of the lungs. You may cough a lot because your air tubes are narrow. Going without care can cause lasting (chronic) bronchitis. HOME CARE   Drink enough fluids to keep your pee (urine) clear or pale yellow.   Use a cool mist humidifier.   Quit smoking if you smoke. If you keep smoking, the bronchitis might not get better.   Only take medicine as told by your doctor.  GET HELP RIGHT AWAY IF:   Coughing keeps you awake.   You start to wheeze.   You become more sick or weak.   You have a hard time breathing or get short of breath.   You cough up blood.   Coughing lasts more than 2 weeks.   You have a fever.   Your baby is older than 3 months with a rectal temperature of 102 F (38.9 C) or higher.   Your baby is 87 months old or younger with a rectal temperature of 100.4 F (38 C) or higher.  MAKE SURE YOU:  Understand these instructions.   Will watch your condition.   Will get help right away if you are not doing well or get worse.  Document Released: 07/12/2007 Document Revised: 01/12/2011 Document Reviewed: 12/25/2008 [facility] Patient Information 2012 [place], [place].

## 2011-07-10 ENCOUNTER — Telehealth: Payer: Self-pay | Admitting: Internal Medicine

## 2011-07-10 ENCOUNTER — Encounter: Payer: Self-pay | Admitting: Internal Medicine

## 2011-07-10 ENCOUNTER — Ambulatory Visit: Payer: Self-pay | Admitting: Adult Health

## 2011-07-10 ENCOUNTER — Ambulatory Visit (INDEPENDENT_AMBULATORY_CARE_PROVIDER_SITE_OTHER): Payer: Self-pay | Admitting: Internal Medicine

## 2011-07-10 ENCOUNTER — Telehealth: Payer: Self-pay | Admitting: Critical Care Medicine

## 2011-07-10 VITALS — BP 172/98 | HR 75 | Temp 98.3°F | Ht 64.0 in | Wt 397.2 lb

## 2011-07-10 DIAGNOSIS — J449 Chronic obstructive pulmonary disease, unspecified: Secondary | ICD-10-CM

## 2011-07-10 DIAGNOSIS — J441 Chronic obstructive pulmonary disease with (acute) exacerbation: Secondary | ICD-10-CM

## 2011-07-10 MED ORDER — LEVALBUTEROL HCL 0.63 MG/3ML IN NEBU
0.6300 mg | INHALATION_SOLUTION | Freq: Once | RESPIRATORY_TRACT | Status: AC
  Administered 2011-07-10: 0.63 mg via RESPIRATORY_TRACT

## 2011-07-10 MED ORDER — METHYLPREDNISOLONE ACETATE 80 MG/ML IJ SUSP
80.0000 mg | Freq: Once | INTRAMUSCULAR | Status: AC
  Administered 2011-07-10: 80 mg via INTRAMUSCULAR

## 2011-07-10 MED ORDER — PREDNISONE 10 MG PO TABS: ORAL_TABLET | ORAL | Status: DC

## 2011-07-10 MED ORDER — DOXYCYCLINE HYCLATE 100 MG PO TABS: 100.0000 mg | ORAL_TABLET | Freq: Two times a day (BID) | ORAL | Status: AC

## 2011-07-10 NOTE — Telephone Encounter (Signed)
Spoke with pt. She states has had cough x 1 wk-progressively getting worse and also has increased SOB and chest discomfort with inspiration. OV with TP for 2:45 pm today.

## 2011-07-10 NOTE — Progress Notes (Signed)
Subjective:    Patient ID: Jenna Chandler, female    DOB: 1965-03-20, 46 y.o.   MRN: [id number]  HPI   PCP is [facility], Odette Horns, MD Body mass index is 68.18 kg/(m^2).  reports that she quit smoking about 9 years ago. Her smoking use included Cigarettes. She has a 30 pack-year smoking history. She does not have any smokeless tobacco history on file.  46 year old, morbidly, obese, female, with a known history of asthma, and COPD, and a former smoker. Seen for initial pulmonary consult 05/15/2011   06/01/2011 Acute OV  patient was admitted April 4 through 05/18/2011 for acute respiratory distress asthmatic bronchitic exacerbation and uncontrolled hypertension. Pulmonary critical care was consulted and seen patient on 05/15/2011 with recommendations for aggressive pulmonary hygiene, IV steroids, cyclical cough regimen. 2-D echo showed ejection fracture 65% with moderate left ventricular hypertrophy. . CT scan of her sinuses was negative. Patient was discharged on slow prednisone taper and O2 for persistent desaturations . Her ACE inhibitor was stopped. The patient was recommended to begin on budesonide nebulizers twice daily along with Spiriva.  Patient presents today for worsening shortness of breath, cough, congestion, and sleepiness. She was seen in her cardiologist this morning and recommended to be worked into the pulmonary clinic. This afternoon. Planes that over the last week. Her cough has worsened with increased congestion, thick, yellow mucus, shortness of breath, wheezing, dyspnea on exertion. Complains that she falls asleep very easily and has been very lethargic for the last few days. Patient has not begun her budesonide nebulizers due to lack of financial and insurance support. She is seen at [facility] and depends on them for her medications. Patient does report that she was recently told to start CPAP however, she just received her machine and mask today. She denies any hemoptysis, chest pain,  abdominal pain, or vomiting. Patient has had increased lower ext. swelling.   06/19/2011  Pt has been in ED/hosp three times since 4/26 OV 4/26- 4/30 and 5/3 -5/4.  CXR and CT chest neg. Cont abx and medrol. Cont portable oxygen  Since d/c has light cough spell. Still with yellow mucus on the cough. FLuid intake was an issue.  Now mucus is white. Still coughs up thick mucus.  Dyspnea an issue, with exertion. NOtes some dyspnea at rest.  Notes ant chest pressure, dull ache, no worse with deep breath. Pain is very transient.  Occ radiates to the Left breast. No belching or burping. No diff swallowing.  Uses Cpap plus oxygen at night  OV 07/10/2011 - ACUTE VISIT WITH DR Natally Ribera Morbidly obese female. High risk COPD status, Frequent admissions with BMI 68, SLeep apnea, Poor baseline functional status (ECOG 3-4, occ goes to church, sits at home all day)  Reports 1 week of worsening cough, wheeze, sputum that is yellow colored. Rates symptoms as severe. Insidious onset. Progressive. No asssociated fever. Unclear what aggravating and relieving factors are. Albuterol nebs at home not helping. COPD CAT Score is 35 out of total 40 and reflects extreme high symptom burden   CAT COPD Symptom and Quality of Life Score ([employer])  0 (no burden) to 5 (highest burden)  Never Cough -> Cough all the time 5  No phlegm in chest -> Chest is full of phlegm 5  No chest tightness -> Chest feels very tight 4  No dyspnea for 1 flight stairs/hill -> Very dyspneic for 1 flight of stairs 5  No limitations for ADL at home -> Very limited  with ADL at home 5  Confident leaving home -> Not at all confident leaving home 3  Sleep soundly -> Do not sleep soundly because of lung condition 4  Lots of Energy -> No energy at all 4  TOTAL Score (max 40)  35    CT cHEST 06/10/11  - no PE - some atelectasis only   Past Medical History  Diagnosis Date  . Asthma   . Hypertension   . COPD (chronic  obstructive pulmonary disease)     +/- asthma   . Obesity   . Manic, depressive   . Enlarged heart   . Fibromyalgia      No family history on file.   History   Social History  . Marital Status: Single    Spouse Name: N/A    Number of Children: N/A  . Years of Education: N/A   Occupational History  . Not on file.   Social History Main Topics  . Smoking status: Former Smoker -- 2.0 packs/day for 15 years    Types: Cigarettes    Quit date: 05/24/2002  . Smokeless tobacco: Not on file  . Alcohol Use: No  . Drug Use: No  . Sexually Active: Not on file   Other Topics Concern  . Not on file   Social History Narrative  . No narrative on file     No Known Allergies   Outpatient Prescriptions Prior to Visit  Medication Sig Dispense Refill  . albuterol (PROVENTIL HFA;VENTOLIN HFA) 108 (90 BASE) MCG/ACT inhaler Inhale 2 puffs into the lungs every 6 (six) hours as needed. wheezing       . albuterol (PROVENTIL) (2.5 MG/3ML) 0.083% nebulizer solution Take 2.5 mg by nebulization every 6 (six) hours as needed.      . cloNIDine (CATAPRES) 0.2 MG tablet Take 1 tablet (0.2 mg total) by mouth 2 (two) times daily.  60 tablet  2  . furosemide (LASIX) 40 MG tablet Take 80 mg by mouth 2 (two) times daily.      . hydrALAZINE (APRESOLINE) 50 MG tablet Take 1 tablet (50 mg total) by mouth every 8 (eight) hours.  90 tablet  0  . losartan (COZAAR) 100 MG tablet Take 100 mg by mouth daily. Has not picked up Rx yet      . metoprolol (LOPRESSOR) 50 MG tablet Take 100 mg by mouth 2 (two) times daily.       . pantoprazole (PROTONIX) 40 MG tablet Take 1 tablet (40 mg total) by mouth daily.  30 tablet  0  . tiotropium (SPIRIVA) 18 MCG inhalation capsule Place 1 capsule (18 mcg total) into inhaler and inhale daily.  30 capsule  11  . traMADol (ULTRAM) 50 MG tablet Take 50 mg by mouth every 8 (eight) hours as needed. For pain.      . traZODone (DESYREL) 100 MG tablet Take by mouth at bedtime.      Marland Kitchen  aspirin (ASPIRIN EC) 81 MG EC tablet Take 1 tablet (81 mg total) by mouth daily. Swallow whole.  30 tablet  12  . ALPRAZolam (XANAX) 1 MG tablet Take 1 mg by mouth as needed.      . predniSONE (DELTASONE) 10 MG tablet Take 4 for three days 3 for three days 2 for three days 1 for three days and stop  30 tablet  0        Review of Systems     Objective:   Physical Exam  Vitals reviewed. Constitutional:  She is oriented to person, place, and time. She appears well-developed and well-nourished. No distress.       Body mass index is 68.18 kg/(m^2).  Sitting silently and as soon as she saw me went into a VCD like wheeze and cough that lasted 5 minutes  HENT:  Head: Normocephalic and atraumatic.  Right Ear: External ear normal.  Left Ear: External ear normal.  Mouth/Throat: Oropharynx is clear and moist. No oropharyngeal exudate.  Eyes: Conjunctivae and EOM are normal. Pupils are equal, round, and reactive to light. Right eye exhibits no discharge. Left eye exhibits no discharge. No scleral icterus.  Neck: Normal range of motion. Neck supple. No JVD present. No tracheal deviation present. No thyromegaly present.  Cardiovascular: Normal rate, regular rhythm, normal heart sounds and intact distal pulses.  Exam reveals no gallop and no friction rub.   No murmur heard. Pulmonary/Chest: Effort normal. No respiratory distress. She has wheezes. She has no rales. She exhibits no tenderness.       Likely pseudowheeze Has yellow sputum though  Abdominal: Soft. Bowel sounds are normal. She exhibits no distension and no mass. There is no tenderness. There is no rebound and no guarding.  Musculoskeletal: Normal range of motion. She exhibits no edema and no tenderness.  Lymphadenopathy:    She has no cervical adenopathy.  Neurological: She is alert and oriented to person, place, and time. She has normal reflexes. No cranial nerve deficit. She exhibits normal muscle tone. Coordination normal.  Skin:  Skin is warm and dry. No rash noted. She is not diaphoretic. No erythema. No pallor.  Psychiatric: She has a normal mood and affect. Her behavior is normal. Judgment and thought content normal.          Assessment & Plan:

## 2011-07-10 NOTE — Assessment & Plan Note (Signed)
Nurse will give you nebulizer in the office right now Have 80mg  IM depot medrol in office right now Take doxycycline 100mg  po twice daily x 7 days; take after meals and avoid sunlight Please take Take prednisone 40mg  once daily x 3 days, then 30mg  once daily x 3 days, then 20mg  once daily x 3 days, then prednisone 10mg  once daily  x 3 days and stop Continue your regular medications If symptoms are not improving or getting worse, call us REturn in 1 month with full PFT at followup

## 2011-07-10 NOTE — Telephone Encounter (Signed)
She is a [facility] patient. Please arrange fu with PW in 1 month and no need to see me in 1 month. I did not know when I saw her that she was a [facility] patient

## 2011-07-10 NOTE — Patient Instructions (Signed)
Nurse will give you nebulizer in the office right now Have 80mg IM depot medrol in office right now Take doxycycline 100mg po twice daily x 7 days; take after meals and avoid sunlight Please take Take prednisone 40mg once daily x 3 days, then 30mg once daily x 3 days, then 20mg once daily x 3 days, then prednisone 10mg once daily  x 3 days and stop Continue your regular medications If symptoms are not improving or getting worse, call us REturn in 1 month with full PFT at followup      

## 2011-07-11 ENCOUNTER — Telehealth: Payer: Self-pay | Admitting: Internal Medicine

## 2011-07-11 NOTE — Telephone Encounter (Signed)
Spoke with pt to verify the msg. She states can get meds cheaper and [facility] and needs her doxy and pred taper called there. This was done (had to leave on pharmacy VM) and nothing further needed.

## 2011-08-11 ENCOUNTER — Ambulatory Visit: Payer: Self-pay | Admitting: Internal Medicine

## 2011-08-14 ENCOUNTER — Encounter: Payer: Self-pay | Admitting: Internal Medicine

## 2011-08-14 ENCOUNTER — Ambulatory Visit (INDEPENDENT_AMBULATORY_CARE_PROVIDER_SITE_OTHER): Payer: Self-pay | Admitting: Internal Medicine

## 2011-08-14 DIAGNOSIS — J449 Chronic obstructive pulmonary disease, unspecified: Secondary | ICD-10-CM

## 2011-08-14 DIAGNOSIS — R0989 Other specified symptoms and signs involving the circulatory and respiratory systems: Secondary | ICD-10-CM

## 2011-08-14 DIAGNOSIS — R0600 Dyspnea, unspecified: Secondary | ICD-10-CM

## 2011-08-14 DIAGNOSIS — R0609 Other forms of dyspnea: Secondary | ICD-10-CM

## 2011-08-14 LAB — PULMONARY FUNCTION TEST

## 2011-08-14 MED ORDER — BUDESONIDE-FORMOTEROL FUMARATE 80-4.5 MCG/ACT IN AERO: 2.0000 | INHALATION_SPRAY | Freq: Two times a day (BID) | RESPIRATORY_TRACT | Status: DC

## 2011-08-14 NOTE — Progress Notes (Signed)
PFT done today. 

## 2011-08-14 NOTE — Patient Instructions (Addendum)
Lot of your shortness of breath is due to weight We discussed low glycemic diet - start it as advised Goal weight loss is 1# every 2 weeks- 4 weeks Please start symbicort 80/4.5 2 puff twice daily - take sample, script and show technique Follow sleep apnea advice with Dr Mayford Knife REturn in 2 months to report progress

## 2011-08-14 NOTE — Progress Notes (Signed)
Subjective:    Patient ID: Jenna Chandler, female    DOB: Dec 25, 1965, 46 y.o.   MRN: [id number]  HPI PCP is [facility], Odette Horns, MD Body mass index is 68.18 kg/(m^2).  reports that she quit smoking about 9 years ago. Her smoking use included Cigarettes. She has a 30 pack-year smoking history. She does not have any smokeless tobacco history on file.  46 year old, morbidly, obese, female, with a known history of asthma, and COPD, and a former smoker. Seen for initial pulmonary consult 05/15/2011   06/01/2011 Acute OV  patient was admitted April 4 through 05/18/2011 for acute respiratory distress asthmatic bronchitic exacerbation and uncontrolled hypertension. Pulmonary critical care was consulted and seen patient on 05/15/2011 with recommendations for aggressive pulmonary hygiene, IV steroids, cyclical cough regimen. 2-D echo showed ejection fracture 65% with moderate left ventricular hypertrophy. . CT scan of her sinuses was negative. Patient was discharged on slow prednisone taper and O2 for persistent desaturations . Her ACE inhibitor was stopped. The patient was recommended to begin on budesonide nebulizers twice daily along with Spiriva.  Patient presents today for worsening shortness of breath, cough, congestion, and sleepiness. She was seen in her cardiologist this morning and recommended to be worked into the pulmonary clinic. This afternoon. Planes that over the last week. Her cough has worsened with increased congestion, thick, yellow mucus, shortness of breath, wheezing, dyspnea on exertion. Complains that she falls asleep very easily and has been very lethargic for the last few days. Patient has not begun her budesonide nebulizers due to lack of financial and insurance support. She is seen at [facility] and depends on them for her medications. Patient does report that she was recently told to start CPAP however, she just received her machine and mask today. She denies any hemoptysis, chest pain,  abdominal pain, or vomiting. Patient has had increased lower ext. swelling.   06/19/2011  Pt has been in ED/hosp three times since 4/26 OV 4/26- 4/30 and 5/3 -5/4.  CXR and CT chest neg. Cont abx and medrol. Cont portable oxygen  Since d/c has light cough spell. Still with yellow mucus on the cough. FLuid intake was an issue.  Now mucus is white. Still coughs up thick mucus.  Dyspnea an issue, with exertion. NOtes some dyspnea at rest.  Notes ant chest pressure, dull ache, no worse with deep breath. Pain is very transient.  Occ radiates to the Left breast. No belching or burping. No diff swallowing.  Uses Cpap plus oxygen at night  OV 07/10/2011 - ACUTE VISIT WITH DR RAMASWAMY Morbidly obese female. High risk COPD status, Frequent admissions with BMI 68, SLeep apnea, Poor baseline functional status (ECOG 3-4, occ goes to church, sits at home all day)  Reports 1 week of worsening cough, wheeze, sputum that is yellow colored. Rates symptoms as severe. Insidious onset. Progressive. No asssociated fever. Unclear what aggravating and relieving factors are. Albuterol nebs at home not helping. COPD CAT Score is 35 out of total 40 and reflects extreme high symptom burden   CAT COPD Symptom and Quality of Life Score ([employer])  0 (no burden) to 5 (highest burden)  Never Cough -> Cough all the time 5  No phlegm in chest -> Chest is full of phlegm 5  No chest tightness -> Chest feels very tight 4  No dyspnea for 1 flight stairs/hill -> Very dyspneic for 1 flight of stairs 5  No limitations for ADL at home -> Very limited with ADL  at home 5  Confident leaving home -> Not at all confident leaving home 3  Sleep soundly -> Do not sleep soundly because of lung condition 4  Lots of Energy -> No energy at all 4  TOTAL Score (max 40)  35   CT cHEST 06/10/11  - no PE - some atelectasis only   REC Nurse will give you nebulizer in the office right now  Have 80mg  IM depot medrol in office  right now  Take doxycycline 100mg  po twice daily x 7 days; take after meals and avoid sunlight  Please take Take prednisone 40mg  once daily x 3 days, then 30mg  once daily x 3 days, then 20mg  once daily x 3 days, then prednisone 10mg  once daily x 3 days and stop  Continue your regular medications  If symptoms are not improving or getting worse, call us  REturn in 1 month with full PFT at followup    OV 08/14/2011 Followup dyspnea. PFT - Restriciton with low dlco. FVC 1.4L/42%, Fev1 1L/41%, Ratop 76, 11% BD response, Small ariways 29%, TLC 57%, DLCO 13/36%. Restriction is c.w obesity. Discussed diet and she is eating high glyecmic foods constatnly - chocolates, lot of fruit smoothies, red meat and bread and sandwich. Her dies is low in low glycemi foods like vegetables and nuts  Past, Family, Social reviewed: no change since last visit    Current outpatient prescriptions:albuterol (PROVENTIL HFA;VENTOLIN HFA) 108 (90 BASE) MCG/ACT inhaler, Inhale 2 puffs into the lungs every 6 (six) hours as needed. wheezing , Disp: , Rfl: ;  albuterol (PROVENTIL) (2.5 MG/3ML) 0.083% nebulizer solution, Take 2.5 mg by nebulization every 6 (six) hours as needed., Disp: , Rfl: ;  amLODipine (NORVASC) 10 MG tablet, Take 10 mg by mouth daily., Disp: , Rfl:  aspirin 81 MG EC tablet, Take 325 mg by mouth daily. Swallow whole., Disp: , Rfl: ;  cloNIDine (CATAPRES) 0.2 MG tablet, Take 1 tablet (0.2 mg total) by mouth 2 (two) times daily., Disp: 60 tablet, Rfl: 2;  furosemide (LASIX) 40 MG tablet, Take 80 mg by mouth 2 (two) times daily., Disp: , Rfl: ;  hydrALAZINE (APRESOLINE) 50 MG tablet, Take 1 tablet (50 mg total) by mouth every 8 (eight) hours., Disp: 90 tablet, Rfl: 0 losartan (COZAAR) 100 MG tablet, Take 100 mg by mouth daily. Has not picked up Rx yet, Disp: , Rfl: ;  metoprolol (LOPRESSOR) 50 MG tablet, Take 100 mg by mouth 2 (two) times daily. , Disp: , Rfl: ;  pantoprazole (PROTONIX) 40 MG tablet, Take 1 tablet (40  mg total) by mouth daily., Disp: 30 tablet, Rfl: 0;  tiotropium (SPIRIVA) 18 MCG inhalation capsule, Place 1 capsule (18 mcg total) into inhaler and inhale daily., Disp: 30 capsule, Rfl: 11 traMADol (ULTRAM) 50 MG tablet, Take 50 mg by mouth every 8 (eight) hours as needed. For pain., Disp: , Rfl: ;  traZODone (DESYREL) 100 MG tablet, Take by mouth at bedtime., Disp: , Rfl:    Review of Systems  Constitutional: Negative for fever and unexpected weight change.  HENT: Negative for ear pain, nosebleeds, congestion, sore throat, rhinorrhea, sneezing, trouble swallowing, dental problem, postnasal drip and sinus pressure.   Eyes: Negative for redness and itching.  Respiratory: Negative for cough, chest tightness, shortness of breath and wheezing.   Cardiovascular: Negative for palpitations and leg swelling.  Gastrointestinal: Negative for nausea and vomiting.  Genitourinary: Negative for dysuria.  Musculoskeletal: Negative for joint swelling.  Skin: Negative for rash.  Neurological:  Negative for headaches.  Hematological: Does not bruise/bleed easily.  Psychiatric/Behavioral: Negative for dysphoric mood. The patient is not nervous/anxious.        Objective:   Physical Exam Vitals reviewed. Constitutional: She is oriented to person, place, and time. She appears well-developed and well-nourished. No distress.       Body mass index is 68.18 kg/(m^2). HENT: Not having VCD this visit Head: Normocephalic and atraumatic.  Right Ear: External ear normal.  Left Ear: External ear normal.  Mouth/Throat: Oropharynx is clear and moist. No oropharyngeal exudate.  Eyes: Conjunctivae and EOM are normal. Pupils are equal, round, and reactive to light. Right eye exhibits no discharge. Left eye exhibits no discharge. No scleral icterus.  Neck: Normal range of motion. Neck supple. No JVD present. No tracheal deviation present. No thyromegaly present.  Cardiovascular: Normal rate, regular rhythm, normal heart  sounds and intact distal pulses.  Exam reveals no gallop and no friction rub.   No murmur heard. Pulmonary/Chest: Effort normal. No respiratory distress. She has no wheezes this visit (last visi had VCD). She has no rales. She exhibits no tenderness.     Abdominal: Soft. Bowel sounds are normal. She exhibits no distension and no mass. There is no tenderness. There is no rebound and no guarding.  Musculoskeletal: Normal range of motion. She exhibits no edema and no tenderness.  Lymphadenopathy:    She has no cervical adenopathy.  Neurological: She is alert and oriented to person, place, and time. She has normal reflexes. No cranial nerve deficit. She exhibits normal muscle tone. Coordination normal.  Skin: Skin is warm and dry. No rash noted. She is not diaphoretic. No erythema. No pallor.  Psychiatric: She has a normal mood and affect. Her behavior is normal. Judgment and thought content normal.           Assessment & Plan:

## 2011-08-17 ENCOUNTER — Ambulatory Visit: Payer: Self-pay | Admitting: Critical Care Medicine

## 2011-08-18 NOTE — Assessment & Plan Note (Signed)
Mostly due to obesity. Do not know if there is underlying copd/asthma. So, will cover with symbicort

## 2011-08-18 NOTE — Assessment & Plan Note (Signed)
Educated her on her current diet and pointed out all the bad foods which are high glycemic. Educate her on teh right foods which are low glycemic and gave the [facility] lipid diet sheet. I hope she will follow it  > 50% of this 25 min visit spent in face to face counseling (15 min visit converted to 25 min)

## 2011-10-12 ENCOUNTER — Telehealth: Payer: Self-pay | Admitting: Critical Care Medicine

## 2011-10-12 DIAGNOSIS — J441 Chronic obstructive pulmonary disease with (acute) exacerbation: Secondary | ICD-10-CM

## 2011-10-12 NOTE — Telephone Encounter (Signed)
Yes this is fine.

## 2011-10-12 NOTE — Telephone Encounter (Signed)
Called spoke with patient who reported she did not have medicaid when she was discharged from the hosp and therefore was not able to get a portable O2 system > has been using the large cylinders.  Pt now has medicaid and would like to be evaluated for smaller portable O2 system.  Patient last seen by PW 06/2011 (has seen MR x2 since for acute visit and follow up).  She is due for ov w/ PW on 9.19.13 in [facility] office.  Dr Delford Field, may an order be placed to [facility] to evaluate pt for portable O2?  Thanks.

## 2011-10-12 NOTE — Telephone Encounter (Signed)
Order has been sent and pt is aware. Nothing further was needed 

## 2011-10-16 ENCOUNTER — Ambulatory Visit: Payer: Self-pay | Admitting: Internal Medicine

## 2011-10-24 ENCOUNTER — Ambulatory Visit: Payer: Self-pay | Admitting: Internal Medicine

## 2011-10-26 ENCOUNTER — Encounter: Payer: Self-pay | Admitting: Critical Care Medicine

## 2011-10-26 ENCOUNTER — Ambulatory Visit (INDEPENDENT_AMBULATORY_CARE_PROVIDER_SITE_OTHER): Payer: Medicaid Other | Admitting: Critical Care Medicine

## 2011-10-26 VITALS — BP 140/88 | HR 92 | Temp 97.9°F | Ht 64.0 in

## 2011-10-26 DIAGNOSIS — Z23 Encounter for immunization: Secondary | ICD-10-CM

## 2011-10-26 DIAGNOSIS — J441 Chronic obstructive pulmonary disease with (acute) exacerbation: Secondary | ICD-10-CM

## 2011-10-26 MED ORDER — TIOTROPIUM BROMIDE MONOHYDRATE 18 MCG IN CAPS: 18.0000 ug | ORAL_CAPSULE | Freq: Every day | RESPIRATORY_TRACT | Status: DC

## 2011-10-26 MED ORDER — FLUTICASONE-SALMETEROL 250-50 MCG/DOSE IN AEPB: 1.0000 | INHALATION_SPRAY | Freq: Two times a day (BID) | RESPIRATORY_TRACT | Status: DC

## 2011-10-26 MED ORDER — AZITHROMYCIN 250 MG PO TABS: 250.0000 mg | ORAL_TABLET | Freq: Every day | ORAL | Status: DC

## 2011-10-26 NOTE — Assessment & Plan Note (Addendum)
[name] Copd with mild exacerbation CAT 31 Needs DPI/HFA inhaler use clarified Plan Flu vaccine will be given Stop symbicort Start Advair one puff twice daily Start Spiriva daily Azithromycin Take two once then one daily until gone, sent downstairs to pharmacy Continue oxygen, keep sleep study appt. Focus on weight loss again Return 4 months

## 2011-10-26 NOTE — Patient Instructions (Addendum)
Flu vaccine will be given Stop symbicort Start Advair one puff twice daily Start Spiriva daily Azithromycin Take two once then one daily until gone, sent downstairs to pharmacy Continue oxygen, keep sleep study appt. Focus on weight loss again Return 4 months

## 2011-10-26 NOTE — Progress Notes (Signed)
Subjective:    Patient ID: Jenna Chandler, female    DOB: 05-29-1965, 46 y.o.   MRN: [id number]  HPI     46 y.o.   morbidly, obese, female, with a known history of asthma, and COPD, and a former smoker.   OV 08/14/11 Followup dyspnea. PFT - Restriciton with low dlco. FVC 1.4L/42%, Fev1 1L/41%, Ratop 76, 11% BD response, Small ariways 29%, TLC 57%, DLCO 13/36%. Restriction is c.w obesity. Discussed diet and she is eating high glyecmic foods constatnly - chocolates, lot of fruit smoothies, red meat and bread and sandwich. Her dies is low in low glycemi foods like vegetables and nuts  10/26/2011 Pt seen by MR at last OV  Rx symbicort 80. Weight loss Pt on symbicort and ? If has helped some but not different from albuterol and advair.  Weight unchanged at 400#.  PFTs low DLCO and low Fef 25 75 and restricted TLC.    Now no real cough.  Coughing for two weeks yellow brown.  COugh worse at night.  Pt is hoarse. Not much pndrip.  Notes chest pain , no heart burn.  Feet remain edematous.   CAT 31     Past Medical History  Diagnosis Date  . Asthma   . Hypertension   . COPD (chronic obstructive pulmonary disease)     +/- asthma   . Obesity   . Manic, depressive   . Enlarged heart   . Fibromyalgia      No family history on file.   History   Social History  . Marital Status: Single    Spouse Name: N/A    Number of Children: N/A  . Years of Education: N/A   Occupational History  . Not on file.   Social History Main Topics  . Smoking status: Former Smoker -- 2.0 packs/day for 15 years    Types: Cigarettes    Quit date: 05/24/2002  . Smokeless tobacco: Not on file  . Alcohol Use: No  . Drug Use: No  . Sexually Active: Not on file   Other Topics Concern  . Not on file   Social History Narrative  . No narrative on file     No Known Allergies   Outpatient Prescriptions Prior to Visit  Medication Sig Dispense Refill  . albuterol (PROVENTIL HFA;VENTOLIN HFA) 108 (90  BASE) MCG/ACT inhaler Inhale 2 puffs into the lungs every 6 (six) hours as needed. wheezing       . albuterol (PROVENTIL) (2.5 MG/3ML) 0.083% nebulizer solution Take 2.5 mg by nebulization every 6 (six) hours as needed.      Marland Kitchen amLODipine (NORVASC) 10 MG tablet Take 10 mg by mouth daily.      . furosemide (LASIX) 40 MG tablet Take 80 mg by mouth 2 (two) times daily.      Marland Kitchen losartan (COZAAR) 100 MG tablet Take 100 mg by mouth daily.       . metoprolol (LOPRESSOR) 50 MG tablet Take 100 mg by mouth 2 (two) times daily.       . pantoprazole (PROTONIX) 40 MG tablet Take 1 tablet (40 mg total) by mouth daily.  30 tablet  0  . traZODone (DESYREL) 100 MG tablet Take by mouth at bedtime.      . budesonide-formoterol (SYMBICORT) 80-4.5 MCG/ACT inhaler Inhale 2 puffs into the lungs 2 (two) times daily.  1 Inhaler  12  . cloNIDine (CATAPRES) 0.2 MG tablet Take 1 tablet (0.2 mg total) by mouth 2 (two)  times daily.  60 tablet  2  . aspirin 81 MG EC tablet Take 325 mg by mouth daily. Swallow whole.      . hydrALAZINE (APRESOLINE) 50 MG tablet Take 1 tablet (50 mg total) by mouth every 8 (eight) hours.  90 tablet  0  . tiotropium (SPIRIVA) 18 MCG inhalation capsule Place 1 capsule (18 mcg total) into inhaler and inhale daily.  30 capsule  11  . traMADol (ULTRAM) 50 MG tablet Take 50 mg by mouth every 8 (eight) hours as needed. For pain.            Review of Systems  Constitutional: Negative for fever and unexpected weight change.  HENT: Negative for ear pain, nosebleeds, congestion, sore throat, rhinorrhea, sneezing, trouble swallowing, dental problem, postnasal drip and sinus pressure.   Eyes: Negative for redness and itching.  Respiratory: Negative for cough, chest tightness, shortness of breath and wheezing.   Cardiovascular: Negative for palpitations and leg swelling.  Gastrointestinal: Negative for nausea and vomiting.  Genitourinary: Negative for dysuria.  Musculoskeletal: Negative for joint  swelling.  Skin: Negative for rash.  Neurological: Negative for headaches.  Hematological: Does not bruise/bleed easily.  Psychiatric/Behavioral: Negative for dysphoric mood. The patient is not nervous/anxious.        Objective:   Physical Exam  Filed Vitals:   10/26/11 1557  BP: 140/88  Pulse: 92  Temp: 97.9 F (36.6 C)  TempSrc: Oral  Height: 5\' 4"  (1.626 m)  SpO2: 100%    RUE:[id number] obese AAF  in no distress,  normal affect  ENT: No lesions,  mouth clear,  oropharynx clear, no postnasal drip  Neck: No JVD, no TMG, no carotid bruits  Lungs: No use of accessory muscles, no dullness to percussion,distant BS  Cardiovascular: RRR, heart sounds normal, no murmur or gallops, 3+  peripheral edema  Abdomen: soft and NT, no HSM,  BS normal  Musculoskeletal: No deformities, no cyanosis or clubbing  Neuro: alert, non focal  Skin: Warm, no lesions or rashes       Assessment & Plan:   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 Focus on weight loss again Return 4 months    Updated Medication List Outpatient Encounter Prescriptions as of 10/26/2011  Medication Sig Dispense Refill  . albuterol (PROVENTIL HFA;VENTOLIN HFA) 108 (90 BASE) MCG/ACT inhaler Inhale 2 puffs into the lungs every 6 (six) hours as needed. wheezing       . albuterol (PROVENTIL) (2.5 MG/3ML) 0.083% nebulizer solution Take 2.5 mg by nebulization every 6 (six) hours as needed.      . ALPRAZolam (XANAX) 1 MG tablet Take 1 mg by mouth daily.      Marland Kitchen amLODipine (NORVASC) 10 MG tablet Take 10 mg by mouth daily.      Marland Kitchen aspirin 325 MG tablet Take 325 mg by mouth daily.      . cloNIDine (CATAPRES) 0.2 MG  tablet Take 0.2 mg by mouth 3 (three) times daily.      . Fluticasone-Salmeterol (ADVAIR) 250-50 MCG/DOSE AEPB Inhale 1 puff into the lungs 2 (two) times daily.  60 each  6  . furosemide (LASIX) 40 MG tablet Take 80 mg by mouth 2 (two) times  daily.      . hydrALAZINE (APRESOLINE) 100 MG tablet Take 100 mg by mouth 2 (two) times daily.      Marland Kitchen HYDROcodone-acetaminophen (VICODIN) 5-500 MG per tablet Take 1 tablet by mouth every 6 (six) hours as needed.      Marland Kitchen losartan (COZAAR) 100 MG tablet Take 100 mg by mouth daily.       . metoprolol (LOPRESSOR) 50 MG tablet Take 100 mg by mouth 2 (two) times daily.       . pantoprazole (PROTONIX) 40 MG tablet Take 1 tablet (40 mg total) by mouth daily.  30 tablet  0  . traZODone (DESYREL) 100 MG tablet Take by mouth at bedtime.      . Vitamin D, Ergocalciferol, (DRISDOL) 50000 UNITS CAPS Take 50,000 Units by mouth every 7 (seven) days.      Marland Kitchen DISCONTD: budesonide-formoterol (SYMBICORT) 80-4.5 MCG/ACT inhaler Inhale 2 puffs into the lungs 2 (two) times daily.  1 Inhaler  12  . DISCONTD: cloNIDine (CATAPRES) 0.2 MG tablet Take 1 tablet (0.2 mg total) by mouth 2 (two) times daily.  60 tablet  2  . DISCONTD: Fluticasone-Salmeterol (ADVAIR) 250-50 MCG/DOSE AEPB Inhale 1 puff into the lungs daily. DOES NOT KNOW STRENGTH      . azithromycin (ZITHROMAX) 250 MG tablet Take 1 tablet (250 mg total) by mouth daily. Take two once then one daily until gone  6 each  0  . tiotropium (SPIRIVA) 18 MCG inhalation capsule Place 1 capsule (18 mcg total) into inhaler and inhale daily. Every 2-3 days if needed  30 capsule  6  . DISCONTD: aspirin 81 MG EC tablet Take 325 mg by mouth daily. Swallow whole.      Marland Kitchen DISCONTD: hydrALAZINE (APRESOLINE) 50 MG tablet Take 1 tablet (50 mg total) by mouth every 8 (eight) hours.  90 tablet  0  . DISCONTD: tiotropium (SPIRIVA) 18 MCG inhalation capsule Place 1 capsule (18 mcg total) into inhaler and inhale daily.  30 capsule  11  . DISCONTD:  tiotropium (SPIRIVA) 18 MCG inhalation capsule Place 18 mcg into inhaler and inhale. Every 2-3 days if needed      . DISCONTD: traMADol (ULTRAM) 50 MG tablet Take 50 mg by mouth every 8 (eight) hours as needed. For pain.

## 2011-10-31 ENCOUNTER — Ambulatory Visit: Payer: Self-pay | Admitting: Critical Care Medicine

## 2011-11-22 ENCOUNTER — Telehealth: Payer: Self-pay | Admitting: Critical Care Medicine

## 2011-11-22 MED ORDER — DOXYCYCLINE HYCLATE 100 MG PO TABS: 100.0000 mg | ORAL_TABLET | Freq: Two times a day (BID) | ORAL | Status: DC

## 2011-11-22 NOTE — Telephone Encounter (Signed)
I spoke with pt and is aware of PW recs. I have sent RX into the pharmacy. Pt scheduled OV w/ TP on 11/27/11 at 10:45. Nothing further was needed

## 2011-11-22 NOTE — Telephone Encounter (Signed)
Call in doxycycline 100mg  bid x 7days Will need OV with me or TP to regroup soon

## 2011-11-22 NOTE — Telephone Encounter (Signed)
PT c/o prod cough (thick, yellow), SOB about the same, wheezing.  Denies fever.  Pt is requesting rx for Doxycycline.   Please advise Last ov: 10-26-11    Next ov:  None  Scheduled No Known Allergies

## 2011-11-27 ENCOUNTER — Ambulatory Visit: Payer: Medicaid Other | Admitting: Adult Health

## 2011-12-01 ENCOUNTER — Encounter (HOSPITAL_BASED_OUTPATIENT_CLINIC_OR_DEPARTMENT_OTHER): Payer: Medicaid Other

## 2011-12-05 ENCOUNTER — Encounter (HOSPITAL_COMMUNITY): Payer: Self-pay | Admitting: Emergency Medicine

## 2011-12-05 ENCOUNTER — Emergency Department (HOSPITAL_COMMUNITY): Payer: Medicaid Other

## 2011-12-05 ENCOUNTER — Emergency Department (HOSPITAL_COMMUNITY)
Admission: EM | Admit: 2011-12-05 | Discharge: 2011-12-05 | Disposition: A | Discharge status: Home / Self Care | Payer: Medicaid Other | Attending: Emergency Medicine | Admitting: Emergency Medicine

## 2011-12-05 DIAGNOSIS — IMO0001 Reserved for inherently not codable concepts without codable children: Secondary | ICD-10-CM | POA: Insufficient documentation

## 2011-12-05 DIAGNOSIS — Z87891 Personal history of nicotine dependence: Secondary | ICD-10-CM | POA: Insufficient documentation

## 2011-12-05 DIAGNOSIS — I517 Cardiomegaly: Secondary | ICD-10-CM | POA: Insufficient documentation

## 2011-12-05 DIAGNOSIS — R109 Unspecified abdominal pain: Secondary | ICD-10-CM | POA: Insufficient documentation

## 2011-12-05 DIAGNOSIS — F319 Bipolar disorder, unspecified: Secondary | ICD-10-CM | POA: Insufficient documentation

## 2011-12-05 DIAGNOSIS — E669 Obesity, unspecified: Secondary | ICD-10-CM | POA: Insufficient documentation

## 2011-12-05 DIAGNOSIS — J449 Chronic obstructive pulmonary disease, unspecified: Secondary | ICD-10-CM | POA: Insufficient documentation

## 2011-12-05 DIAGNOSIS — I10 Essential (primary) hypertension: Secondary | ICD-10-CM | POA: Insufficient documentation

## 2011-12-05 DIAGNOSIS — Z79899 Other long term (current) drug therapy: Secondary | ICD-10-CM | POA: Insufficient documentation

## 2011-12-05 DIAGNOSIS — J4489 Other specified chronic obstructive pulmonary disease: Secondary | ICD-10-CM | POA: Insufficient documentation

## 2011-12-05 DIAGNOSIS — Z7982 Long term (current) use of aspirin: Secondary | ICD-10-CM | POA: Insufficient documentation

## 2011-12-05 DIAGNOSIS — H9390 Unspecified disorder of ear, unspecified ear: Secondary | ICD-10-CM | POA: Insufficient documentation

## 2011-12-05 DIAGNOSIS — M62838 Other muscle spasm: Secondary | ICD-10-CM | POA: Insufficient documentation

## 2011-12-05 LAB — URINALYSIS, ROUTINE W REFLEX MICROSCOPIC
Bilirubin Urine: NEGATIVE
Glucose, UA: NEGATIVE mg/dL
Hgb urine dipstick: NEGATIVE
Ketones, ur: NEGATIVE mg/dL
Protein, ur: NEGATIVE mg/dL

## 2011-12-05 MED ORDER — CYCLOBENZAPRINE HCL 10 MG PO TABS: 10.0000 mg | ORAL_TABLET | Freq: Three times a day (TID) | ORAL | Status: DC | PRN

## 2011-12-05 NOTE — ED Notes (Signed)
Per EMS-Pt c/o of headache radiating down neck that start about 2 days ago states that he also has some congestion and right ear pain. Hx of hypertension and MI. Pain 10/10

## 2011-12-05 NOTE — ED Notes (Signed)
[id number]<JY> Expected date:12/05/11<BR> Expected time:12:34 PM<BR> Means of arrival:Ambulance<BR> Comments:<BR> Back pain

## 2011-12-05 NOTE — ED Notes (Signed)
Patient transported to CT 

## 2011-12-06 NOTE — ED Provider Notes (Signed)
History     CSN: [id number]  Arrival date & time 12/05/11  1232   First MD Initiated Contact with Patient 12/05/11 1400      Chief Complaint  Patient presents with  . Migraine  . Abdominal Pain  . Ear Fullness    (Consider location/radiation/quality/duration/timing/severity/associated sxs/prior treatment) Patient is a 46 y.o. female presenting with migraines, abdominal pain, and plugged ear sensation. The history is provided by the patient (pt complains of headache). No language interpreter was used.  Migraine This is a new problem. The current episode started 12 to 24 hours ago. The problem occurs hourly. The problem has not changed since onset.Associated symptoms include abdominal pain. Pertinent negatives include no chest pain and no headaches. Nothing aggravates the symptoms. Nothing relieves the symptoms. She has tried nothing for the symptoms. The treatment provided no relief.  Abdominal Pain The primary symptoms of the illness include abdominal pain. The primary symptoms of the illness do not include fatigue or diarrhea.  Symptoms associated with the illness do not include hematuria, frequency or back pain.  Ear Fullness Associated symptoms include abdominal pain. Pertinent negatives include no chest pain and no headaches.    Past Medical History  Diagnosis Date  . Asthma   . Hypertension   . COPD (chronic obstructive pulmonary disease)     +/- asthma   . Obesity   . Manic, depressive   . Enlarged heart   . Fibromyalgia     History reviewed. No pertinent past surgical history.  No family history on file.  History  Substance Use Topics  . Smoking status: Former Smoker -- 2.0 packs/day for 15 years    Types: Cigarettes    Quit date: 05/24/2002  . Smokeless tobacco: Not on file  . Alcohol Use: No    OB History    Grav Para Term Preterm Abortions TAB SAB Ect Mult Living                  Review of Systems  Constitutional: Negative for fatigue.  HENT:  Negative for congestion, sinus pressure and ear discharge.   Eyes: Negative for discharge.  Respiratory: Negative for cough.   Cardiovascular: Negative for chest pain.  Gastrointestinal: Positive for abdominal pain. Negative for diarrhea.  Genitourinary: Negative for frequency and hematuria.  Musculoskeletal: Negative for back pain.  Skin: Negative for rash.  Neurological: Negative for seizures and headaches.  Hematological: Negative.   Psychiatric/Behavioral: Negative for hallucinations.    Allergies  Review of patient's allergies indicates no known allergies.  Home Medications   Current Outpatient Rx  Name Route Sig Dispense Refill  . ALBUTEROL SULFATE HFA 108 (90 BASE) MCG/ACT IN AERS Inhalation Inhale 2 puffs into the lungs every 6 (six) hours as needed. wheezing     . ALBUTEROL SULFATE (2.5 MG/3ML) 0.083% IN NEBU Nebulization Take 2.5 mg by nebulization every 6 (six) hours as needed.    . ALPRAZOLAM 1 MG PO TABS Oral Take 1 mg by mouth daily.    Marland Kitchen AMLODIPINE BESYLATE 10 MG PO TABS Oral Take 10 mg by mouth daily.    . ASPIRIN 325 MG PO TABS Oral Take 325 mg by mouth daily.    Marland Kitchen CLONIDINE HCL 0.2 MG PO TABS Oral Take 0.2 mg by mouth 3 (three) times daily.    Marland Kitchen FLUTICASONE-SALMETEROL 250-50 MCG/DOSE IN AEPB Inhalation Inhale 1 puff into the lungs 2 (two) times daily. 60 each 6  . FUROSEMIDE 40 MG PO TABS Oral Take 80 mg  by mouth 2 (two) times daily.    Marland Kitchen HYDRALAZINE HCL 100 MG PO TABS Oral Take 100 mg by mouth 3 (three) times daily.     Marland Kitchen HYDROCODONE-ACETAMINOPHEN 5-500 MG PO TABS Oral Take 1 tablet by mouth every 6 (six) hours as needed.    Marland Kitchen LOSARTAN POTASSIUM 100 MG PO TABS Oral Take 100 mg by mouth daily.     Marland Kitchen METOPROLOL TARTRATE 50 MG PO TABS Oral Take 100 mg by mouth 2 (two) times daily.     Marland Kitchen PANTOPRAZOLE SODIUM 40 MG PO TBEC Oral Take 1 tablet (40 mg total) by mouth daily. 30 tablet 0  . POTASSIUM CHLORIDE ER 10 MEQ PO TBCR Oral Take 10 mEq by mouth daily.    Marland Kitchen TIOTROPIUM  BROMIDE MONOHYDRATE 18 MCG IN CAPS Inhalation Place 1 capsule (18 mcg total) into inhaler and inhale daily. Every 2-3 days if needed 30 capsule 6  . TRAZODONE HCL 100 MG PO TABS Oral Take by mouth at bedtime.    Marland Kitchen VITAMIN D (ERGOCALCIFEROL) 50000 UNITS PO CAPS Oral Take 50,000 Units by mouth every 7 (seven) days.    . AZITHROMYCIN 250 MG PO TABS Oral Take 1 tablet (250 mg total) by mouth daily. Take two once then one daily until gone 6 each 0  . CYCLOBENZAPRINE HCL 10 MG PO TABS Oral Take 1 tablet (10 mg total) by mouth 3 (three) times daily as needed for muscle spasms. 20 tablet 0  . DOXYCYCLINE HYCLATE 100 MG PO TABS Oral Take 100 mg by mouth 2 (two) times daily. Pt completed on 11-29-11      BP 145/81  Pulse 60  Temp 97.8 F (36.6 C) (Oral)  Resp 16  SpO2 98%  LMP 11/08/2011  Physical Exam  Constitutional: She is oriented to person, place, and time. She appears well-developed.  HENT:  Head: Normocephalic and atraumatic.  Eyes: Conjunctivae normal and EOM are normal. No scleral icterus.  Neck: Neck supple. No thyromegaly present.  Cardiovascular: Normal rate and regular rhythm.  Exam reveals no gallop and no friction rub.   No murmur heard. Pulmonary/Chest: No stridor. She has no wheezes. She has no rales. She exhibits no tenderness.  Abdominal: She exhibits no distension. There is no tenderness. There is no rebound.  Musculoskeletal: Normal range of motion. She exhibits no edema.  Lymphadenopathy:    She has no cervical adenopathy.  Neurological: She is oriented to person, place, and time. Coordination normal.  Skin: No rash noted. No erythema.  Psychiatric: She has a normal mood and affect. Her behavior is normal.    ED Course  Procedures (including critical care time)   Labs Reviewed  URINALYSIS, ROUTINE W REFLEX MICROSCOPIC  LAB REPORT - SCANNED   Ct Head Wo Contrast  12/05/2011  *RADIOLOGY REPORT*  Clinical Data: Headache.  Ear fullness.  CT HEAD WITHOUT CONTRAST   Technique:  Contiguous axial images were obtained from the base of the skull through the vertex without contrast.  Comparison: No priors.  Findings: No acute intracranial abnormalities.  Specifically, no signs of acute intracerebral hemorrhage, no vascular territory acute/subacute cerebral ischemia, no focal mass, mass effect, hydrocephalus or abnormal intra or extra-axial fluid collections. Visualized paranasal sinuses and mastoids are well pneumatized.  No acute displaced skull fractures are identified.  IMPRESSION: 1.  No acute intracranial abnormalities. 2.  The appearance of the brain is normal.   Original Report Authenticated By: Florencia Reasons, M.D.      1. Muscle spasm  MDM          Benny Lennert, MD 12/06/11 [phone]

## 2012-01-01 ENCOUNTER — Other Ambulatory Visit: Payer: Self-pay | Admitting: Internal Medicine

## 2012-01-01 DIAGNOSIS — K3189 Other diseases of stomach and duodenum: Secondary | ICD-10-CM

## 2012-01-08 ENCOUNTER — Other Ambulatory Visit: Payer: Self-pay | Admitting: Internal Medicine

## 2012-01-08 ENCOUNTER — Other Ambulatory Visit: Payer: Self-pay | Admitting: Family Medicine

## 2012-01-08 ENCOUNTER — Other Ambulatory Visit: Payer: Self-pay | Admitting: Pediatrics

## 2012-01-08 DIAGNOSIS — K3189 Other diseases of stomach and duodenum: Secondary | ICD-10-CM

## 2012-01-08 DIAGNOSIS — R1013 Epigastric pain: Secondary | ICD-10-CM

## 2012-01-11 ENCOUNTER — Other Ambulatory Visit: Payer: Medicaid Other

## 2012-01-18 ENCOUNTER — Encounter (HOSPITAL_BASED_OUTPATIENT_CLINIC_OR_DEPARTMENT_OTHER): Payer: Medicaid Other

## 2012-01-23 ENCOUNTER — Ambulatory Visit
Admission: RE | Admit: 2012-01-23 | Discharge: 2012-01-23 | Disposition: A | Discharge status: Home / Self Care | Payer: Medicaid Other | Source: Ambulatory Visit | Attending: Internal Medicine | Admitting: Internal Medicine

## 2012-01-23 DIAGNOSIS — K3189 Other diseases of stomach and duodenum: Secondary | ICD-10-CM

## 2012-02-13 ENCOUNTER — Encounter (HOSPITAL_BASED_OUTPATIENT_CLINIC_OR_DEPARTMENT_OTHER): Payer: Medicaid Other

## 2012-02-29 ENCOUNTER — Ambulatory Visit: Payer: Medicaid Other | Admitting: Critical Care Medicine

## 2012-03-08 ENCOUNTER — Ambulatory Visit (HOSPITAL_BASED_OUTPATIENT_CLINIC_OR_DEPARTMENT_OTHER): Payer: Medicaid Other | Attending: Cardiology | Admitting: Radiology

## 2012-03-08 VITALS — Ht 64.0 in | Wt >= 6400 oz

## 2012-03-08 DIAGNOSIS — G4733 Obstructive sleep apnea (adult) (pediatric): Secondary | ICD-10-CM

## 2012-03-09 DIAGNOSIS — G4733 Obstructive sleep apnea (adult) (pediatric): Secondary | ICD-10-CM

## 2012-03-09 DIAGNOSIS — R0609 Other forms of dyspnea: Secondary | ICD-10-CM

## 2012-03-09 DIAGNOSIS — R0989 Other specified symptoms and signs involving the circulatory and respiratory systems: Secondary | ICD-10-CM

## 2012-03-10 NOTE — Procedures (Signed)
NAMESONIA, BROMELL               ACCOUNT NO.:  [id number]  MEDICAL RECORD NO.:  [id number]          PATIENT TYPE:  OUT  LOCATION:  SLEEP CENTER                 FACILITY:  [facility]  PHYSICIAN:  Alyxandria Wentz D. Maple Hudson, MD, FCCP, FACPDATE OF BIRTH:  Apr 23, 1965  DATE OF STUDY:  03/08/2012                           NOCTURNAL POLYSOMNOGRAM  REFERRING PHYSICIAN:  Armanda Magic, M.D.  INDICATION FOR STUDY:  Hypersomnia with sleep apnea.  EPWORTH SLEEPINESS SCORE:  18/24.  BMI 69, weight 400 pounds, height 64 inches, neck 16 inches.  MEDICATIONS:  Home medications are charted and reviewed.  SLEEP ARCHITECTURE:  Total sleep time 311 minutes with sleep efficiency 77.6%.  Stage I was 7.1%, stage II 72%, stage III absent, REM 20.9% of total sleep time.  Sleep latency 13.5 minutes, REM latency 94 minutes. Awake after sleep onset 76 minutes.  Arousal index 7.3.  Bedtime Medications:  Vicodin, Xanax, Zithromax.  RESPIRATORY DATA:  Apnea-hypopnea index (AHI) 9.6 per hour.  A total of 50 events was scored including 15 obstructive apneas and 35 hypopneas. Events were not positional.  REM AHI 37.8 per hour.  There were insufficient early events to meet protocol requirements for split protocol CPAP titration.  OXYGEN DATA:  Moderately loud snoring.  The study was performed with the patient wearing oxygen at 2 L/minute by nasal prongs per sleep center protocol.  Oxygen desaturation to a nadir of 81% and mean oxygen saturation through the study of 96.8% on 2 L prongs.  CARDIAC DATA:  Sinus rhythm.  MOVEMENT/PARASOMNIA:  No significant movement disturbance. Bathroom x1.  IMPRESSION/RECOMMENDATION: 1. Mild obstructive sleep apnea/hypopnea syndrome, AHI 9.6 per hour     with non-positional events.  Moderately loud snoring with oxygen     desaturation (on oxygen 2 L) to 81% and mean oxygen saturation     through the study of 96.8%. 2. There were insufficient respiratory events to meet protocol  requirements for split CPAP titration.  The patient can return for     dedicated CPAP titration study if appropriate.     Daleyza Gadomski D. Maple Hudson, MD, [facility], FACP Diplomate, American Board of Sleep Medicine    CDY/MEDQ  D:  03/09/2012 12:16:18  T:  03/10/2012 01:19:16  Job:  [id number]

## 2012-04-10 ENCOUNTER — Ambulatory Visit (HOSPITAL_BASED_OUTPATIENT_CLINIC_OR_DEPARTMENT_OTHER): Payer: Medicaid Other | Attending: Cardiology

## 2012-04-10 VITALS — Ht 64.0 in | Wt >= 6400 oz

## 2012-04-10 DIAGNOSIS — Z9989 Dependence on other enabling machines and devices: Secondary | ICD-10-CM

## 2012-04-10 DIAGNOSIS — G4730 Sleep apnea, unspecified: Secondary | ICD-10-CM | POA: Insufficient documentation

## 2012-04-10 DIAGNOSIS — G4710 Hypersomnia, unspecified: Secondary | ICD-10-CM | POA: Insufficient documentation

## 2012-04-13 DIAGNOSIS — G4730 Sleep apnea, unspecified: Secondary | ICD-10-CM

## 2012-04-13 DIAGNOSIS — G4710 Hypersomnia, unspecified: Secondary | ICD-10-CM

## 2012-04-13 NOTE — Procedures (Signed)
NAME:  Jenna Chandler, Jenna Chandler NO.:  [id number]  MEDICAL RECORD NO.:  [id number]          PATIENT TYPE:  OUT  LOCATION:  SLEEP CENTER                 FACILITY:  [facility]  PHYSICIAN:  Clinton D. Maple Hudson, MD, FCCP, FACPDATE OF BIRTH:  [birth year]May-1965  DATE OF STUDY:  04/10/2012                           NOCTURNAL POLYSOMNOGRAM  REFERRING PHYSICIAN:  Armanda Magic, M.D.  INDICATION FOR STUDY:  Hypersomnia with sleep apnea.  EPWORTH SLEEPINESS SCORE:  18/24, BMI 68.7, weight 400 pounds, height 64 inches, neck 16 inches.  MEDICATIONS:  Home medications are charted and reviewed.  A baseline diagnostic NPSG on March 08, 2012 recorded AHI 9.6 per hour.  Body weight was 400 pounds.  CPAP titration is now requested.  SLEEP ARCHITECTURE:  Total sleep time 360 minutes with sleep efficiency 96.3%.  Stage I was 8.9%, stage II 77.1%, stage III absent, REM 14% of total sleep time.  Sleep latency 3.5 minutes, REM latency 76.5 minutes, awake after sleep onset 10.5 minutes, arousal index 5.5.  Bedtime medication:  None.  RESPIRATORY DATA:  CPAP titration protocol.  CPAP was titrated to 12 CWP, AHI 3.4 per hour.  She wore a medium ResMed Quattro Mirage full face mask with heated humidifier and an EPR of 3.  OXYGEN DATA:  Snoring was prevented at final CPAP and mean oxygen held 94.7% on room air.  CARDIAC DATA:  Sinus rhythm.  MOVEMENT-PARASOMNIA:  A total of 22 limb jerks were counted of which only 1 was associated with arousals or awakening.  No bathroom trips.  IMPRESSIONS-RECOMMENDATIONS: 1. Successful CPAP titration to 12 CWP, AHI 3.4 per hour.  She wore a     medium ResMed Quattro Mirage full face mask with heated humidifier     and an EPR of 3.  Snoring was prevented and mean oxygen saturation     held 94.7% on room air. 2. Baseline diagnostic NPSG on March 08, 2012 had recorded AHI 9.6     per hour.  Body weight was 400 pounds at that study.     Clinton D. Maple Hudson, MD, [facility],  FACP Diplomate, American Board of Sleep Medicine    CDY/MEDQ  D:  04/13/2012 09:41:47  T:  04/13/2012 23:20:37  Job:  [id number]

## 2012-04-15 ENCOUNTER — Ambulatory Visit: Payer: Medicaid Other | Admitting: Critical Care Medicine

## 2012-04-25 ENCOUNTER — Ambulatory Visit (INDEPENDENT_AMBULATORY_CARE_PROVIDER_SITE_OTHER): Payer: Medicaid Other | Admitting: Critical Care Medicine

## 2012-04-25 ENCOUNTER — Encounter: Payer: Self-pay | Admitting: Critical Care Medicine

## 2012-04-25 VITALS — BP 130/86 | HR 66 | Temp 97.9°F | Ht 64.0 in

## 2012-04-25 DIAGNOSIS — J449 Chronic obstructive pulmonary disease, unspecified: Secondary | ICD-10-CM

## 2012-04-25 DIAGNOSIS — J9610 Chronic respiratory failure, unspecified whether with hypoxia or hypercapnia: Secondary | ICD-10-CM

## 2012-04-25 MED ORDER — PREDNISONE 10 MG PO TABS: ORAL_TABLET | ORAL | Status: DC

## 2012-04-25 MED ORDER — AZITHROMYCIN 250 MG PO TABS: ORAL_TABLET | ORAL | Status: DC

## 2012-04-25 NOTE — Patient Instructions (Addendum)
Prednisone 10mg  Take 4 for three days 3 for three days 2 for three days 1 for three days and stop Azithromycin 250mg  Take two once then one daily until gone No other medication changes Return 4 months

## 2012-04-25 NOTE — Progress Notes (Signed)
Subjective:    Patient ID: Jenna Chandler, female    DOB: Oct 12, 1965, 47 y.o.   MRN: [id number]  HPI     47 y.o.   morbidly, obese, female, with a known history of asthma, and COPD, and a former smoker.   OV 08/14/11 Followup dyspnea. PFT - Restriciton with low dlco. FVC 1.4L/42%, Fev1 1L/41%, Ratop 76, 11% BD response, Small ariways 29%, TLC 57%, DLCO 13/36%. Restriction is c.w obesity. Discussed diet and she is eating high glyecmic foods constatnly - chocolates, lot of fruit smoothies, red meat and bread and sandwich. Her dies is low in low glycemi foods like vegetables and nuts  10/26/2011 Pt seen by MR at last OV  Rx symbicort 80. Weight loss Pt on symbicort and ? If has helped some but not different from albuterol and advair.  Weight unchanged at 400#.  PFTs low DLCO and low Fef 25 75 and restricted TLC.    Now no real cough.  Coughing for two weeks yellow brown.  COugh worse at night.  Pt is hoarse. Not much pndrip.  Notes chest pain , no heart burn.  Feet remain edematous.   CAT 31   04/25/2012 Not seen since 10/2011. Now more dark mucus. Pt will awaken and thick mucus comes out. Notes some wheeze.  Notes some pndrip.   No heartburn, ppi helps. Notes some wheeze.  Has old cpap, getting a new cpap machine via dr Mayford Knife    Past Medical History  Diagnosis Date  . Asthma   . Hypertension   . COPD (chronic obstructive pulmonary disease)     +/- asthma   . Obesity   . Manic, depressive   . Enlarged heart   . Fibromyalgia      No family history on file.   History   Social History  . Marital Status: Single    Spouse Name: N/A    Number of Children: N/A  . Years of Education: N/A   Occupational History  . Not on file.   Social History Main Topics  . Smoking status: Former Smoker -- 2.00 packs/day for 15 years    Types: Cigarettes    Quit date: 05/24/2002  . Smokeless tobacco: Not on file  . Alcohol Use: No  . Drug Use: No  . Sexually Active: Not on file   Other  Topics Concern  . Not on file   Social History Narrative  . No narrative on file     No Known Allergies   Outpatient Prescriptions Prior to Visit  Medication Sig Dispense Refill  . albuterol (PROVENTIL HFA;VENTOLIN HFA) 108 (90 BASE) MCG/ACT inhaler Inhale 2 puffs into the lungs every 6 (six) hours as needed. wheezing       . albuterol (PROVENTIL) (2.5 MG/3ML) 0.083% nebulizer solution Take 2.5 mg by nebulization every 6 (six) hours as needed.      . ALPRAZolam (XANAX) 1 MG tablet Take 1 mg by mouth daily.      Marland Kitchen amLODipine (NORVASC) 10 MG tablet Take 10 mg by mouth daily.      Marland Kitchen aspirin 325 MG tablet Take 325 mg by mouth daily.      . cloNIDine (CATAPRES) 0.2 MG tablet Take 0.2 mg by mouth 3 (three) times daily.      . cyclobenzaprine (FLEXERIL) 10 MG tablet Take 1 tablet (10 mg total) by mouth 3 (three) times daily as needed for muscle spasms.  20 tablet  0  . Fluticasone-Salmeterol (ADVAIR) 250-50 MCG/DOSE AEPB  Inhale 1 puff into the lungs 2 (two) times daily.  60 each  6  . furosemide (LASIX) 40 MG tablet Take 80 mg by mouth 2 (two) times daily.      . hydrALAZINE (APRESOLINE) 100 MG tablet Take 100 mg by mouth 3 (three) times daily.       Marland Kitchen HYDROcodone-acetaminophen (VICODIN) 5-500 MG per tablet Take 1 tablet by mouth every 6 (six) hours as needed.      Marland Kitchen losartan (COZAAR) 100 MG tablet Take 100 mg by mouth daily.       . metoprolol (LOPRESSOR) 50 MG tablet Take 100 mg by mouth 3 (three) times daily.       . pantoprazole (PROTONIX) 40 MG tablet Take 1 tablet (40 mg total) by mouth daily.  30 tablet  0  . potassium chloride (K-DUR) 10 MEQ tablet Take 10 mEq by mouth daily.      . traZODone (DESYREL) 100 MG tablet Take by mouth at bedtime.      Marland Kitchen tiotropium (SPIRIVA) 18 MCG inhalation capsule Place 1 capsule (18 mcg total) into inhaler and inhale daily. Every 2-3 days if needed  30 capsule  6  . azithromycin (ZITHROMAX) 250 MG tablet Take 1 tablet (250 mg total) by mouth daily. Take  two once then one daily until gone  6 each  0  . doxycycline (VIBRA-TABS) 100 MG tablet Take 100 mg by mouth 2 (two) times daily. Pt completed on 11-29-11      . Vitamin D, Ergocalciferol, (DRISDOL) 50000 UNITS CAPS Take 50,000 Units by mouth every 7 (seven) days.       No facility-administered medications prior to visit.        Review of Systems  Constitutional: Negative for fever and unexpected weight change.  HENT: Negative for ear pain, nosebleeds, congestion, sore throat, rhinorrhea, sneezing, trouble swallowing, dental problem, postnasal drip and sinus pressure.   Eyes: Negative for redness and itching.  Respiratory: Negative for cough, chest tightness, shortness of breath and wheezing.   Cardiovascular: Negative for palpitations and leg swelling.  Gastrointestinal: Negative for nausea and vomiting.  Genitourinary: Negative for dysuria.  Musculoskeletal: Negative for joint swelling.  Skin: Negative for rash.  Neurological: Negative for headaches.  Hematological: Does not bruise/bleed easily.  Psychiatric/Behavioral: Negative for dysphoric mood. The patient is not nervous/anxious.        Objective:   Physical Exam  There were no vitals filed for this visit.  BJY:[id number] obese AAF  in no distress,  normal affect  ENT: No lesions,  mouth clear,  oropharynx clear, no postnasal drip  Neck: No JVD, no TMG, no carotid bruits  Lungs: No use of accessory muscles, no dullness to percussion,distant BS  Cardiovascular: RRR, heart sounds normal, no murmur or gallops, 3+  peripheral edema  Abdomen: soft and NT, no HSM,  BS normal  Musculoskeletal: No deformities, no cyanosis or clubbing  Neuro: alert, non focal  Skin: Warm, no lesions or rashes       Assessment & Plan:   No problem-specific assessment & plan notes found for this encounter.   Updated Medication List Outpatient Encounter Prescriptions as of 04/25/2012  Medication Sig Dispense Refill  . albuterol  (PROVENTIL HFA;VENTOLIN HFA) 108 (90 BASE) MCG/ACT inhaler Inhale 2 puffs into the lungs every 6 (six) hours as needed. wheezing       . albuterol (PROVENTIL) (2.5 MG/3ML) 0.083% nebulizer solution Take 2.5 mg by nebulization every 6 (six) hours as needed.      Marland Kitchen  ALPRAZolam (XANAX) 1 MG tablet Take 1 mg by mouth daily.      Marland Kitchen amLODipine (NORVASC) 10 MG tablet Take 10 mg by mouth daily.      Marland Kitchen aspirin 325 MG tablet Take 325 mg by mouth daily.      . cloNIDine (CATAPRES) 0.2 MG tablet Take 0.2 mg by mouth 3 (three) times daily.      . cyclobenzaprine (FLEXERIL) 10 MG tablet Take 1 tablet (10 mg total) by mouth 3 (three) times daily as needed for muscle spasms.  20 tablet  0  . Fluticasone-Salmeterol (ADVAIR) 250-50 MCG/DOSE AEPB Inhale 1 puff into the lungs 2 (two) times daily.  60 each  6  . furosemide (LASIX) 40 MG tablet Take 80 mg by mouth 2 (two) times daily.      . hydrALAZINE (APRESOLINE) 100 MG tablet Take 100 mg by mouth 3 (three) times daily.       Marland Kitchen HYDROcodone-acetaminophen (VICODIN) 5-500 MG per tablet Take 1 tablet by mouth every 6 (six) hours as needed.      Marland Kitchen losartan (COZAAR) 100 MG tablet Take 100 mg by mouth daily.       . metoprolol (LOPRESSOR) 50 MG tablet Take 100 mg by mouth 3 (three) times daily.       . pantoprazole (PROTONIX) 40 MG tablet Take 1 tablet (40 mg total) by mouth daily.  30 tablet  0  . potassium chloride (K-DUR) 10 MEQ tablet Take 10 mEq by mouth daily.      Marland Kitchen tiotropium (SPIRIVA) 18 MCG inhalation capsule Place 18 mcg into inhaler and inhale daily as needed.      . traZODone (DESYREL) 100 MG tablet Take by mouth at bedtime.      . [DISCONTINUED] tiotropium (SPIRIVA) 18 MCG inhalation capsule Place 1 capsule (18 mcg total) into inhaler and inhale daily. Every 2-3 days if needed  30 capsule  6  . [DISCONTINUED] azithromycin (ZITHROMAX) 250 MG tablet Take 1 tablet (250 mg total) by mouth daily. Take two once then one daily until gone  6 each  0  . [DISCONTINUED]  doxycycline (VIBRA-TABS) 100 MG tablet Take 100 mg by mouth 2 (two) times daily. Pt completed on 11-29-11      . [DISCONTINUED] Vitamin D, Ergocalciferol, (DRISDOL) 50000 UNITS CAPS Take 50,000 Units by mouth every 7 (seven) days.       No facility-administered encounter medications on file as of 04/25/2012.

## 2012-04-26 DIAGNOSIS — J9610 Chronic respiratory failure, unspecified whether with hypoxia or hypercapnia: Secondary | ICD-10-CM | POA: Insufficient documentation

## 2012-04-26 NOTE — Assessment & Plan Note (Signed)
Chronic respiratory failure with hypoxemia Plan Continue current oxygen therapy

## 2012-04-26 NOTE — Assessment & Plan Note (Signed)
Chronic obstructive lung disease gold stage C. With associated small airways disease now with mild exacerbation Plan Prednisone 10mg  Take 4 for three days 3 for three days 2 for three days 1 for three days and stop Azithromycin 250mg  Take two once then one daily until gone No other medication changes Return 4 months

## 2012-05-01 ENCOUNTER — Other Ambulatory Visit: Payer: Self-pay | Admitting: Internal Medicine

## 2012-05-01 DIAGNOSIS — R109 Unspecified abdominal pain: Secondary | ICD-10-CM

## 2012-05-06 ENCOUNTER — Other Ambulatory Visit: Payer: Self-pay

## 2012-05-06 DIAGNOSIS — Z1231 Encounter for screening mammogram for malignant neoplasm of breast: Secondary | ICD-10-CM

## 2012-05-10 ENCOUNTER — Other Ambulatory Visit: Payer: Medicaid Other

## 2012-05-17 ENCOUNTER — Other Ambulatory Visit (HOSPITAL_COMMUNITY): Payer: Self-pay | Admitting: Obstetrics

## 2012-05-17 DIAGNOSIS — R109 Unspecified abdominal pain: Secondary | ICD-10-CM

## 2012-05-17 DIAGNOSIS — N949 Unspecified condition associated with female genital organs and menstrual cycle: Secondary | ICD-10-CM

## 2012-05-23 ENCOUNTER — Ambulatory Visit (HOSPITAL_COMMUNITY)
Admission: RE | Admit: 2012-05-23 | Discharge: 2012-05-23 | Disposition: A | Discharge status: Home / Self Care | Payer: Medicaid Other | Source: Ambulatory Visit | Attending: Obstetrics | Admitting: Obstetrics

## 2012-05-23 DIAGNOSIS — N949 Unspecified condition associated with female genital organs and menstrual cycle: Secondary | ICD-10-CM | POA: Insufficient documentation

## 2012-05-23 DIAGNOSIS — K811 Chronic cholecystitis: Secondary | ICD-10-CM | POA: Insufficient documentation

## 2012-05-23 DIAGNOSIS — K828 Other specified diseases of gallbladder: Secondary | ICD-10-CM | POA: Insufficient documentation

## 2012-05-23 DIAGNOSIS — R1031 Right lower quadrant pain: Secondary | ICD-10-CM | POA: Insufficient documentation

## 2012-05-23 DIAGNOSIS — R109 Unspecified abdominal pain: Secondary | ICD-10-CM

## 2012-05-24 ENCOUNTER — Other Ambulatory Visit (HOSPITAL_COMMUNITY): Payer: Medicaid Other

## 2012-05-27 ENCOUNTER — Other Ambulatory Visit (HOSPITAL_COMMUNITY): Payer: Self-pay | Admitting: Internal Medicine

## 2012-05-27 DIAGNOSIS — R109 Unspecified abdominal pain: Secondary | ICD-10-CM

## 2012-05-29 ENCOUNTER — Ambulatory Visit: Payer: Medicaid Other

## 2012-05-29 ENCOUNTER — Ambulatory Visit (HOSPITAL_COMMUNITY)
Admission: RE | Admit: 2012-05-29 | Discharge: 2012-05-29 | Disposition: A | Discharge status: Home / Self Care | Payer: Medicaid Other | Source: Ambulatory Visit | Attending: Family Medicine | Admitting: Family Medicine

## 2012-05-29 DIAGNOSIS — Z1231 Encounter for screening mammogram for malignant neoplasm of breast: Secondary | ICD-10-CM | POA: Insufficient documentation

## 2012-09-02 ENCOUNTER — Ambulatory Visit: Payer: Medicaid Other | Admitting: Critical Care Medicine

## 2012-09-06 ENCOUNTER — Encounter: Payer: Self-pay | Admitting: Critical Care Medicine

## 2012-09-06 ENCOUNTER — Ambulatory Visit (INDEPENDENT_AMBULATORY_CARE_PROVIDER_SITE_OTHER): Payer: Medicaid Other | Admitting: Critical Care Medicine

## 2012-09-06 VITALS — BP 166/94 | HR 77 | Temp 98.3°F | Ht 63.5 in | Wt >= 6400 oz

## 2012-09-06 DIAGNOSIS — J449 Chronic obstructive pulmonary disease, unspecified: Secondary | ICD-10-CM

## 2012-09-06 MED ORDER — PREDNISONE 10 MG PO TABS: ORAL_TABLET | ORAL | Status: DC

## 2012-09-06 MED ORDER — AZITHROMYCIN 250 MG PO TABS: 250.0000 mg | ORAL_TABLET | Freq: Every day | ORAL | Status: DC

## 2012-09-06 NOTE — Patient Instructions (Addendum)
Azithromycin 250mg  Take two once then one daily until gone sent to pharmacy Prednisone 10mg  Take 4 for three days 3 for three days 2 for three days 1 for three days and stop No change in other medications Return 4 months

## 2012-09-06 NOTE — Progress Notes (Signed)
Subjective:    Patient ID: Jenna Chandler, female    DOB: 03-01-1965, 47 y.o.   MRN: [id number]  HPI     47 y.o.   morbidly, obese, female, with a known history of asthma, and COPD, and a former smoker.   09/06/2012 Chief Complaint  Patient presents with  . 4 month follow up    Breathing has improved but does still have DOE.  Prod cough with thick, yellow mucus and wheezing x 2 wks.  No f/c/s.   Pt with two weeks cough thick yellow mucus.  No real chest pain. Notes some wheezing.  Notes more edema in feet.  Dyspnea is at baseline.     Past Medical History  Diagnosis Date  . Asthma   . Hypertension   . COPD (chronic obstructive pulmonary disease)     +/- asthma   . Obesity   . Manic, depressive   . Enlarged heart   . Fibromyalgia      No family history on file.   History   Social History  . Marital Status: Single    Spouse Name: N/A    Number of Children: N/A  . Years of Education: N/A   Occupational History  . Not on file.   Social History Main Topics  . Smoking status: Former Smoker -- 2.00 packs/day for 15 years    Types: Cigarettes    Quit date: 05/24/2002  . Smokeless tobacco: Never Used  . Alcohol Use: No  . Drug Use: No  . Sexually Active: Not on file   Other Topics Concern  . Not on file   Social History Narrative  . No narrative on file     No Known Allergies   Outpatient Prescriptions Prior to Visit  Medication Sig Dispense Refill  . albuterol (PROVENTIL HFA;VENTOLIN HFA) 108 (90 BASE) MCG/ACT inhaler Inhale 2 puffs into the lungs every 6 (six) hours as needed. wheezing       . albuterol (PROVENTIL) (2.5 MG/3ML) 0.083% nebulizer solution Take 2.5 mg by nebulization every 6 (six) hours as needed.      . ALPRAZolam (XANAX) 1 MG tablet Take 1 mg by mouth daily.      Marland Kitchen amLODipine (NORVASC) 10 MG tablet Take 10 mg by mouth daily.      Marland Kitchen aspirin 325 MG tablet Take 325 mg by mouth daily.      . cloNIDine (CATAPRES) 0.2 MG tablet Take 0.2 mg by  mouth 3 (three) times daily.      . cyclobenzaprine (FLEXERIL) 10 MG tablet Take 1 tablet (10 mg total) by mouth 3 (three) times daily as needed for muscle spasms.  20 tablet  0  . Fluticasone-Salmeterol (ADVAIR) 250-50 MCG/DOSE AEPB Inhale 1 puff into the lungs 2 (two) times daily.  60 each  6  . furosemide (LASIX) 40 MG tablet Take 80 mg by mouth 2 (two) times daily.      . hydrALAZINE (APRESOLINE) 100 MG tablet Take 100 mg by mouth 3 (three) times daily.       Marland Kitchen HYDROcodone-acetaminophen (VICODIN) 5-500 MG per tablet Take 1 tablet by mouth every 6 (six) hours as needed.      Marland Kitchen losartan (COZAAR) 100 MG tablet Take 100 mg by mouth daily.       . metoprolol (LOPRESSOR) 50 MG tablet Take 150 mg by mouth 3 (three) times daily.       . pantoprazole (PROTONIX) 40 MG tablet Take 1 tablet (40 mg total) by mouth  daily.  30 tablet  0  . potassium chloride (K-DUR) 10 MEQ tablet Take 10 mEq by mouth daily.      Marland Kitchen tiotropium (SPIRIVA) 18 MCG inhalation capsule Place 18 mcg into inhaler and inhale daily as needed.      . traZODone (DESYREL) 100 MG tablet Take by mouth at bedtime.      Marland Kitchen azithromycin (ZITHROMAX) 250 MG tablet Take two once then one daily until gone  6 tablet  0  . predniSONE (DELTASONE) 10 MG tablet Take 4 for three days 3 for three days 2 for three days 1 for three days and stop  30 tablet  0   No facility-administered medications prior to visit.        Review of Systems  Constitutional: Negative for fever and unexpected weight change.  HENT: Negative for ear pain, nosebleeds, congestion, sore throat, rhinorrhea, sneezing, trouble swallowing, dental problem, postnasal drip and sinus pressure.   Eyes: Negative for redness and itching.  Respiratory: Negative for cough, chest tightness, shortness of breath and wheezing.   Cardiovascular: Negative for palpitations and leg swelling.  Gastrointestinal: Negative for nausea and vomiting.  Genitourinary: Negative for dysuria.   Musculoskeletal: Negative for joint swelling.  Skin: Negative for rash.  Neurological: Negative for headaches.  Hematological: Does not bruise/bleed easily.  Psychiatric/Behavioral: Negative for dysphoric mood. The patient is not nervous/anxious.        Objective:   Physical Exam  Filed Vitals:   09/06/12 1550  BP: 166/94  Pulse: 77  Temp: 98.3 F (36.8 C)  TempSrc: Oral  Height: 5' 3.5" (1.613 m)  Weight: 408 lb 9.6 oz (185.34 kg)  SpO2: 99%    WGN:FAOZHYQM obese AAF  in no distress,  normal affect  ENT: No lesions,  mouth clear,  oropharynx clear, no postnasal drip  Neck: No JVD, no TMG, no carotid bruits  Lungs: No use of accessory muscles, no dullness to percussion,distant BS  Cardiovascular: RRR, heart sounds normal, no murmur or gallops, 3+  peripheral edema  Abdomen: soft and NT, no HSM,  BS normal  Musculoskeletal: No deformities, no cyanosis or clubbing  Neuro: alert, non focal  Skin: Warm, no lesions or rashes       Assessment & Plan:   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 albuterol (PROVENTIL HFA;VENTOLIN HFA) 108 (90 BASE) MCG/ACT inhaler Inhale 2 puffs into the lungs every 6 (six) hours as needed. wheezing       . albuterol (PROVENTIL) (2.5 MG/3ML) 0.083% nebulizer solution Take 2.5 mg by nebulization every 6 (six) hours as needed.      . ALPRAZolam (XANAX) 1 MG tablet Take 1 mg by mouth daily.      Marland Kitchen amLODipine (NORVASC) 10 MG tablet Take 10 mg by mouth daily.      Marland Kitchen aspirin 325 MG tablet Take 325 mg by mouth daily.      Marland Kitchen BIOTIN PO Take 1 tablet by mouth daily.       . cloNIDine (CATAPRES) 0.2 MG tablet Take 0.2 mg by mouth 3 (three) times daily.      Marland Kitchen  cyclobenzaprine (FLEXERIL) 10 MG tablet Take 1 tablet (10 mg total) by mouth 3 (three) times daily as needed for muscle spasms.  20 tablet  0  . Fluticasone-Salmeterol (ADVAIR) 250-50 MCG/DOSE AEPB Inhale 1 puff into the lungs 2 (two) times daily.  60 each  6  . furosemide (LASIX) 40 MG tablet Take 80 mg by mouth 2 (two) times daily.      . hydrALAZINE (APRESOLINE) 100 MG tablet Take 100 mg by mouth 3 (three) times daily.       Marland Kitchen HYDROcodone-acetaminophen (VICODIN) 5-500 MG per tablet Take 1 tablet by mouth every 6 (six) hours as needed.      Marland Kitchen losartan (COZAAR) 100 MG tablet Take 100 mg by mouth daily.       . metoprolol (LOPRESSOR) 50 MG tablet Take 150 mg by mouth 3 (three) times daily.       . pantoprazole (PROTONIX) 40 MG tablet Take 1 tablet (40 mg total) by mouth daily.  30 tablet  0  . potassium chloride (K-DUR) 10 MEQ tablet Take 10 mEq by mouth daily.      Marland Kitchen tiotropium (SPIRIVA) 18 MCG inhalation capsule Place 18 mcg into inhaler and inhale daily as needed.      . traZODone (DESYREL) 100 MG tablet Take by mouth at bedtime.      Marland Kitchen azithromycin (ZITHROMAX) 250 MG tablet Take 1 tablet (250 mg total) by mouth daily. Take two once then one daily until gone  6 each  0  . predniSONE (DELTASONE) 10 MG tablet Take 4 for three days 3 for three days 2 for three days 1 for three days and stop  30 tablet  0  . [DISCONTINUED] azithromycin (ZITHROMAX) 250 MG tablet Take two once then one daily until gone  6 tablet  0  . [DISCONTINUED] predniSONE (DELTASONE) 10 MG tablet Take 4 for three days 3 for three days 2 for three days 1 for three days and stop  30 tablet  0   No facility-administered encounter medications on file as of 09/06/2012.

## 2012-09-08 NOTE — Assessment & Plan Note (Signed)
Gold C Copd with mild exacerbation Plan Azithromycin 250mg  Take two once then one daily until gone sent to pharmacy Prednisone 10mg  Take 4 for three days 3 for three days 2 for three days 1 for three days and stop No change in other medications Return 4 months

## 2012-09-13 ENCOUNTER — Telehealth: Payer: Self-pay | Admitting: Critical Care Medicine

## 2012-09-13 MED ORDER — DOXYCYCLINE HYCLATE 100 MG PO TABS: 100.0000 mg | ORAL_TABLET | Freq: Every day | ORAL | Status: DC

## 2012-09-13 NOTE — Telephone Encounter (Signed)
Doxy 100 daily x 7day sent to  [facility] Pt aware.

## 2012-09-13 NOTE — Telephone Encounter (Signed)
Doxy 100 daily x 7ds Ok

## 2012-09-13 NOTE — Telephone Encounter (Signed)
Spoke with pt-- Pt given azithromycin at OV 09/06/12 with PW. Pt states that she is no better.  Pt c/o increased mucus production with brown/yellow thick mucus now tinged with blood, SOB and wheezing. Pt requesting Doxy be called into pharmacy--pt knows this works better for her.   [facility] [address] No Known Allergies  Pt aware that PW out of office until 09/23/12 Please advise Dr Vassie Loll, for you are Doc of the Morning since PW is out of office. Thanks!

## 2012-09-20 ENCOUNTER — Encounter (HOSPITAL_COMMUNITY): Payer: Self-pay | Admitting: Emergency Medicine

## 2012-09-20 ENCOUNTER — Emergency Department (HOSPITAL_COMMUNITY)
Admission: EM | Admit: 2012-09-20 | Discharge: 2012-09-20 | Disposition: A | Discharge status: Home / Self Care | Payer: Medicaid Other | Attending: Emergency Medicine | Admitting: Emergency Medicine

## 2012-09-20 DIAGNOSIS — J449 Chronic obstructive pulmonary disease, unspecified: Secondary | ICD-10-CM | POA: Insufficient documentation

## 2012-09-20 DIAGNOSIS — R259 Unspecified abnormal involuntary movements: Secondary | ICD-10-CM | POA: Insufficient documentation

## 2012-09-20 DIAGNOSIS — I517 Cardiomegaly: Secondary | ICD-10-CM | POA: Insufficient documentation

## 2012-09-20 DIAGNOSIS — J4489 Other specified chronic obstructive pulmonary disease: Secondary | ICD-10-CM | POA: Insufficient documentation

## 2012-09-20 DIAGNOSIS — Z79899 Other long term (current) drug therapy: Secondary | ICD-10-CM | POA: Insufficient documentation

## 2012-09-20 DIAGNOSIS — Z7982 Long term (current) use of aspirin: Secondary | ICD-10-CM | POA: Insufficient documentation

## 2012-09-20 DIAGNOSIS — J45909 Unspecified asthma, uncomplicated: Secondary | ICD-10-CM | POA: Insufficient documentation

## 2012-09-20 DIAGNOSIS — IMO0002 Reserved for concepts with insufficient information to code with codable children: Secondary | ICD-10-CM | POA: Insufficient documentation

## 2012-09-20 DIAGNOSIS — Z87891 Personal history of nicotine dependence: Secondary | ICD-10-CM | POA: Insufficient documentation

## 2012-09-20 DIAGNOSIS — F319 Bipolar disorder, unspecified: Secondary | ICD-10-CM | POA: Insufficient documentation

## 2012-09-20 DIAGNOSIS — I10 Essential (primary) hypertension: Secondary | ICD-10-CM | POA: Insufficient documentation

## 2012-09-20 DIAGNOSIS — IMO0001 Reserved for inherently not codable concepts without codable children: Secondary | ICD-10-CM | POA: Insufficient documentation

## 2012-09-20 MED ORDER — DIPHENHYDRAMINE HCL 25 MG PO CAPS
25.0000 mg | ORAL_CAPSULE | Freq: Once | ORAL | Status: AC
  Administered 2012-09-20: 25 mg via ORAL
  Filled 2012-09-20: qty 1
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
## 2012-09-22 NOTE — ED Provider Notes (Signed)
Medical screening examination/treatment/procedure(s) were performed by non-physician practitioner and as supervising physician I was immediately available for consultation/collaboration.    Vartan Kerins D Ariyan Sinnett, MD 09/22/12 0233 

## 2012-10-03 ENCOUNTER — Encounter: Payer: Self-pay | Admitting: Internal Medicine

## 2012-10-28 ENCOUNTER — Emergency Department (HOSPITAL_COMMUNITY)
Admission: EM | Admit: 2012-10-28 | Discharge: 2012-10-28 | Disposition: A | Discharge status: Home / Self Care | Payer: Medicaid Other | Attending: Emergency Medicine | Admitting: Emergency Medicine

## 2012-10-28 ENCOUNTER — Encounter (HOSPITAL_COMMUNITY): Payer: Self-pay

## 2012-10-28 ENCOUNTER — Emergency Department (HOSPITAL_COMMUNITY): Payer: Medicaid Other

## 2012-10-28 DIAGNOSIS — Z79899 Other long term (current) drug therapy: Secondary | ICD-10-CM | POA: Insufficient documentation

## 2012-10-28 DIAGNOSIS — IMO0001 Reserved for inherently not codable concepts without codable children: Secondary | ICD-10-CM | POA: Insufficient documentation

## 2012-10-28 DIAGNOSIS — M79605 Pain in left leg: Secondary | ICD-10-CM

## 2012-10-28 DIAGNOSIS — E669 Obesity, unspecified: Secondary | ICD-10-CM | POA: Insufficient documentation

## 2012-10-28 DIAGNOSIS — M79609 Pain in unspecified limb: Secondary | ICD-10-CM | POA: Insufficient documentation

## 2012-10-28 DIAGNOSIS — Z7982 Long term (current) use of aspirin: Secondary | ICD-10-CM | POA: Insufficient documentation

## 2012-10-28 DIAGNOSIS — F319 Bipolar disorder, unspecified: Secondary | ICD-10-CM | POA: Insufficient documentation

## 2012-10-28 DIAGNOSIS — M79604 Pain in right leg: Secondary | ICD-10-CM

## 2012-10-28 DIAGNOSIS — I10 Essential (primary) hypertension: Secondary | ICD-10-CM | POA: Insufficient documentation

## 2012-10-28 DIAGNOSIS — Z87891 Personal history of nicotine dependence: Secondary | ICD-10-CM | POA: Insufficient documentation

## 2012-10-28 DIAGNOSIS — IMO0002 Reserved for concepts with insufficient information to code with codable children: Secondary | ICD-10-CM | POA: Insufficient documentation

## 2012-10-28 DIAGNOSIS — J449 Chronic obstructive pulmonary disease, unspecified: Secondary | ICD-10-CM | POA: Insufficient documentation

## 2012-10-28 DIAGNOSIS — J4489 Other specified chronic obstructive pulmonary disease: Secondary | ICD-10-CM | POA: Insufficient documentation

## 2012-10-28 MED ORDER — HYDROCODONE-ACETAMINOPHEN 5-325 MG PO TABS
1.0000 | ORAL_TABLET | Freq: Once | ORAL | Status: AC
  Administered 2012-10-28: 1 via ORAL
  Filled 2012-10-28: qty 1

## 2012-10-28 MED ORDER — PREDNISONE 20 MG PO TABS
60.0000 mg | ORAL_TABLET | Freq: Once | ORAL | Status: AC
  Administered 2012-10-28: 60 mg via ORAL
  Filled 2012-10-28: qty 3

## 2012-10-28 MED ORDER — ALBUTEROL SULFATE (5 MG/ML) 0.5% IN NEBU
5.0000 mg | INHALATION_SOLUTION | Freq: Once | RESPIRATORY_TRACT | Status: AC
  Administered 2012-10-28: 5 mg via RESPIRATORY_TRACT
  Filled 2012-10-28: qty 1

## 2012-10-28 MED ORDER — HYDROCODONE-ACETAMINOPHEN 5-325 MG PO TABS: 1.0000 | ORAL_TABLET | Freq: Four times a day (QID) | ORAL | Status: DC | PRN

## 2012-10-28 MED ORDER — IPRATROPIUM BROMIDE 0.02 % IN SOLN
0.5000 mg | RESPIRATORY_TRACT | Status: AC
  Administered 2012-10-28: 0.5 mg via RESPIRATORY_TRACT
  Filled 2012-10-28: qty 2.5

## 2012-10-28 NOTE — ED Notes (Signed)
Patient transported to X-ray 

## 2012-10-28 NOTE — ED Notes (Signed)
Bed: WA03 Expected date: 10/28/12 Expected time: 7:40 PM Means of arrival: Ambulance Comments: Flank pain

## 2012-10-28 NOTE — ED Notes (Signed)
Pt c/o bilateral calf/leg pain x2wks, states no injury, states afraid of blood clots

## 2012-10-28 NOTE — ED Provider Notes (Signed)
CSN: [id number]     Arrival date & time 10/28/12  1735 History   First MD Initiated Contact with Patient 10/28/12 1941     Chief Complaint  Patient presents with  . Leg Pain   (Consider location/radiation/quality/duration/timing/severity/associated sxs/prior Treatment) HPI Patient presents emergency department with bilateral calf and leg pain for 2 weeks.  The patient, states she does not have any chest pain, shortness of breath, nausea, vomiting, headache, blurred vision, weakness, numbness, dizziness, fever, abdominal pain, back pain, or syncope.  Patient, states, that she's not take any medications other than her prescribed medicines for her discomfort.  Patient, states nothing seems to make her condition, better or worse. Past Medical History  Diagnosis Date  . Asthma   . Hypertension   . COPD (chronic obstructive pulmonary disease)     +/- asthma   . Obesity   . Manic, depressive   . Enlarged heart   . Fibromyalgia    History reviewed. No pertinent past surgical history. No family history on file. History  Substance Use Topics  . Smoking status: Former Smoker -- 2.00 packs/day for 15 years    Types: Cigarettes    Quit date: 05/24/2002  . Smokeless tobacco: Never Used  . Alcohol Use: No   OB History   Grav Para Term Preterm Abortions TAB SAB Ect Mult Living                 Review of Systems All other systems negative except as documented in the HPI. All pertinent positives and negatives as reviewed in the HPI.  Allergies  Review of patient's allergies indicates no known allergies.  Home Medications   Current Outpatient Rx  Name  Route  Sig  Dispense  Refill  . albuterol (PROVENTIL HFA;VENTOLIN HFA) 108 (90 BASE) MCG/ACT inhaler   Inhalation   Inhale 2 puffs into the lungs every 6 (six) hours as needed. wheezing          . albuterol (PROVENTIL) (2.5 MG/3ML) 0.083% nebulizer solution   Nebulization   Take 2.5 mg by nebulization every 6 (six) hours as needed.        . ALPRAZolam (XANAX) 1 MG tablet   Oral   Take 1 mg by mouth 2 (two) times daily as needed for anxiety.          Marland Kitchen amLODipine (NORVASC) 10 MG tablet   Oral   Take 10 mg by mouth daily.         Marland Kitchen aspirin 325 MG tablet   Oral   Take 325 mg by mouth daily.         . cloNIDine (CATAPRES) 0.2 MG tablet   Oral   Take 0.2 mg by mouth 3 (three) times daily.         . cyclobenzaprine (FLEXERIL) 10 MG tablet   Oral   Take 1 tablet (10 mg total) by mouth 3 (three) times daily as needed for muscle spasms.   20 tablet   0   . Fluticasone-Salmeterol (ADVAIR) 250-50 MCG/DOSE AEPB   Inhalation   Inhale 1 puff into the lungs 2 (two) times daily.   60 each   6   . furosemide (LASIX) 40 MG tablet   Oral   Take 80 mg by mouth 2 (two) times daily.         . hydrALAZINE (APRESOLINE) 100 MG tablet   Oral   Take 100 mg by mouth 3 (three) times daily.          Marland Kitchen  HYDROcodone-acetaminophen (VICODIN) 5-500 MG per tablet   Oral   Take 1 tablet by mouth every 6 (six) hours as needed.         Marland Kitchen losartan (COZAAR) 100 MG tablet   Oral   Take 100 mg by mouth daily.          . metoprolol (LOPRESSOR) 50 MG tablet   Oral   Take 150 mg by mouth 3 (three) times daily.          . pantoprazole (PROTONIX) 40 MG tablet   Oral   Take 1 tablet (40 mg total) by mouth daily.   30 tablet   0   . potassium chloride (K-DUR) 10 MEQ tablet   Oral   Take 10 mEq by mouth daily.         Marland Kitchen tiotropium (SPIRIVA) 18 MCG inhalation capsule   Inhalation   Place 18 mcg into inhaler and inhale daily as needed.         . traZODone (DESYREL) 100 MG tablet   Oral   Take 100 mg by mouth at bedtime.           BP 161/100  Pulse 84  Temp(Src) 98.7 F (37.1 C) (Oral)  SpO2 100%  LMP 10/21/2012 Physical Exam  Nursing note and vitals reviewed. Constitutional: She is oriented to person, place, and time. She appears well-developed and well-nourished. No distress.  HENT:  Head:  Normocephalic and atraumatic.  Eyes: Pupils are equal, round, and reactive to light.  Neck: Normal range of motion. Neck supple.  Cardiovascular: Normal rate, regular rhythm and normal heart sounds.  Exam reveals no gallop and no friction rub.   No murmur heard. Pulmonary/Chest: Effort normal and breath sounds normal. No respiratory distress.  Musculoskeletal:  Is difficult to assess the patient's legs size due to her body habitus patient possibly has some edema noted to her legs  Neurological: She is alert and oriented to person, place, and time.  Skin: Skin is warm and dry. No rash noted. No erythema.    ED Course  Procedures (including critical care time)  Patient be sent for DVT study.  For further evaluation of this pain.  Patient's best return here as needed.  Also advised to follow up with her primary care Dr. feels less likely.  She has a DVT, but with her pain, and difficult exam due to her body habitus DVT studies were ordered. MDM     Carlyle Dolly, PA-C 11/04/12 1746   Medical screening examination/treatment/procedure(s) were performed by non-physician practitioner and as supervising physician I was immediately available for consultation/collaboration.   Derwood Kaplan, MD 11/11/12 [phone]

## 2012-10-28 NOTE — ED Notes (Signed)
Resp called for breathing tx

## 2012-10-29 ENCOUNTER — Ambulatory Visit (HOSPITAL_COMMUNITY)
Admission: RE | Admit: 2012-10-29 | Discharge: 2012-10-29 | Disposition: A | Discharge status: Home / Self Care | Payer: Medicaid Other | Source: Ambulatory Visit | Attending: Internal Medicine | Admitting: Internal Medicine

## 2012-10-29 DIAGNOSIS — M79609 Pain in unspecified limb: Secondary | ICD-10-CM

## 2012-10-29 NOTE — Progress Notes (Signed)
VASCULAR LAB PRELIMINARY  PRELIMINARY  PRELIMINARY  PRELIMINARY  Bilateral lower extremity venous duplex  completed.    Preliminary report:  Bilateral:  No evidence of DVT, superficial thrombosis, or Baker's Cyst.  Technically limited by body habitus.    Brianni Manthe, RVT 10/29/2012, 9:39 AM

## 2012-12-02 ENCOUNTER — Telehealth: Payer: Self-pay | Admitting: Critical Care Medicine

## 2012-12-02 MED ORDER — DOXYCYCLINE HYCLATE 100 MG PO TABS: 100.0000 mg | ORAL_TABLET | Freq: Two times a day (BID) | ORAL | Status: DC

## 2012-12-02 NOTE — Telephone Encounter (Signed)
I spoke with pt. She c/o productive cough w/ green-brown thick phlem, chest tx and wheezing no more than normal, chest congestion x 3 days. She is requesting to have doxy called in if possible. Please advise Dr. Delford Field thanks Last OV 09/06/12 Pending 12/24/12 No Known Allergies

## 2012-12-02 NOTE — Telephone Encounter (Signed)
Pt aware of recs, rx sent

## 2012-12-02 NOTE — Telephone Encounter (Signed)
Doxycycline 100 mg twice daily for 7 days 

## 2012-12-02 NOTE — Telephone Encounter (Signed)
lmomtcb x1 for pt 

## 2012-12-18 ENCOUNTER — Telehealth: Payer: Self-pay | Admitting: *Deleted

## 2012-12-18 NOTE — Telephone Encounter (Signed)
Pt has a pending OV with PW on 12/24/12 at 3 pm.  PW will be out of the office at this time.  Pt will need to reschedule.  I called pt's #.  Received her VM stating she is unable to make or receive calls at this time given her phone is not working properly.  There was an option to leave a VM, so I did lmtcb on her #.  In the msg, there was a # to call stating it was her mother's number - [phone].  I called this # as well and had to lmomtcb.

## 2012-12-18 NOTE — Telephone Encounter (Signed)
Spoke with pt.  We have rescheduled her appt to 12/25/12 at 945 am at [facility] with Dr. Delford Field.  Pt aware.

## 2012-12-18 NOTE — Telephone Encounter (Signed)
Returning call can be reached at [phone].Jenna Chandler

## 2012-12-24 ENCOUNTER — Ambulatory Visit: Payer: Medicaid Other | Admitting: Critical Care Medicine

## 2012-12-25 ENCOUNTER — Ambulatory Visit: Payer: Medicaid Other | Admitting: Critical Care Medicine

## 2013-01-16 ENCOUNTER — Encounter: Payer: Self-pay | Admitting: Cardiology

## 2013-01-16 ENCOUNTER — Ambulatory Visit (INDEPENDENT_AMBULATORY_CARE_PROVIDER_SITE_OTHER): Payer: Medicaid Other | Admitting: Cardiology

## 2013-01-16 VITALS — BP 149/92 | HR 56 | Ht 64.0 in | Wt >= 6400 oz

## 2013-01-16 DIAGNOSIS — I10 Essential (primary) hypertension: Secondary | ICD-10-CM

## 2013-01-16 DIAGNOSIS — G4733 Obstructive sleep apnea (adult) (pediatric): Secondary | ICD-10-CM

## 2013-01-16 DIAGNOSIS — I509 Heart failure, unspecified: Secondary | ICD-10-CM

## 2013-01-16 DIAGNOSIS — I5032 Chronic diastolic (congestive) heart failure: Secondary | ICD-10-CM

## 2013-01-16 LAB — BASIC METABOLIC PANEL
BUN: 5 mg/dL — ABNORMAL LOW (ref 6–23)
CO2: 32 mEq/L (ref 19–32)
Calcium: 8.8 mg/dL (ref 8.4–10.5)
Chloride: 102 mEq/L (ref 96–112)
Creatinine, Ser: 0.7 mg/dL (ref 0.4–1.2)
Glucose, Bld: 87 mg/dL (ref 70–99)

## 2013-01-16 MED ORDER — CLONIDINE HCL 0.3 MG PO TABS: 0.3000 mg | ORAL_TABLET | Freq: Three times a day (TID) | ORAL | Status: DC

## 2013-01-16 NOTE — Patient Instructions (Signed)
Your physician has recommended you make the following change in your medication: Increase Clonidine to 0.3 MG Three Times Daily.   Your physician recommends that you go to the lab today for a BMET  Your physician has requested that you regularly monitor and record your blood pressure readings at home. Please use the same machine at the same time of day to check your readings for a week and call us with the results.   We will Contact [facility] to call you about a download for your CPAP machine.   Your physician recommends that you schedule a follow-up appointment in: 3 Months with Dr Mayford Knife

## 2013-01-16 NOTE — Progress Notes (Signed)
[address] [place], [place]  [address] Phone: [phone] Fax:  [phone]  Date:  01/16/2013   ID:  Jenna Chandler, DOB [birth year], MRN [id number]  PCP:  Dorrene German, MD  Cardiologist:  Armanda Magic, MD     History of Present Illness: Jenna Chandler is a 47 y.o. female with a history of chronic diastolic CHF, HTN, morbid obesity and OSA on CPAP.  She is doing well.  She denies any  Dizziness or syncope.  She tolerates her CPAP well.  She tolerates her full face mask and feels that she may need more pressure.  She feels rested when she gets up in the am but still gets sleepy in the evening. She is trying to diet but she does not get much aerobic exercise.  She has chronic chest pain which she describes as a pinching sensation that has not changed since I saw her last.  She has chronic DOE which is unchanged and stable.  She has chronic LE edema which is stable.     Wt Readings from Last 3 Encounters:  01/16/13 405 lb (183.707 kg)  09/06/12 408 lb 9.6 oz (185.34 kg)  04/11/12 400 lb (181.439 kg)     Past Medical History  Diagnosis Date  . Asthma   . Hypertension   . COPD (chronic obstructive pulmonary disease)     +/- asthma   . Obesity   . Manic, depressive   . Enlarged heart   . Fibromyalgia     Current Outpatient Prescriptions  Medication Sig Dispense Refill  . albuterol (PROVENTIL HFA;VENTOLIN HFA) 108 (90 BASE) MCG/ACT inhaler Inhale 2 puffs into the lungs every 6 (six) hours as needed. wheezing       . albuterol (PROVENTIL) (2.5 MG/3ML) 0.083% nebulizer solution Take 2.5 mg by nebulization every 6 (six) hours as needed.      . ALPRAZolam (XANAX) 1 MG tablet Take 1 mg by mouth 2 (two) times daily as needed for anxiety.       Marland Kitchen amLODipine (NORVASC) 10 MG tablet Take 10 mg by mouth daily.      Marland Kitchen aspirin 325 MG tablet Take 325 mg by mouth daily.      . cloNIDine (CATAPRES) 0.2 MG tablet Take 0.2 mg by mouth 3 (three) times daily.      .  cyclobenzaprine (FLEXERIL) 10 MG tablet Take 1 tablet (10 mg total) by mouth 3 (three) times daily as needed for muscle spasms.  20 tablet  0  . Fluticasone-Salmeterol (ADVAIR) 250-50 MCG/DOSE AEPB Inhale 1 puff into the lungs 2 (two) times daily.  60 each  6  . furosemide (LASIX) 40 MG tablet Take 80 mg by mouth 2 (two) times daily.      . hydrALAZINE (APRESOLINE) 100 MG tablet Take 100 mg by mouth 3 (three) times daily.      Marland Kitchen HYDROcodone-acetaminophen (VICODIN) 5-500 MG per tablet Take 1 tablet by mouth every 6 (six) hours as needed.      Marland Kitchen losartan (COZAAR) 100 MG tablet Take 100 mg by mouth daily.       . metoprolol (LOPRESSOR) 50 MG tablet Take 150 mg by mouth 3 (three) times daily.       . pantoprazole (PROTONIX) 40 MG tablet Take 1 tablet (40 mg total) by mouth daily.  30 tablet  0  . tiotropium (SPIRIVA) 18 MCG inhalation capsule Place 18 mcg into inhaler and inhale daily as needed.      Marland Kitchen  traZODone (DESYREL) 100 MG tablet Take 100 mg by mouth at bedtime.       . potassium chloride (K-DUR) 10 MEQ tablet Take 10 mEq by mouth daily.       No current facility-administered medications for this visit.    Allergies:   No Known Allergies  Social History:  The patient  reports that she quit smoking about 10 years ago. Her smoking use included Cigarettes. She has a 30 pack-year smoking history. She has never used smokeless tobacco. She reports that she does not drink alcohol or use illicit drugs.   Family History:  The patient's family history is not on file.   ROS:  Please see the history of present illness.      All other systems reviewed and negative.   PHYSICAL EXAM: VS:  BP 149/92  Pulse 56  Ht 5\' 4"  (1.626 m)  Wt 405 lb (183.707 kg)  BMI 69.48 kg/m2 Well nourished, well developed, in no acute distress HEENT: normal Neck: no JVD Cardiac:  normal S1, S2; RRR; no murmur Lungs:  clear to auscultation bilaterally, no wheezing, rhonchi or rales Abd: soft, nontender, no  hepatomegaly Ext: 1+ edema Skin: warm and dry Neuro:  CNs 2-12 intact, no focal abnormalities noted       ASSESSMENT AND PLAN:  1. OSA on CPAP  - I will get a d/l from her DME 2. HTN - mildly elevated today - earlier today her BP was 173/122 mmHg  - continue amlodipine/Losartan/metoprolol/hydralazine   - increase clonidine to 0.3mg  TID  - check BP daily for a week and call with the results 3. CHronic diastolic CHF- she has some mild LE edema which I think is probably due to morbid obesity  - continue Lasix/beta blocker and ARB   - check BMET 4. Morbid obesity - encouraged her to try to get in to an exercise program  Followup with me in 3 months  Signed, Armanda Magic, MD 01/16/2013 3:37 PM

## 2013-01-17 ENCOUNTER — Encounter: Payer: Self-pay | Admitting: General Surgery

## 2013-01-20 ENCOUNTER — Encounter: Payer: Self-pay | Admitting: Critical Care Medicine

## 2013-01-20 ENCOUNTER — Ambulatory Visit (INDEPENDENT_AMBULATORY_CARE_PROVIDER_SITE_OTHER): Payer: Medicaid Other | Admitting: Critical Care Medicine

## 2013-01-20 VITALS — BP 146/102 | HR 64 | Temp 97.8°F | Ht 64.0 in | Wt >= 6400 oz

## 2013-01-20 DIAGNOSIS — J449 Chronic obstructive pulmonary disease, unspecified: Secondary | ICD-10-CM

## 2013-01-20 DIAGNOSIS — J019 Acute sinusitis, unspecified: Secondary | ICD-10-CM

## 2013-01-20 MED ORDER — DOXYCYCLINE HYCLATE 100 MG PO TBEC: 100.0000 mg | DELAYED_RELEASE_TABLET | Freq: Two times a day (BID) | ORAL | Status: DC

## 2013-01-20 MED ORDER — CICLESONIDE 50 MCG/ACT NA SUSP: 2.0000 | Freq: Every day | NASAL | Status: DC

## 2013-01-20 NOTE — Progress Notes (Signed)
Subjective:    Patient ID: Jenna Chandler, female    DOB: August 27, 1965, 47 y.o.   MRN: [id number]  HPI  47 y.o.   morbidly, obese, female, with a known history of asthma, and COPD, and a former smoker.    01/20/2013 Chief Complaint  Patient presents with  . COPD    Breathing is unchanged. Reports DOE, coughing with production of brown mucus. Denies chest tightness.  Dyspnea is the same.  SOme mucus is brown yellow.  This is a change.  Also tinge of blood.  Nose will drain down the back.   Edema is the same in the feet   Past Medical History  Diagnosis Date  . Asthma   . Hypertension   . COPD (chronic obstructive pulmonary disease)     +/- asthma   . Obesity   . Manic, depressive   . Enlarged heart   . Fibromyalgia      No family history on file.   History   Social History  . Marital Status: Single    Spouse Name: N/A    Number of Children: N/A  . Years of Education: N/A   Occupational History  . Not on file.   Social History Main Topics  . Smoking status: Former Smoker -- 2.00 packs/day for 15 years    Types: Cigarettes    Quit date: 05/24/2002  . Smokeless tobacco: Never Used  . Alcohol Use: No  . Drug Use: No  . Sexual Activity: Not on file   Other Topics Concern  . Not on file   Social History Narrative  . No narrative on file     No Known Allergies   Outpatient Prescriptions Prior to Visit  Medication Sig Dispense Refill  . albuterol (PROVENTIL HFA;VENTOLIN HFA) 108 (90 BASE) MCG/ACT inhaler Inhale 2 puffs into the lungs every 6 (six) hours as needed. wheezing       . albuterol (PROVENTIL) (2.5 MG/3ML) 0.083% nebulizer solution Take 2.5 mg by nebulization every 6 (six) hours as needed.      . ALPRAZolam (XANAX) 1 MG tablet Take 1 mg by mouth 2 (two) times daily as needed for anxiety.       Marland Kitchen amLODipine (NORVASC) 10 MG tablet Take 10 mg by mouth daily.      Marland Kitchen aspirin 325 MG tablet Take 325 mg by mouth daily.      . cloNIDine (CATAPRES) 0.3 MG  tablet Take 1 tablet (0.3 mg total) by mouth 3 (three) times daily.  90 tablet  11  . Fluticasone-Salmeterol (ADVAIR) 250-50 MCG/DOSE AEPB Inhale 1 puff into the lungs 2 (two) times daily.  60 each  6  . furosemide (LASIX) 40 MG tablet Take 80 mg by mouth 2 (two) times daily.      . hydrALAZINE (APRESOLINE) 100 MG tablet Take 100 mg by mouth 3 (three) times daily.      Marland Kitchen HYDROcodone-acetaminophen (VICODIN) 5-500 MG per tablet Take 1 tablet by mouth every 6 (six) hours as needed.      Marland Kitchen losartan (COZAAR) 100 MG tablet Take 100 mg by mouth daily.       . metoprolol (LOPRESSOR) 50 MG tablet Take 150 mg by mouth 3 (three) times daily.       . pantoprazole (PROTONIX) 40 MG tablet Take 1 tablet (40 mg total) by mouth daily.  30 tablet  0  . potassium chloride (K-DUR) 10 MEQ tablet Take 10 mEq by mouth daily.      Marland Kitchen  tiotropium (SPIRIVA) 18 MCG inhalation capsule Place 18 mcg into inhaler and inhale daily as needed.      . traZODone (DESYREL) 100 MG tablet Take 100 mg by mouth at bedtime.       . cyclobenzaprine (FLEXERIL) 10 MG tablet Take 1 tablet (10 mg total) by mouth 3 (three) times daily as needed for muscle spasms.  20 tablet  0   No facility-administered medications prior to visit.        Review of Systems  Constitutional: Negative for fever and unexpected weight change.  HENT: Negative for congestion, dental problem, ear pain, nosebleeds, postnasal drip, rhinorrhea, sinus pressure, sneezing, sore throat and trouble swallowing.   Eyes: Negative for redness and itching.  Respiratory: Negative for cough, chest tightness, shortness of breath and wheezing.   Cardiovascular: Negative for palpitations and leg swelling.  Gastrointestinal: Negative for nausea and vomiting.  Genitourinary: Negative for dysuria.  Musculoskeletal: Negative for joint swelling.  Skin: Negative for rash.  Neurological: Negative for headaches.  Hematological: Does not bruise/bleed easily.  Psychiatric/Behavioral:  Negative for dysphoric mood. The patient is not nervous/anxious.        Objective:   Physical Exam  Filed Vitals:   01/20/13 1539 01/20/13 1540  BP:  146/102  Pulse:  64  Temp: 97.8 F (36.6 C)   TempSrc: Oral   Height: 5\' 4"  (1.626 m)   Weight: 407 lb (184.614 kg)   SpO2:  99%    ZOX:[id number] obese AAF  in no distress,  normal affect  ENT: No lesions,  mouth clear,  oropharynx clear, ++ postnasal drip Bilateral nasal purulence  Neck: No JVD, no TMG, no carotid bruits  Lungs: No use of accessory muscles, no dullness to percussion,distant BS  Cardiovascular: RRR, heart sounds normal, no murmur or gallops, 3+  peripheral edema  Abdomen: soft and NT, no HSM,  BS normal  Musculoskeletal: No deformities, no cyanosis or clubbing  Neuro: alert, non focal  Skin: Warm, no lesions or rashes       Assessment & Plan:   [name] Copd with small airways disease Gold C. COPD with small airways disease and now acute on chronic sinusitis Plan Take doxycycline 100mg  twice daily for 7days No change in inhaled medications Use omnaris two puff ea nostril daily until samples gone Return 3 months     Updated Medication List Outpatient Encounter Prescriptions as of 01/20/2013  Medication Sig  . albuterol (PROVENTIL HFA;VENTOLIN HFA) 108 (90 BASE) MCG/ACT inhaler Inhale 2 puffs into the lungs every 6 (six) hours as needed. wheezing   . albuterol (PROVENTIL) (2.5 MG/3ML) 0.083% nebulizer solution Take 2.5 mg by nebulization every 6 (six) hours as needed.  . ALPRAZolam (XANAX) 1 MG tablet Take 1 mg by mouth 2 (two) times daily as needed for anxiety.   Marland Kitchen amLODipine (NORVASC) 10 MG tablet Take 10 mg by mouth daily.  Marland Kitchen aspirin 325 MG tablet Take 325 mg by mouth daily.  . cloNIDine (CATAPRES) 0.3 MG tablet Take 1 tablet (0.3 mg total) by mouth 3 (three) times daily.  . Fluticasone-Salmeterol (ADVAIR) 250-50 MCG/DOSE AEPB Inhale 1 puff into the lungs 2 (two) times daily.  . furosemide  (LASIX) 40 MG tablet Take 80 mg by mouth 2 (two) times daily.  . hydrALAZINE (APRESOLINE) 100 MG tablet Take 100 mg by mouth 3 (three) times daily.  Marland Kitchen HYDROcodone-acetaminophen (VICODIN) 5-500 MG per tablet Take 1 tablet by mouth every 6 (six) hours as needed.  Marland Kitchen losartan (COZAAR) 100  MG tablet Take 100 mg by mouth daily.   . metoprolol (LOPRESSOR) 50 MG tablet Take 150 mg by mouth 3 (three) times daily.   . pantoprazole (PROTONIX) 40 MG tablet Take 1 tablet (40 mg total) by mouth daily.  . potassium chloride (K-DUR) 10 MEQ tablet Take 10 mEq by mouth daily.  Marland Kitchen tiotropium (SPIRIVA) 18 MCG inhalation capsule Place 18 mcg into inhaler and inhale daily as needed.  . traZODone (DESYREL) 100 MG tablet Take 100 mg by mouth at bedtime.   . ciclesonide (OMNARIS) 50 MCG/ACT nasal spray Place 2 sprays into both nostrils daily.  . cyclobenzaprine (FLEXERIL) 10 MG tablet Take 1 tablet (10 mg total) by mouth 3 (three) times daily as needed for muscle spasms.  Marland Kitchen doxycycline (DORYX) 100 MG EC tablet Take 1 tablet (100 mg total) by mouth 2 (two) times daily.

## 2013-01-20 NOTE — Patient Instructions (Addendum)
Take doxycycline 100mg  twice daily for 7days No change in inhaled medications Use omnaris two puff ea nostril daily until samples gone Return 3 months

## 2013-01-21 NOTE — Assessment & Plan Note (Signed)
Gold C. COPD with small airways disease and now acute on chronic sinusitis Plan Take doxycycline 100mg  twice daily for 7days No change in inhaled medications Use omnaris two puff ea nostril daily until samples gone Return 3 months

## 2013-01-28 ENCOUNTER — Telehealth: Payer: Self-pay | Admitting: Cardiology

## 2013-01-28 DIAGNOSIS — I10 Essential (primary) hypertension: Secondary | ICD-10-CM

## 2013-01-28 NOTE — Telephone Encounter (Signed)
To Dr Turner to advise 

## 2013-01-28 NOTE — Telephone Encounter (Signed)
New messages  BP readings:  174/126 ( highest) 158/98 ( lowest )  For one week these are the results.

## 2013-01-28 NOTE — Telephone Encounter (Signed)
Please add Doxazosin 2mg  qhs and have her check her BP daily for 1 week and call with the results.  Please order Renal duplex (doppler) to rule out renal artery stenosis.  Please order 24 hour urine for catecholamines, metanephrines, norepinephrine, epinephrine, VMA, dopamine, cortisol and aldosterone levels.

## 2013-01-31 MED ORDER — DOXAZOSIN MESYLATE 2 MG PO TABS: 2.0000 mg | ORAL_TABLET | Freq: Every day | ORAL | Status: DC

## 2013-01-31 NOTE — Telephone Encounter (Signed)
LVM for pt to return call

## 2013-01-31 NOTE — Telephone Encounter (Signed)
Spoke to pt and made aware. Called in pts Rx and ordered 24 hour urine and Renal Doppler. Unable to sign off on order for last Order sending to Mellody Dance so he can complete the order when pt brings urine sample back. Pt needs a VMA 24 hour urine added on. To Mellody Dance to make aware.

## 2013-02-02 IMAGING — CR DG CHEST 2V
2 series · 2 of 2 positions shown · non-contrast
Comparison: Portable chest x-ray 06/01/2011, 08/31/2010, and two-
view chest x-ray 05/12/2011, 05/03/2009.

CLINICAL DATA: Chest pain.  History of diabetes, hypertension,
COPD, and MI.

CHEST - 2 VIEW 06/09/2011:

[w chest pa]
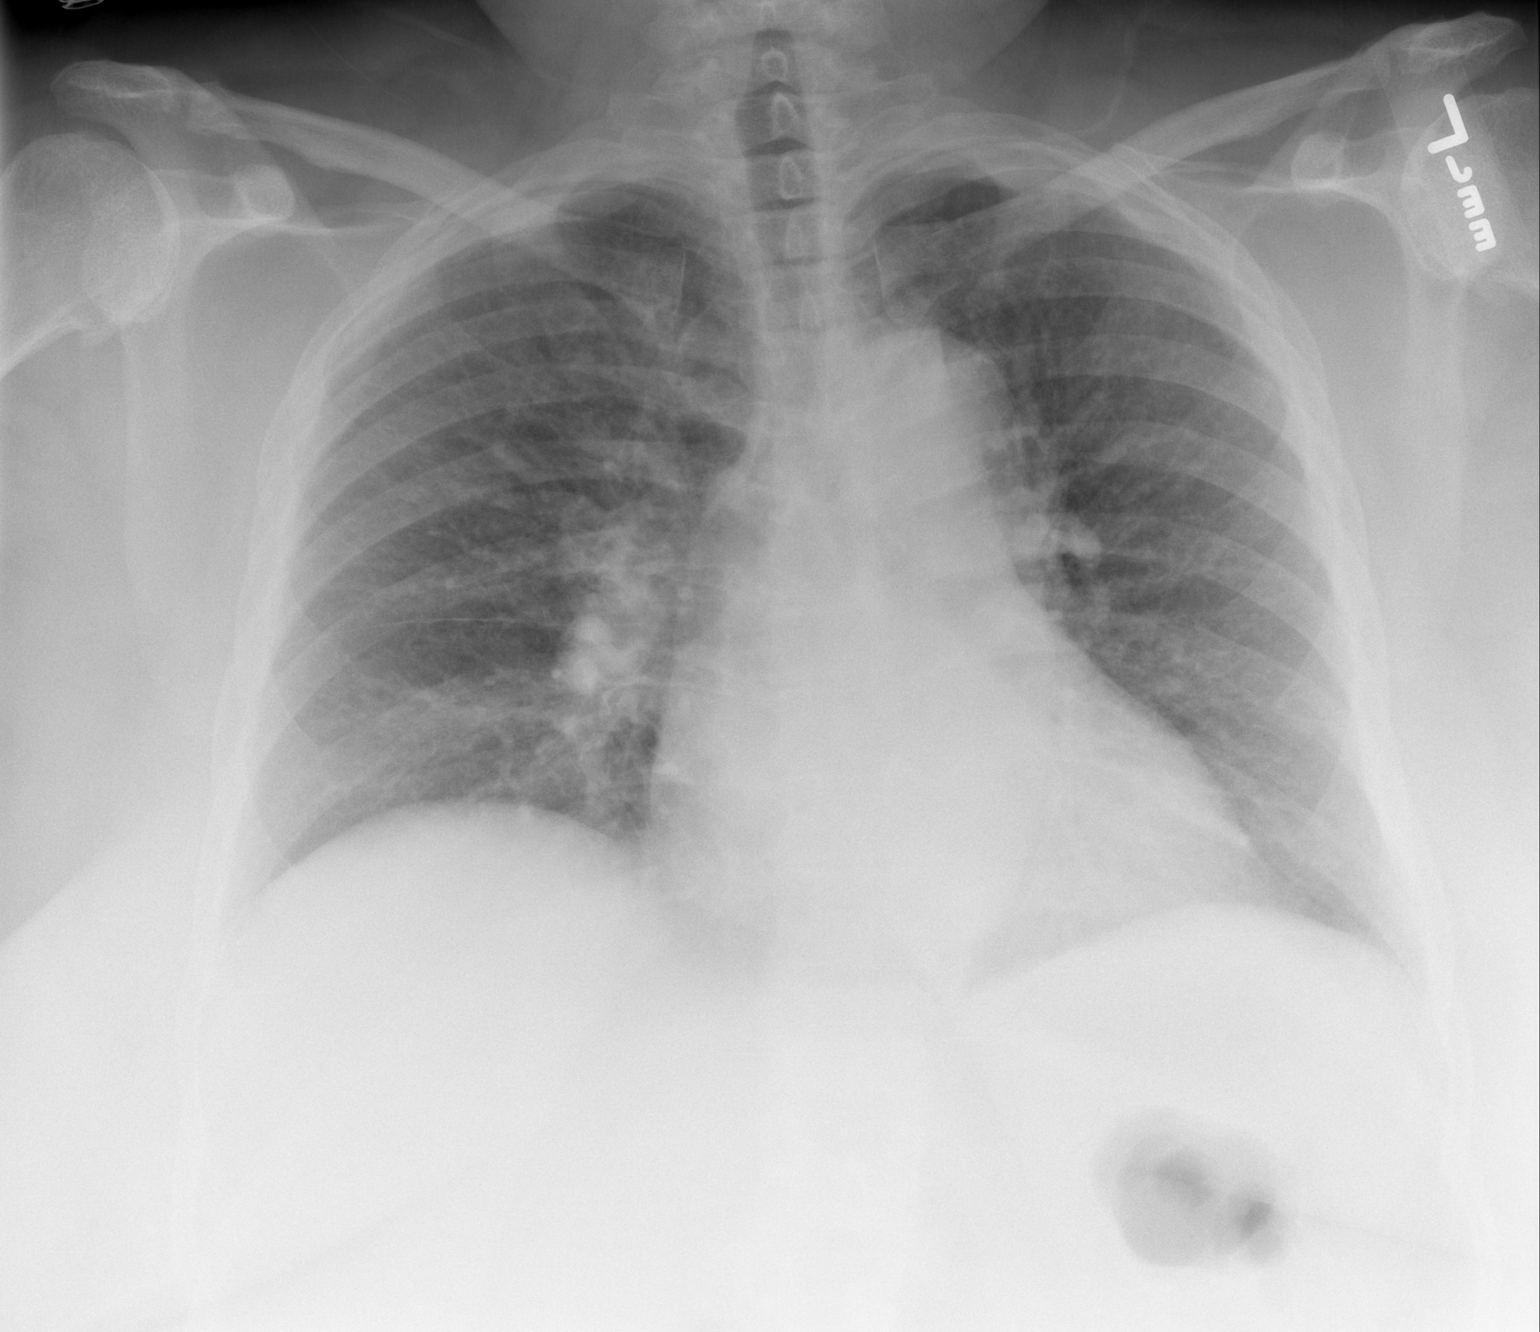

[w chest lat *]
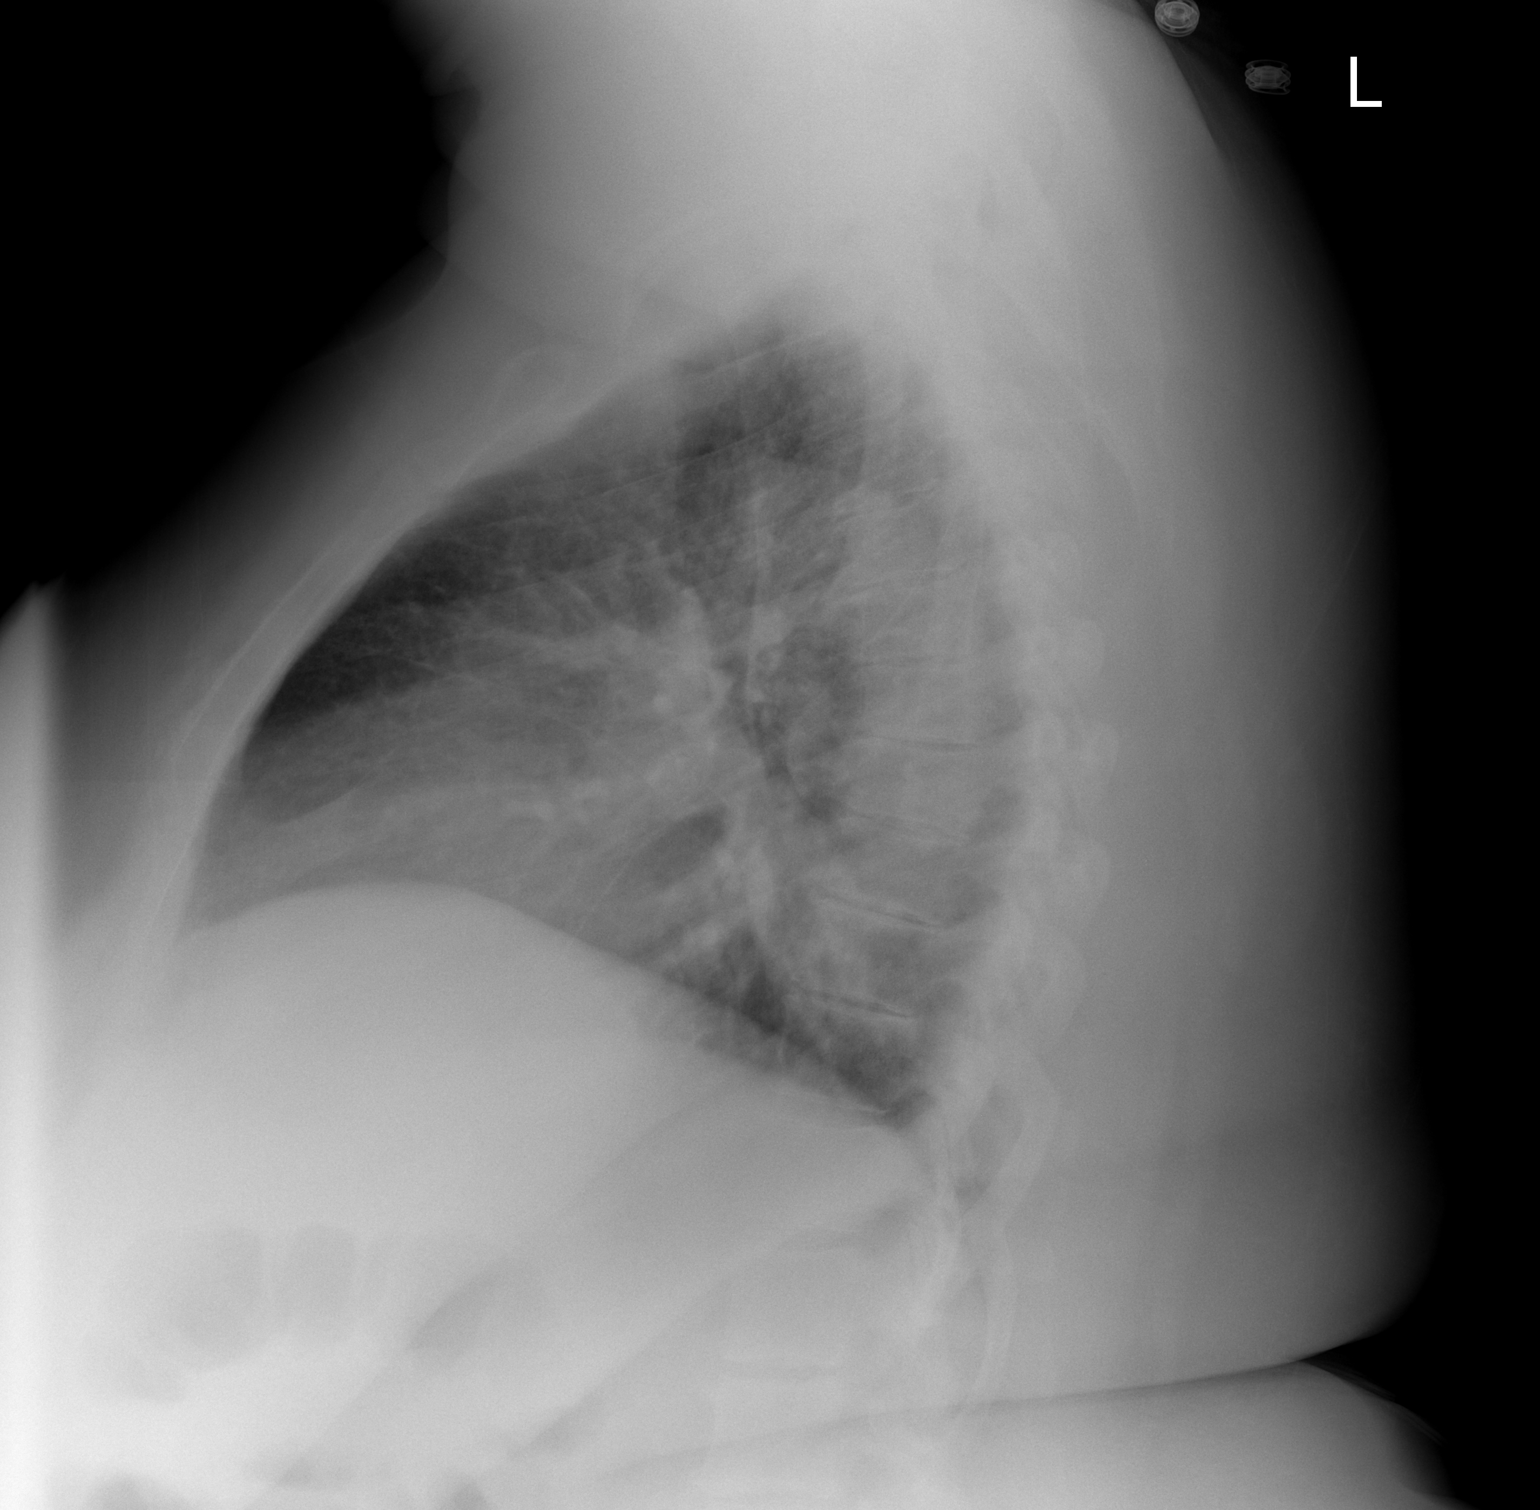

[2 of 2 positions shown; findings below may reference images not displayed]

FINDINGS: Suboptimal inspiration due to body habitus which accounts
for crowded bronchovascular markings at the bases and accentuates
the cardiac silhouette.  Taking this into account, cardiac
silhouette enlarged but stable, allowing for differences in
technique.  Thoracic aorta tortuous and atherosclerotic, unchanged.
Hilar and mediastinal contours otherwise unremarkable.  Lungs
clear.  Mildly prominent bronchovascular markings diffusely,
improved since the most recent prior examination.  No new pulmonary
parenchymal abnormalities.  No pleural effusions.  Degenerative
changes involving the thoracic spine.
IMPRESSION: Suboptimal inspiration.  Stable cardiomegaly and chronic
bronchitis/asthma.  No acute cardiopulmonary disease.

## 2013-02-13 ENCOUNTER — Encounter (HOSPITAL_COMMUNITY): Payer: Medicaid Other

## 2013-02-13 ENCOUNTER — Other Ambulatory Visit: Payer: Medicaid Other

## 2013-02-18 ENCOUNTER — Encounter (HOSPITAL_COMMUNITY): Payer: Medicaid Other

## 2013-02-25 ENCOUNTER — Telehealth: Payer: Self-pay | Admitting: Critical Care Medicine

## 2013-02-25 MED ORDER — DOXYCYCLINE HYCLATE 100 MG PO TBEC: 100.0000 mg | DELAYED_RELEASE_TABLET | Freq: Two times a day (BID) | ORAL | Status: DC

## 2013-02-25 NOTE — Telephone Encounter (Signed)
Rx doxycycline 100mg  bid x 7days OV if unimproving

## 2013-02-25 NOTE — Telephone Encounter (Signed)
Spoke with pt. She is aware of recs. RX has been sent. Nothing further needed

## 2013-02-25 NOTE — Telephone Encounter (Signed)
I called and spoke with pt. She c/o prod cough w/ brown-yellow phlem, chest congestion  X 1 week. denies any wheezing, no chest tx, no fever, no chills, no sweats, no PND, no body aches. She has not tried anything OTC. She did not want to come in for OV. Pt reports doxycycline always clears her up. Please advise PW thanks  No Known Allergies

## 2013-02-26 ENCOUNTER — Other Ambulatory Visit: Payer: Medicaid Other

## 2013-02-26 ENCOUNTER — Ambulatory Visit (HOSPITAL_COMMUNITY): Payer: Medicaid Other | Attending: Cardiology

## 2013-02-26 DIAGNOSIS — J449 Chronic obstructive pulmonary disease, unspecified: Secondary | ICD-10-CM | POA: Insufficient documentation

## 2013-02-26 DIAGNOSIS — R109 Unspecified abdominal pain: Secondary | ICD-10-CM | POA: Insufficient documentation

## 2013-02-26 DIAGNOSIS — I10 Essential (primary) hypertension: Secondary | ICD-10-CM | POA: Insufficient documentation

## 2013-02-26 DIAGNOSIS — J4489 Other specified chronic obstructive pulmonary disease: Secondary | ICD-10-CM | POA: Insufficient documentation

## 2013-03-19 ENCOUNTER — Encounter (HOSPITAL_COMMUNITY): Payer: Self-pay | Admitting: *Deleted

## 2013-03-19 ENCOUNTER — Inpatient Hospital Stay (HOSPITAL_COMMUNITY)
Admission: AD | Admit: 2013-03-19 | Discharge: 2013-03-20 | Disposition: A | Discharge status: Home / Self Care | Payer: Medicaid Other | Source: Ambulatory Visit | Attending: Obstetrics | Admitting: Obstetrics

## 2013-03-19 ENCOUNTER — Inpatient Hospital Stay (HOSPITAL_COMMUNITY): Payer: Medicaid Other

## 2013-03-19 DIAGNOSIS — N83209 Unspecified ovarian cyst, unspecified side: Secondary | ICD-10-CM | POA: Insufficient documentation

## 2013-03-19 DIAGNOSIS — J4489 Other specified chronic obstructive pulmonary disease: Secondary | ICD-10-CM | POA: Insufficient documentation

## 2013-03-19 DIAGNOSIS — Z87891 Personal history of nicotine dependence: Secondary | ICD-10-CM | POA: Insufficient documentation

## 2013-03-19 DIAGNOSIS — D259 Leiomyoma of uterus, unspecified: Secondary | ICD-10-CM | POA: Insufficient documentation

## 2013-03-19 DIAGNOSIS — J449 Chronic obstructive pulmonary disease, unspecified: Secondary | ICD-10-CM | POA: Insufficient documentation

## 2013-03-19 DIAGNOSIS — N8111 Cystocele, midline: Secondary | ICD-10-CM | POA: Insufficient documentation

## 2013-03-19 DIAGNOSIS — IMO0002 Reserved for concepts with insufficient information to code with codable children: Secondary | ICD-10-CM

## 2013-03-19 DIAGNOSIS — N949 Unspecified condition associated with female genital organs and menstrual cycle: Secondary | ICD-10-CM | POA: Insufficient documentation

## 2013-03-19 LAB — URINALYSIS, ROUTINE W REFLEX MICROSCOPIC
Bilirubin Urine: NEGATIVE
Glucose, UA: NEGATIVE mg/dL
Hgb urine dipstick: NEGATIVE
Ketones, ur: 15 mg/dL — AB
LEUKOCYTES UA: NEGATIVE
NITRITE: NEGATIVE
Protein, ur: NEGATIVE mg/dL
Specific Gravity, Urine: 1.025 (ref 1.005–1.030)
Urobilinogen, UA: 0.2 mg/dL (ref 0.0–1.0)
pH: 5.5 (ref 5.0–8.0)

## 2013-03-19 LAB — POCT PREGNANCY, URINE: Preg Test, Ur: NEGATIVE

## 2013-03-19 MED ORDER — HYDROMORPHONE HCL 2 MG PO TABS
2.0000 mg | ORAL_TABLET | Freq: Once | ORAL | Status: AC
Start: 2013-03-19 — End: 2013-03-20
  Administered 2013-03-20: 2 mg via ORAL
  Filled 2013-03-19: qty 1

## 2013-03-19 MED ORDER — OXYCODONE-ACETAMINOPHEN 5-325 MG PO TABS
2.0000 | ORAL_TABLET | Freq: Once | ORAL | Status: DC
  Filled 2013-03-19: qty 2

## 2013-03-19 NOTE — MAU Note (Signed)
Pt reports she started having rectal pressure about one year ago, was seen by [facility] and eventually referred to Dr Ruthann Cancer. Pain has progressed to lower abd, pelvis. States she feels like everything is going to fall out. Was checked for prolapse, STD's, bladder infection but all test came back negative. States pressure is worsening.

## 2013-03-19 NOTE — MAU Provider Note (Addendum)
Chief Complaint: Pelvic Pain  First Provider Initiated Contact with Patient 03/19/13 2042     SUBJECTIVE HPI: Jenna Chandler is a 48 y.o. [id number] female who presents with worsening pelvic pressure that started about one year ago. Rates pain/pressure 7/10 on pain scale. Extremely disruptive to her daily life. The pressure was originally greater toward her rectum. but now feels as if something the size of a grapefruit is falling through her vagina. Was referred to Dr. Ruthann Cancer and (per patient) had tests for bladder infection, STDs and ? Hysteroscopy with no explanation for her pressure identified. Pain is greatest with standing. Improved with lying down. Had a leftover prescription for Vicodin and has needed to use that on occasion for pain and pressure. Only experienced mild improvement in her symptoms.   Patient has regular menstrual cycles every 28 days with normal flow. LMP 03/09/2013. Patient is not sexually active. Reports occasional constipation. Originally thought her symptoms were related to the constipation and tried frequent laxative use, but had no improvement in the pressure despite having extremely frequent soft bowel movements. Stopped laxative use. Treats occasional constipation successfully with prune juice.   Past Medical History  Diagnosis Date  . Asthma   . Hypertension   . COPD (chronic obstructive pulmonary disease)     +/- asthma   . Obesity   . Manic, depressive   . Enlarged heart   . Fibromyalgia    OB History  Gravida Para Term Preterm AB SAB TAB Ectopic Multiple Living  1 1 1       1     # Outcome Date GA Lbr Len/2nd Weight Sex Delivery Anes PTL Lv  1 TRM 10/19/78    F SVD   Y     History reviewed. No pertinent past surgical history. History   Social History  . Marital Status: Single    Spouse Name: N/A    Number of Children: N/A  . Years of Education: N/A   Occupational History  . Not on file.   Social History Main Topics  . Smoking status: Former  Smoker -- 2.00 packs/day for 15 years    Types: Cigarettes    Quit date: 05/24/2002  . Smokeless tobacco: Never Used  . Alcohol Use: No  . Drug Use: No  . Sexual Activity: Not Currently    Birth Control/ Protection: Abstinence   Other Topics Concern  . Not on file   Social History Narrative  . No narrative on file   No current facility-administered medications on file prior to encounter.   Current Outpatient Prescriptions on File Prior to Encounter  Medication Sig Dispense Refill  . albuterol (PROVENTIL HFA;VENTOLIN HFA) 108 (90 BASE) MCG/ACT inhaler Inhale 2 puffs into the lungs every 6 (six) hours as needed. wheezing       . albuterol (PROVENTIL) (2.5 MG/3ML) 0.083% nebulizer solution Take 2.5 mg by nebulization every 6 (six) hours as needed.      . ALPRAZolam (XANAX) 1 MG tablet Take 1 mg by mouth 2 (two) times daily as needed for anxiety.       Marland Kitchen amLODipine (NORVASC) 10 MG tablet Take 10 mg by mouth daily.      Marland Kitchen aspirin 325 MG tablet Take 325 mg by mouth daily.      . cloNIDine (CATAPRES) 0.3 MG tablet Take 1 tablet (0.3 mg total) by mouth 3 (three) times daily.  90 tablet  11  . Fluticasone-Salmeterol (ADVAIR) 250-50 MCG/DOSE AEPB Inhale 1 puff into the lungs 2 (  two) times daily.  60 each  6  . furosemide (LASIX) 40 MG tablet Take 80 mg by mouth 2 (two) times daily.      . hydrALAZINE (APRESOLINE) 100 MG tablet Take 100 mg by mouth 3 (three) times daily.      Marland Kitchen HYDROcodone-acetaminophen (VICODIN) 5-500 MG per tablet Take 1 tablet by mouth every 6 (six) hours as needed.      . metoprolol (LOPRESSOR) 50 MG tablet Take 150 mg by mouth 3 (three) times daily.       . pantoprazole (PROTONIX) 40 MG tablet Take 1 tablet (40 mg total) by mouth daily.  30 tablet  0  . tiotropium (SPIRIVA) 18 MCG inhalation capsule Place 18 mcg into inhaler and inhale daily as needed.      . traZODone (DESYREL) 100 MG tablet Take 100 mg by mouth at bedtime.        No Known Allergies  ROS: Pertinent  positive items in HPI. Reports occasional leakage of urine, but attributes it to delaying getting out of the bathroom because she dreads the pelvic pressure that accompanies walking. Denies fever, chills, dysuria, urgency, frequency, hematuria, stress incontinence, flank pain, nausea, vomiting, diarrhea or presence of a vaginal or rectal mass.  OBJECTIVE Blood pressure 136/85, pulse 62, temperature 97.7 F (36.5 C), temperature source Oral, resp. rate 20, height 5\' 4"  (1.626 m), weight 187.336 kg (413 lb), last menstrual period 03/09/2013, SpO2 100.00%. GENERAL: Well-developed, well-nourished, morbidly obese female in mild distress.  HEENT: Normocephalic HEART: normal rate RESP: normal effort ABDOMEN: Soft, mild left lower quadrant tenderness. Positive bowel sounds. No CVA tenderness. EXTREMITIES: Nontender, no edema NEURO: Alert and oriented SPECULUM EXAM: NEFG, physiologic discharge, no blood noted, cervix clean.  Posterior plate of speculum reinserted for better evaluation of anterior and posterior vaginal walls. Stage II cystocele noted w/ valsalva.  BIMANUAL: cervix closed; unable to assess uterine size due to body habitus, no adnexal tenderness or masses. Cervical motion tenderness.  LAB RESULTS Results for orders placed during the hospital encounter of 03/19/13 (from the past 24 hour(s))  URINALYSIS, ROUTINE W REFLEX MICROSCOPIC     Status: Abnormal   Collection Time    03/19/13  8:14 PM      Result Value Ref Range   Color, Urine YELLOW  YELLOW   APPearance CLEAR  CLEAR   Specific Gravity, Urine 1.025  1.005 - 1.030   pH 5.5  5.0 - 8.0   Glucose, UA NEGATIVE  NEGATIVE mg/dL   Hgb urine dipstick NEGATIVE  NEGATIVE   Bilirubin Urine NEGATIVE  NEGATIVE   Ketones, ur 15 (*) NEGATIVE mg/dL   Protein, ur NEGATIVE  NEGATIVE mg/dL   Urobilinogen, UA 0.2  0.0 - 1.0 mg/dL   Nitrite NEGATIVE  NEGATIVE   Leukocytes, UA NEGATIVE  NEGATIVE  POCT PREGNANCY, URINE     Status: None    Collection Time    03/19/13  8:21 PM      Result Value Ref Range   Preg Test, Ur NEGATIVE  NEGATIVE    IMAGING US Transvaginal Non-ob  03/19/2013   CLINICAL DATA:  Pelvic pain and pressure.  EXAM: TRANSABDOMINAL AND TRANSVAGINAL ULTRASOUND OF PELVIS  TECHNIQUE: Both transabdominal and transvaginal ultrasound examinations of the pelvis were performed. Transabdominal technique was performed for global imaging of the pelvis including uterus, ovaries, adnexal regions, and pelvic cul-de-sac. It was necessary to proceed with endovaginal exam following the transabdominal exam to visualize the uterus, ovaries and adnexal structures.  COMPARISON:  US TRANSVAGINAL NON-OB dated 05/23/2012; US ABDOMEN COMPLETE dated 05/23/2012; US PELVIS COMPLETE dated 05/23/2012  FINDINGS: Uterus  Measurements: 10.1 cm x 5.0 cm x 5.0 cm. Echogenic mass in the anterior fundus likely represents uterine fibroid.  Endometrium  Thickness: 5 mm, normal.  No focal abnormality visualized.  Right ovary  Measurements: The ovaries are difficult to visualize because of obese body habitus. Candidate for the right ovary measures 27 mm x 18 mm x 13 mm. Normal appearance/no adnexal mass.  Left ovary  Measurements: 35 mm x 18 mm x 21 mm. Normal appearance/no adnexal mass.  Other findings  Right adnexal cyst measures 24 mm x 16 mm x 26 mm. This may represent an exophytic ovarian cyst or paraovarian cyst. This was not described on the prior exam.  IMPRESSION: 1. No acute abnormality. 2. Fibroid uterus. 3. Exam degraded by obese body habitus. 4. Right adnexal cystic structure may represent an exophytic ovarian cyst or paraovarian cyst. No follow-up is necessary.   Electronically Signed   By: Dereck Ligas M.D.   On: 03/19/2013 22:43   US Pelvis Complete  03/19/2013   CLINICAL DATA:  Pelvic pain and pressure.  EXAM: TRANSABDOMINAL AND TRANSVAGINAL ULTRASOUND OF PELVIS  TECHNIQUE: Both transabdominal and transvaginal ultrasound examinations of the pelvis  were performed. Transabdominal technique was performed for global imaging of the pelvis including uterus, ovaries, adnexal regions, and pelvic cul-de-sac. It was necessary to proceed with endovaginal exam following the transabdominal exam to visualize the uterus, ovaries and adnexal structures.  COMPARISON:  US TRANSVAGINAL NON-OB dated 05/23/2012; US ABDOMEN COMPLETE dated 05/23/2012; US PELVIS COMPLETE dated 05/23/2012  FINDINGS: Uterus  Measurements: 10.1 cm x 5.0 cm x 5.0 cm. Echogenic mass in the anterior fundus likely represents uterine fibroid.  Endometrium  Thickness: 5 mm, normal.  No focal abnormality visualized.  Right ovary  Measurements: The ovaries are difficult to visualize because of obese body habitus. Candidate for the right ovary measures 27 mm x 18 mm x 13 mm. Normal appearance/no adnexal mass.  Left ovary  Measurements: 35 mm x 18 mm x 21 mm. Normal appearance/no adnexal mass.  Other findings  Right adnexal cyst measures 24 mm x 16 mm x 26 mm. This may represent an exophytic ovarian cyst or paraovarian cyst. This was not described on the prior exam.  IMPRESSION: 1. No acute abnormality. 2. Fibroid uterus. 3. Exam degraded by obese body habitus. 4. Right adnexal cystic structure may represent an exophytic ovarian cyst or paraovarian cyst. No follow-up is necessary.   Electronically Signed   By: Dereck Ligas M.D.   On: 03/19/2013 22:43   MAU COURSE Pain improved with Dilaudid.  Discussed findings of fibroid, ovarian cyst and cystocele. Pressure could be caused by any of these findings. Will need office followup for complete evaluation and discussion of treatment. Patient wants to find a new gynecologist.  ASSESSMENT 1. Fibroid uterus   2. Cystocele    PLAN Discharge home in stable condition. Patient handouts given. Follow-up Information   Follow up with [facility]. (will call you to schedule appointment)    Specialty:  Obstetrics and Gynecology   Contact information:    [place] [address] [phone]      Follow up with [facility]. (As needed in emergencies)    Contact information:   [address] [id number] [place] [address] [phone]        Medication List  ASK your doctor about these medications       albuterol 108 (90 BASE) MCG/ACT inhaler  Commonly known as:  PROVENTIL HFA;VENTOLIN HFA  Inhale 2 puffs into the lungs every 6 (six) hours as needed. wheezing     albuterol (2.5 MG/3ML) 0.083% nebulizer solution  Commonly known as:  PROVENTIL  Take 2.5 mg by nebulization every 6 (six) hours as needed.     ALPRAZolam 1 MG tablet  Commonly known as:  XANAX  Take 1 mg by mouth 2 (two) times daily as needed for anxiety.     amLODipine 10 MG tablet  Commonly known as:  NORVASC  Take 10 mg by mouth daily.     aspirin 325 MG tablet  Take 325 mg by mouth daily.     cloNIDine 0.3 MG tablet  Commonly known as:  CATAPRES  Take 1 tablet (0.3 mg total) by mouth 3 (three) times daily.     Fluticasone-Salmeterol 250-50 MCG/DOSE Aepb  Commonly known as:  ADVAIR  Inhale 1 puff into the lungs 2 (two) times daily.     furosemide 40 MG tablet  Commonly known as:  LASIX  Take 80 mg by mouth 2 (two) times daily.     hydrALAZINE 100 MG tablet  Commonly known as:  APRESOLINE  Take 100 mg by mouth 3 (three) times daily.     HYDROcodone-acetaminophen 5-500 MG per tablet  Commonly known as:  VICODIN  Take 1 tablet by mouth every 6 (six) hours as needed.     metoprolol 50 MG tablet  Commonly known as:  LOPRESSOR  Take 150 mg by mouth 3 (three) times daily.     pantoprazole 40 MG tablet  Commonly known as:  PROTONIX  Take 1 tablet (40 mg total) by mouth daily.     tiotropium 18 MCG inhalation capsule  Commonly known as:  SPIRIVA  Place 18 mcg into inhaler and inhale daily as needed.     traZODone 100 MG tablet  Commonly known as:  DESYREL  Take 100 mg  by mouth at bedtime.       [place], CNM 03/19/2013  8:40 PM

## 2013-03-20 DIAGNOSIS — D259 Leiomyoma of uterus, unspecified: Secondary | ICD-10-CM

## 2013-03-20 MED ORDER — HYDROMORPHONE HCL 2 MG PO TABS: 2.0000 mg | ORAL_TABLET | ORAL | Status: DC | PRN

## 2013-03-20 NOTE — MAU Note (Deleted)
Not in lobby

## 2013-03-20 NOTE — MAU Provider Note (Signed)
Attestation of Attending Supervision of Advanced Practitioner (CNM/NP): Evaluation and management procedures were performed by the Advanced Practitioner under my supervision and collaboration.  I have reviewed the Advanced Practitioner's note and chart, and I agree with the management and plan.  HARRAWAY-SMITH, Chivonne Rascon 7:33 AM

## 2013-03-20 NOTE — MAU Provider Note (Signed)
Attestation of Attending Supervision of Advanced Practitioner (CNM/NP): Evaluation and management procedures were performed by the Advanced Practitioner under my supervision and collaboration.  I have reviewed the Advanced Practitioner's note and chart, and I agree with the management and plan.  HARRAWAY-SMITH, Maksim Peregoy 6:44 AM

## 2013-03-20 NOTE — Discharge Instructions (Signed)
Uterine Fibroid A uterine fibroid is a growth (tumor) that occurs in your uterus. This type of tumor is not cancerous and does not spread out of the uterus. You can have one or many fibroids. Fibroids can vary in size, weight, and where they grow in the uterus. Some can become quite large. Most fibroids do not require medical treatment, but some can cause pain or heavy bleeding during and between periods. CAUSES  A fibroid is the result of a single uterine cell that keeps growing (unregulated), which is different than most cells in the human body. Most cells have a control mechanism that keeps them from reproducing without control.  SIGNS AND SYMPTOMS   Bleeding.  Pelvic pain and pressure.  Bladder problems due to the size of the fibroid.  Infertility and miscarriages depending on the size and location of the fibroid. DIAGNOSIS  Uterine fibroids are diagnosed through a physical exam. Your health care provider may feel the lumpy tumors during a pelvic exam. Ultrasonography may be done to get information regarding size, location, and number of tumors.  TREATMENT   Your health care provider may recommend watchful waiting. This involves getting the fibroid checked by your health care provider to see if it grows or shrinks.   Hormone treatment or an intrauterine device (IUD) may be prescribed.   Surgery may be needed to remove the fibroids (myomectomy) or the uterus (hysterectomy). This depends on your situation. When fibroids interfere with fertility and a woman wants to become pregnant, a health care provider may recommend having the fibroids removed.  HOME CARE INSTRUCTIONS  Home care depends on how you were treated. In general:   Keep all follow-up appointments with your health care provider.   Only take over-the-counter or prescription medicines as directed by your health care provider. If you were prescribed a hormone treatment, take the hormone medicines exactly as directed. Do not  take aspirin. It can cause bleeding.   Talk to your health care provider about taking iron pills.  If your periods are troublesome but not so heavy, lie down with your feet raised slightly above your heart. Place cold packs on your lower abdomen.   If your periods are heavy, write down the number of pads or tampons you use per month. Bring this information to your health care provider.   Include green vegetables in your diet.  SEEK IMMEDIATE MEDICAL CARE IF:  You have pelvic pain or cramps not controlled with medicines.   You have a sudden increase in pelvic pain.   You have an increase in bleeding between and during periods.   You have excessive periods and soak tampons or pads in a half hour or less.  You feel lightheaded or have fainting episodes. Document Released: 01/21/2000 Document Revised: 11/13/2012 Document Reviewed: 08/22/2012 Exit[employer] Patient Information 2014 [employer].  

## 2013-03-25 ENCOUNTER — Telehealth: Payer: Self-pay | Admitting: General Practice

## 2013-03-25 NOTE — Telephone Encounter (Signed)
Called patient back and she stated that she was taking the dilaudid every 4 hours as prescribed because she didn't have anything else and that the pressure is really intense but it is painful as well. Discussed with patient that Dr Hulan Fray is one of our best physicians to handle cytocele's and she will take good care of her and unfortunately there isn't anything we can prescribe for the pressure but that she could take ibuprofen 600mg  every 8 hours if she needed it. Also recommended warm epsom salt tub baths or heating pad as well. Patient verbalized understanding and had no further questions

## 2013-03-25 NOTE — Telephone Encounter (Signed)
Patient called and left message stating she has been having severe pain and was seen in the ER recently and got a Rx for pain medication (dilaudid) and knows she is going to need a refill because she has been hurting and doesn't have an appt with [place] till 3/2

## 2013-04-07 ENCOUNTER — Ambulatory Visit (INDEPENDENT_AMBULATORY_CARE_PROVIDER_SITE_OTHER): Payer: Medicaid Other | Admitting: Obstetrics & Gynecology

## 2013-04-07 ENCOUNTER — Encounter: Payer: Self-pay | Admitting: Obstetrics & Gynecology

## 2013-04-07 ENCOUNTER — Other Ambulatory Visit (HOSPITAL_COMMUNITY)
Admission: RE | Admit: 2013-04-07 | Discharge: 2013-04-07 | Disposition: A | Discharge status: Home / Self Care | Payer: Medicaid Other | Source: Ambulatory Visit | Attending: Obstetrics & Gynecology | Admitting: Obstetrics & Gynecology

## 2013-04-07 VITALS — BP 147/82 | HR 72 | Temp 97.9°F | Ht 64.0 in | Wt >= 6400 oz

## 2013-04-07 DIAGNOSIS — Z01419 Encounter for gynecological examination (general) (routine) without abnormal findings: Secondary | ICD-10-CM | POA: Insufficient documentation

## 2013-04-07 DIAGNOSIS — Z0000 Encounter for general adult medical examination without abnormal findings: Secondary | ICD-10-CM

## 2013-04-07 DIAGNOSIS — D219 Benign neoplasm of connective and other soft tissue, unspecified: Secondary | ICD-10-CM

## 2013-04-07 DIAGNOSIS — D259 Leiomyoma of uterus, unspecified: Secondary | ICD-10-CM

## 2013-04-07 DIAGNOSIS — Z1151 Encounter for screening for human papillomavirus (HPV): Secondary | ICD-10-CM | POA: Insufficient documentation

## 2013-04-07 NOTE — Addendum Note (Signed)
Addended by: Shelly Coss on: 04/07/2013 04:31 PM   Modules accepted: Orders

## 2013-04-07 NOTE — Progress Notes (Signed)
   Subjective:    Patient ID: Jenna Chandler, female    DOB: 1965-05-20, 48 y.o.   MRN: [id number]  HPI  74 S AA P1 here today for evaluation of pelvic pain and a 10 cm fibroid seen on u/s recently. She says that the pain radiates to her rectum. Dr. Ruthann Cancer refused to operate on her. She says that he told her that she was "too fat". She reports that her periods last 4-5 days, not particularly heavy. She is not sexually active.  Review of Systems Last pap smear 9/12 Last mammogram 4/14    Objective:   Physical Exam Minimal cystocele Normal appearing cervix Bimanual exam not helpful due to the extreme amount of obesity       Assessment & Plan:  Preventative care- pap smear done today Fibroid with rectal/pelvic pain. I have told her that she runs a very high risk of surgical complications with a hysterectomy. I recommended that she go to a bariatric center and lose weight prior to surgery. I have clearly told her that she cannot expect to receive narcotics at this clinic. I have offered a referral to the pelvic pain clinic prn.  She declines a flu vaccine today.

## 2013-04-17 ENCOUNTER — Encounter: Payer: Self-pay | Admitting: Cardiology

## 2013-04-22 ENCOUNTER — Ambulatory Visit (INDEPENDENT_AMBULATORY_CARE_PROVIDER_SITE_OTHER): Payer: Medicaid Other | Admitting: Cardiology

## 2013-04-22 ENCOUNTER — Encounter: Payer: Self-pay | Admitting: Cardiology

## 2013-04-22 VITALS — BP 150/84 | HR 78 | Ht 64.0 in

## 2013-04-22 DIAGNOSIS — I509 Heart failure, unspecified: Secondary | ICD-10-CM

## 2013-04-22 DIAGNOSIS — I10 Essential (primary) hypertension: Secondary | ICD-10-CM

## 2013-04-22 DIAGNOSIS — G4733 Obstructive sleep apnea (adult) (pediatric): Secondary | ICD-10-CM

## 2013-04-22 DIAGNOSIS — I5032 Chronic diastolic (congestive) heart failure: Secondary | ICD-10-CM

## 2013-04-22 MED ORDER — LOSARTAN POTASSIUM 100 MG PO TABS: 100.0000 mg | ORAL_TABLET | Freq: Every day | ORAL | Status: DC

## 2013-04-22 NOTE — Progress Notes (Signed)
[facility], [place], [place]  [address] Phone: [phone] Fax:  [phone]  Date:  04/22/2013   ID:  Jenna Chandler, DOB Jul 02, 1965, MRN [id number]  PCP:  Philis Fendt, MD  Cardiologist:  Fransico Him, MD     History of Present Illness: Jenna Chandler is a 48 y.o. female with a history of chronic diastolic CHF, HTN, morbid obesity and OSA on CPAP who presents for followup.  She is doing well.  She denies any chest pain, SOB, DOE, LE edema, dizziness, palpitations or syncope.  She is back for HTN eval.  Unfortunately her BP is elevated today because she ran out of her Losartan and never got is refilled.   Wt Readings from Last 3 Encounters:  04/07/13 406 lb 9.6 oz (184.433 kg)  03/19/13 413 lb (187.336 kg)  01/20/13 407 lb (184.614 kg)     Past Medical History  Diagnosis Date  . Asthma   . Hypertension   . COPD (chronic obstructive pulmonary disease)     +/- asthma   . Obesity   . Manic, depressive   . Enlarged heart   . Fibromyalgia   . Morbid obesity     Current Outpatient Prescriptions  Medication Sig Dispense Refill  . albuterol (PROVENTIL HFA;VENTOLIN HFA) 108 (90 BASE) MCG/ACT inhaler Inhale 2 puffs into the lungs every 6 (six) hours as needed. wheezing       . albuterol (PROVENTIL) (2.5 MG/3ML) 0.083% nebulizer solution Take 2.5 mg by nebulization every 6 (six) hours as needed.      . ALPRAZolam (XANAX) 1 MG tablet Take 1 mg by mouth 2 (two) times daily as needed for anxiety.       Marland Kitchen amLODipine (NORVASC) 10 MG tablet Take 10 mg by mouth daily.      Marland Kitchen aspirin 325 MG tablet Take 325 mg by mouth daily.      . Biotin 1000 MCG tablet Take 1,000 mcg by mouth daily.      . cloNIDine (CATAPRES) 0.3 MG tablet Take 1 tablet (0.3 mg total) by mouth 3 (three) times daily.  90 tablet  11  . furosemide (LASIX) 40 MG tablet Take 80 mg by mouth 2 (two) times daily.      . hydrALAZINE (APRESOLINE) 100 MG tablet Take 100 mg by mouth 3 (three) times daily.        Marland Kitchen HYDROmorphone (DILAUDID) 2 MG tablet Take 1 tablet (2 mg total) by mouth every 4 (four) hours as needed for severe pain.  20 tablet  0  . metoprolol (LOPRESSOR) 50 MG tablet Take 150 mg by mouth 3 (three) times daily.       . pantoprazole (PROTONIX) 40 MG tablet Take 1 tablet (40 mg total) by mouth daily.  30 tablet  0  . tiotropium (SPIRIVA) 18 MCG inhalation capsule Place 18 mcg into inhaler and inhale daily as needed.      . traZODone (DESYREL) 100 MG tablet Take 100 mg by mouth at bedtime.        No current facility-administered medications for this visit.    Allergies:   No Known Allergies  Social History:  The patient  reports that she quit smoking about 10 years ago. Her smoking use included Cigarettes. She has a 30 pack-year smoking history. She has never used smokeless tobacco. She reports that she does not drink alcohol or use illicit drugs.   Family History:  The patient's family history includes Cancer in her mother.  ROS:  Please see the history of present illness.      All other systems reviewed and negative.   PHYSICAL EXAM: VS:  BP 150/84  Pulse 78  Ht 5\' 4"  (1.626 m)  LMP 03/31/2013 Well nourished, well developed, in no acute distress HEENT: normal Neck: no JVD Cardiac:  normal S1, S2; RRR; no murmur Lungs:  clear to auscultation bilaterally, no wheezing, rhonchi or rales Abd: soft, nontender, no hepatomegaly Ext: no edema Skin: warm and dry Neuro:  CNs 2-12 intact, no focal abnormalities noted       ASSESSMENT AND PLAN:  1. Chronic diastolic CHF - continue lasix 2.  HTN - borderline control.  At home it can get as high as [id number] systolic - continue Amlodipine/Clonidine/Hydralazine/metoprolol - Restart Losartan 100mg  daily - she ran out and never got the prescription refilled.   - recheck BP and BMET in 1 week with nurse visit.   3.   Morbid obesity 2. OSA on CPAP  Followup with me in 3 months  Signed, Fransico Him, MD 04/22/2013 4:07 PM

## 2013-04-22 NOTE — Patient Instructions (Addendum)
Your physician recommends that you continue on your current medications as directed. Please refer to the Current Medication list given to you today.  Your physician recommends that you schedule a follow-up appointment in: 3 Months with Dr Radford Pax

## 2013-04-24 ENCOUNTER — Telehealth: Payer: Self-pay | Admitting: *Deleted

## 2013-04-24 NOTE — Telephone Encounter (Signed)
PA for losartan potassium to MEDICAID.

## 2013-05-20 ENCOUNTER — Telehealth: Payer: Self-pay | Admitting: Cardiology

## 2013-05-20 ENCOUNTER — Other Ambulatory Visit: Payer: Self-pay | Admitting: General Surgery

## 2013-05-20 ENCOUNTER — Other Ambulatory Visit (HOSPITAL_COMMUNITY): Payer: Self-pay | Admitting: Internal Medicine

## 2013-05-20 DIAGNOSIS — Z79899 Other long term (current) drug therapy: Secondary | ICD-10-CM

## 2013-05-20 DIAGNOSIS — Z1231 Encounter for screening mammogram for malignant neoplasm of breast: Secondary | ICD-10-CM

## 2013-05-20 NOTE — Telephone Encounter (Signed)
New message    Saw Dr Radford Pax in march.  She said Dr Radford Pax wanted to check her kidney function.  However, no lab appt was scheduled.  Will Dr Radford Pax want to still do lab work?

## 2013-05-20 NOTE — Telephone Encounter (Signed)
Pt scheduled for 4/20 for BMET

## 2013-05-20 NOTE — Telephone Encounter (Signed)
Order already in system

## 2013-05-20 NOTE — Telephone Encounter (Signed)
LVM for pt to let us know what day she would like to come in for a BMET. I just need to know the day so I can put the pt on the lab schedule.

## 2013-05-21 ENCOUNTER — Other Ambulatory Visit: Payer: Self-pay | Admitting: Critical Care Medicine

## 2013-05-23 ENCOUNTER — Telehealth: Payer: Self-pay | Admitting: Critical Care Medicine

## 2013-05-23 MED ORDER — LEVOFLOXACIN 500 MG PO TABS: 500.0000 mg | ORAL_TABLET | Freq: Every day | ORAL | Status: DC

## 2013-05-23 NOTE — Telephone Encounter (Signed)
Called and spoke with the pt  She is c/o chest congestion and prod cough with large amounts of yellow to brown sputum  She states that she is no more SOB than usual  She denies and CP, wheezing, chest tightness, wheezing or fever  I advised she needed to come in for eval- as she had called back in Jan sick and we prescribed doxy  She states that she is unable to come in at all today and would like something called in  MR- please advise thanks! No Known Allergies Patient Active Problem List   Diagnosis Date Noted  . Chronic respiratory failure 04/26/2012  . OSA (obstructive sleep apnea) 06/02/2011  . Lethargy 06/02/2011  . Diastolic CHF, chronic 92/44/6286  . 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 HTN (hypertension) 05/11/2011  . Morbid obesity 05/11/2011  . Normocytic anemia 05/11/2011  . Manic, depressive

## 2013-05-23 NOTE — Telephone Encounter (Signed)
Pt aware of recs. RX called in. Appt scheduled for 06/24/13. Nothing further needed

## 2013-05-23 NOTE — Telephone Encounter (Signed)
SHe has some degree of  aecopd developing  Can start with antibiotics  - take levaquin 500mg  once daily  X 5 days  If gets worse,   - call us for steroids   Please give her first availble fu with me  Dr. Brand Males, M.D., [facility].C.P Pulmonary and Critical Care Medicine Staff Physician [facility] Pager: [phone], If no answer or between  15:00h - 7:00h: call [phone]  05/23/2013 12:20 PM

## 2013-05-26 ENCOUNTER — Other Ambulatory Visit (INDEPENDENT_AMBULATORY_CARE_PROVIDER_SITE_OTHER): Payer: Medicaid Other

## 2013-05-26 DIAGNOSIS — Z79899 Other long term (current) drug therapy: Secondary | ICD-10-CM

## 2013-05-26 LAB — BASIC METABOLIC PANEL
BUN: 7 mg/dL (ref 6–23)
CO2: 27 mEq/L (ref 19–32)
CREATININE: 0.6 mg/dL (ref 0.4–1.2)
Calcium: 9 mg/dL (ref 8.4–10.5)
Chloride: 103 mEq/L (ref 96–112)
GFR: 128.94 mL/min (ref >60.00)
Glucose, Bld: 90 mg/dL (ref 70–99)
POTASSIUM: 3.7 meq/L (ref 3.5–5.1)
Sodium: 142 mEq/L (ref 135–145)

## 2013-05-30 ENCOUNTER — Ambulatory Visit: Payer: Medicaid Other | Admitting: Critical Care Medicine

## 2013-06-03 ENCOUNTER — Ambulatory Visit: Payer: Medicaid Other | Admitting: Critical Care Medicine

## 2013-06-04 ENCOUNTER — Ambulatory Visit (HOSPITAL_COMMUNITY): Payer: Medicaid Other

## 2013-06-06 ENCOUNTER — Other Ambulatory Visit: Payer: Self-pay | Admitting: Cardiology

## 2013-06-11 ENCOUNTER — Ambulatory Visit (HOSPITAL_COMMUNITY)
Admission: RE | Admit: 2013-06-11 | Discharge: 2013-06-11 | Disposition: A | Discharge status: Home / Self Care | Payer: Medicaid Other | Source: Ambulatory Visit | Attending: Internal Medicine | Admitting: Internal Medicine

## 2013-06-11 DIAGNOSIS — Z1231 Encounter for screening mammogram for malignant neoplasm of breast: Secondary | ICD-10-CM | POA: Diagnosis not present

## 2013-06-13 ENCOUNTER — Telehealth: Payer: Self-pay | Admitting: Cardiology

## 2013-06-13 NOTE — Telephone Encounter (Signed)
Pt is aware.  

## 2013-06-13 NOTE — Telephone Encounter (Signed)
New problem    Pt want results of kidney function test.

## 2013-06-13 NOTE — Telephone Encounter (Signed)
New message ° ° ° ° ° °Returning a nurses call °

## 2013-06-13 NOTE — Telephone Encounter (Signed)
I did not call this pt. Did someone else?

## 2013-06-24 ENCOUNTER — Ambulatory Visit: Payer: Medicaid Other | Admitting: Internal Medicine

## 2013-06-27 ENCOUNTER — Encounter: Payer: Self-pay | Admitting: Critical Care Medicine

## 2013-06-27 ENCOUNTER — Ambulatory Visit (INDEPENDENT_AMBULATORY_CARE_PROVIDER_SITE_OTHER): Payer: Medicaid Other | Admitting: Critical Care Medicine

## 2013-06-27 VITALS — BP 124/88 | HR 114 | Temp 98.3°F | Ht 64.0 in | Wt 394.4 lb

## 2013-06-27 DIAGNOSIS — J449 Chronic obstructive pulmonary disease, unspecified: Secondary | ICD-10-CM

## 2013-06-27 DIAGNOSIS — J4489 Other specified chronic obstructive pulmonary disease: Secondary | ICD-10-CM

## 2013-06-27 NOTE — Patient Instructions (Signed)
No change in medications. Return in         6 months Great work on your weight loss , keep it up!!!!!!

## 2013-06-27 NOTE — Progress Notes (Signed)
Subjective:    Patient ID: Jenna Chandler, female    DOB: 1965/05/28, 48 y.o.   MRN: [id number]  HPI  48 y.o.   morbidly, obese, female, with a known history of asthma, and COPD, and a former smoker.   06/27/2013 Chief Complaint  Patient presents with  . Follow-up    Breathing has improved but still notices heaviness when walking.  Has coughing spells, worse qhs, with yellowish green mucus.  No fever.  Did not finish levquin rx given in April d/t side effects.    Weight down!!!   Dyspnea is worse with exertion.  Notes some heaviness. Not as much dyspnea with exhalation Uses oxygen 24/7 and oxygen with cpap qhs.    Review of Systems  Constitutional: Negative for fever and unexpected weight change.  HENT: Negative for congestion, dental problem, ear pain, nosebleeds, postnasal drip, rhinorrhea, sinus pressure, sneezing, sore throat and trouble swallowing.   Eyes: Negative for redness and itching.  Respiratory: Negative for cough, chest tightness, shortness of breath and wheezing.   Cardiovascular: Negative for palpitations and leg swelling.  Gastrointestinal: Negative for nausea and vomiting.  Genitourinary: Negative for dysuria.  Musculoskeletal: Negative for joint swelling.  Skin: Negative for rash.  Neurological: Negative for headaches.  Hematological: Does not bruise/bleed easily.  Psychiatric/Behavioral: Negative for dysphoric mood. The patient is not nervous/anxious.        Objective:   Physical Exam  Filed Vitals:   06/27/13 1608  BP: 124/88  Pulse: 114  Temp: 98.3 F (36.8 C)  TempSrc: Oral  Height: 5\' 4"  (1.626 m)  Weight: 394 lb 6.4 oz (178.899 kg)  SpO2: 98%    WUJ:WJXBJYNW obese AAF  in no distress,  normal affect  ENT: No lesions,  mouth clear,  oropharynx clear, ++ postnasal drip Bilateral nasal purulence  Neck: No JVD, no TMG, no carotid bruits  Lungs: No use of accessory muscles, no dullness to percussion,distant BS  Cardiovascular: RRR, heart  sounds normal, no murmur or gallops, 3+  peripheral edema  Abdomen: soft and NT, no HSM,  BS normal  Musculoskeletal: No deformities, no cyanosis or clubbing  Neuro: alert, non focal  Skin: Warm, no lesions or rashes       Assessment & Plan:   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 Return in 4 months    Updated Medication List Outpatient Encounter Prescriptions as of 06/27/2013  Medication Sig  . albuterol (PROVENTIL HFA;VENTOLIN HFA) 108 (90 BASE) MCG/ACT inhaler Inhale 2 puffs into the lungs every 6 (six) hours as needed. wheezing   . albuterol (PROVENTIL) (2.5 MG/3ML) 0.083% nebulizer solution Take 2.5 mg by nebulization every 6 (six) hours as needed.  . ALPRAZolam (XANAX) 1 MG tablet Take 1 mg by mouth 2 (two) times daily as needed for anxiety.   Marland Kitchen amLODipine (NORVASC) 10 MG tablet Take 10 mg by mouth daily.  Marland Kitchen aspirin 325 MG tablet Take 325 mg by mouth daily.  . cloNIDine (CATAPRES) 0.3 MG tablet Take 1 tablet (0.3 mg total) by mouth 3 (three) times daily.  . furosemide (LASIX) 40 MG tablet Take 80 mg by mouth 2 (two) times daily.  Marland Kitchen losartan (COZAAR) 100 MG tablet Take 1 tablet (100 mg total) by mouth daily.  . metoprolol (LOPRESSOR) 100 MG tablet take 1 and 1/2 tablet by mouth twice a day  . pantoprazole (PROTONIX) 40 MG tablet Take 1 tablet (40 mg  total) by mouth daily.  . traZODone (DESYREL) 100 MG tablet Take 100 mg by mouth at bedtime.   Marland Kitchen tiotropium (SPIRIVA) 18 MCG inhalation capsule Place 18 mcg into inhaler and inhale daily as needed.  . [DISCONTINUED] Biotin 1000 MCG tablet Take 1,000 mcg by mouth daily.  . [DISCONTINUED] hydrALAZINE (APRESOLINE) 100 MG tablet Take 100 mg by mouth 3 (three) times daily.  . [DISCONTINUED] HYDROmorphone (DILAUDID) 2 MG tablet Take 1 tablet (2 mg total) by mouth every 4 (four) hours as needed for severe pain.  . [DISCONTINUED] levofloxacin  (LEVAQUIN) 500 MG tablet Take 1 tablet (500 mg total) by mouth daily.  . [DISCONTINUED] metoprolol (LOPRESSOR) 50 MG tablet Take 150 mg by mouth 3 (three) times daily.

## 2013-06-28 NOTE — Assessment & Plan Note (Signed)
Gold C Copd stable at present Plan No change in inhaled or maintenance medications. Return in 4 months

## 2013-07-23 ENCOUNTER — Encounter: Payer: Self-pay | Admitting: Cardiology

## 2013-07-23 ENCOUNTER — Ambulatory Visit (INDEPENDENT_AMBULATORY_CARE_PROVIDER_SITE_OTHER): Payer: Medicaid Other | Admitting: Cardiology

## 2013-07-23 VITALS — BP 140/94 | HR 86 | Ht 64.0 in | Wt 397.8 lb

## 2013-07-23 DIAGNOSIS — I5032 Chronic diastolic (congestive) heart failure: Secondary | ICD-10-CM

## 2013-07-23 DIAGNOSIS — I701 Atherosclerosis of renal artery: Secondary | ICD-10-CM

## 2013-07-23 DIAGNOSIS — I509 Heart failure, unspecified: Secondary | ICD-10-CM

## 2013-07-23 DIAGNOSIS — I10 Essential (primary) hypertension: Secondary | ICD-10-CM

## 2013-07-23 MED ORDER — SPIRONOLACTONE 25 MG PO TABS: 25.0000 mg | ORAL_TABLET | Freq: Every day | ORAL | Status: DC

## 2013-07-23 NOTE — Patient Instructions (Signed)
Your physician has recommended you make the following change in your medication: 1. Start Aldactone 25 MG 1 tablet daily  Your physician recommends that you go to the lab today to for Urine and Blood work  Your physician recommends that you return for lab work in: One week for a BMET  Your physician recommends that you schedule a follow-up appointment in: One week with Nurse for BP check  Your physician wants you to follow-up in: 6 months with Dr Mallie Snooks will receive a reminder letter in the mail two months in advance. If you don't receive a letter, please call our office to schedule the follow-up appointment.

## 2013-07-23 NOTE — Progress Notes (Signed)
[name], [name], [name]  [address] Phone: [phone] Fax:  [phone]  Date:  07/23/2013   ID:  Jenna Chandler, DOB 06-10-1965, MRN [id number]  PCP:  Philis Fendt, MD  Cardiologist:  Fransico Him, MD     History of Present Illness:  Jenna Chandler is a 48 y.o. female with a history of chronic diastolic CHF, HTN, morbid obesity and OSA on CPAP who presents for followup. She is doing well. She denies any chest pain, SOB, DOE, LE edema, dizziness, palpitations or syncope. She is back for HTN eval.     Wt Readings from Last 3 Encounters:  07/23/13 397 lb 12.8 oz (180.441 kg)  06/27/13 394 lb 6.4 oz (178.899 kg)  04/07/13 406 lb 9.6 oz (184.433 kg)     Past Medical History  Diagnosis Date  . Asthma   . Hypertension   . COPD (chronic obstructive pulmonary disease)     +/- asthma   . Obesity   . Manic, depressive   . Enlarged heart   . Fibromyalgia   . Morbid obesity     Current Outpatient Prescriptions  Medication Sig Dispense Refill  . albuterol (PROVENTIL HFA;VENTOLIN HFA) 108 (90 BASE) MCG/ACT inhaler Inhale 2 puffs into the lungs every 6 (six) hours as needed. wheezing       . albuterol (PROVENTIL) (2.5 MG/3ML) 0.083% nebulizer solution Take 2.5 mg by nebulization every 6 (six) hours as needed.      . ALPRAZolam (XANAX) 1 MG tablet Take 1 mg by mouth 2 (two) times daily as needed for anxiety.       Marland Kitchen amLODipine (NORVASC) 10 MG tablet Take 10 mg by mouth daily.      Marland Kitchen aspirin 325 MG tablet Take 325 mg by mouth daily.      . cloNIDine (CATAPRES) 0.3 MG tablet Take 1 tablet (0.3 mg total) by mouth 3 (three) times daily.  90 tablet  11  . furosemide (LASIX) 40 MG tablet Take 80 mg by mouth 2 (two) times daily.      Marland Kitchen losartan (COZAAR) 100 MG tablet Take 1 tablet (100 mg total) by mouth daily.  30 tablet  11  . metoprolol (LOPRESSOR) 100 MG tablet take 1 and 1/2 tablet by mouth twice a day  90 tablet  11  . pantoprazole (PROTONIX) 40 MG tablet Take  1 tablet (40 mg total) by mouth daily.  30 tablet  0  . tiotropium (SPIRIVA) 18 MCG inhalation capsule Place 18 mcg into inhaler and inhale daily as needed.      . traZODone (DESYREL) 100 MG tablet Take 100 mg by mouth at bedtime.        No current facility-administered medications for this visit.    Allergies:    Allergies  Allergen Reactions  . Levaquin [Levofloxacin]     Nauseated     Social History:  The patient  reports that she quit smoking about 11 years ago. Her smoking use included Cigarettes. She has a 30 pack-year smoking history. She has never used smokeless tobacco. She reports that she does not drink alcohol or use illicit drugs.   Family History:  The patient's family history includes Cancer in her mother.   ROS:  Please see the history of present illness.      All other systems reviewed and negative.   PHYSICAL EXAM: VS:  BP 140/94  Pulse 86  Ht 5\' 4"  (1.626 m)  Wt 397 lb 12.8 oz (  180.441 kg)  BMI 68.25 kg/m2  SpO2 94% Well nourished, well developed, in no acute distress HEENT: normal Neck: no JVD Cardiac:  normal S1, S2; RRR; no murmur Lungs:  clear to auscultation bilaterally, no wheezing, rhonchi or rales Abd: soft, nontender, no hepatomegaly Ext: no edema Skin: warm and dry Neuro:  CNs 2-12 intact, no focal abnormalities noted      ASSESSMENT AND PLAN:  1.  Chronic diastolic CHF - continue lasix  2. HTN - still inadequate control. - continue Amlodipine/Clonidine/Hydralazine/metoprolol/Losartan - check 24 hour urine for catecholamines/VMA/metanephrines/dopamine/cortisol/aldosterone - abdominal CT angio to rule out renal artery stenosis - add Aldactone 25mg  daily - recheck BP and BMET in 1 week with nurse visit.  3. Morbid obesity   Followup with me in 6 weeks  Signed, Fransico Him, MD 07/23/2013 4:00 PM

## 2013-07-24 ENCOUNTER — Telehealth: Payer: Self-pay | Admitting: *Deleted

## 2013-07-24 DIAGNOSIS — I10 Essential (primary) hypertension: Secondary | ICD-10-CM

## 2013-07-24 LAB — CORTISOL: Cortisol, Plasma: 4.8 ug/dL

## 2013-07-24 NOTE — Telephone Encounter (Signed)
Called patient to inform her that we needed to redraw some of the lab work she had yesterday.  Pt states that she will be coming by office tomorrow anyway for testing, so she will have lab work done then.

## 2013-07-25 ENCOUNTER — Inpatient Hospital Stay: Admission: RE | Admit: 2013-07-25 | Payer: Medicaid Other | Source: Ambulatory Visit

## 2013-07-28 ENCOUNTER — Encounter: Payer: Self-pay | Admitting: General Surgery

## 2013-07-31 ENCOUNTER — Ambulatory Visit (INDEPENDENT_AMBULATORY_CARE_PROVIDER_SITE_OTHER)
Admission: RE | Admit: 2013-07-31 | Discharge: 2013-07-31 | Disposition: A | Discharge status: Home / Self Care | Payer: Medicaid Other | Source: Ambulatory Visit | Attending: Cardiology | Admitting: Cardiology

## 2013-07-31 ENCOUNTER — Other Ambulatory Visit (INDEPENDENT_AMBULATORY_CARE_PROVIDER_SITE_OTHER): Payer: Medicaid Other

## 2013-07-31 ENCOUNTER — Ambulatory Visit (INDEPENDENT_AMBULATORY_CARE_PROVIDER_SITE_OTHER): Payer: Medicaid Other | Admitting: *Deleted

## 2013-07-31 VITALS — BP 147/80 | HR 87 | Wt 396.0 lb

## 2013-07-31 DIAGNOSIS — I701 Atherosclerosis of renal artery: Secondary | ICD-10-CM

## 2013-07-31 DIAGNOSIS — I10 Essential (primary) hypertension: Secondary | ICD-10-CM

## 2013-07-31 LAB — BASIC METABOLIC PANEL
BUN: 7 mg/dL (ref 6–23)
CALCIUM: 8.9 mg/dL (ref 8.4–10.5)
CHLORIDE: 103 meq/L (ref 96–112)
CO2: 29 meq/L (ref 19–32)
CREATININE: 0.8 mg/dL (ref 0.4–1.2)
GFR: 100.7 mL/min (ref >60.00)
GLUCOSE: 92 mg/dL (ref 70–99)
Potassium: 3.3 mEq/L — ABNORMAL LOW (ref 3.5–5.1)
Sodium: 138 mEq/L (ref 135–145)

## 2013-07-31 MED ORDER — IOHEXOL 350 MG/ML SOLN
100.0000 mL | Freq: Once | INTRAVENOUS | Status: AC | PRN
  Administered 2013-07-31: 100 mL via INTRAVENOUS

## 2013-07-31 NOTE — Progress Notes (Signed)
1.) Reason for visit:  Blood pressure check  2.) Name of MD requesting visit:  Dr. Radford Pax  3.) H&P:  Pt here for blood pressure check, lab work and CT.  Pt reports she is doing well. Blood pressure checked. She has not taken AM medications yet today. She reports similar readings at home since aldactone started at recent office visit. (does not have actual readings with her).   4.) ROS related to problem:  No complaints.   5.) Assessment and plan per MD:  Will forward to Dr. Radford Pax.  Note reviewed and BP noted.  Please have patient check her BP daily for a week and call with results.  Fransico Him, MD

## 2013-08-01 ENCOUNTER — Other Ambulatory Visit: Payer: Self-pay | Admitting: General Surgery

## 2013-08-01 DIAGNOSIS — Z79899 Other long term (current) drug therapy: Secondary | ICD-10-CM

## 2013-08-01 MED ORDER — POTASSIUM CHLORIDE CRYS ER 20 MEQ PO TBCR: EXTENDED_RELEASE_TABLET | ORAL | Status: DC

## 2013-08-01 NOTE — Progress Notes (Signed)
Pt is aware to check bp results. Just sending back to you as a FYI so you know I took care of it!

## 2013-08-01 NOTE — Telephone Encounter (Signed)
Pt is aware. New Rx called in.

## 2013-08-04 LAB — CATECHOLAMINES, FRACTIONATED, URINE, 24 HOUR
Calculated Total (E+NE): 54 mcg/24 h (ref 26–121)
Creatinine, Urine mg/day-CATEUR: 1.15 g/(24.h) (ref 0.63–2.50)
Dopamine, 24 hr Urine: 298 mcg/24 h (ref 52–480)
Norepinephrine, 24 hr Ur: 54 mcg/24 h (ref 15–100)
Total Volume - CF 24Hr U: 2600 mL

## 2013-08-06 LAB — ALDOSTERONE + RENIN ACTIVITY W/ RATIO
ALDO / PRA RATIO: 10.7 ratio (ref 0.9–28.9)
Aldosterone: 18 ng/dL
PRA LC/MS/MS: 1.68 ng/mL/h (ref 0.25–5.82)

## 2013-08-06 LAB — METANEPHRINES, URINE, 24 HOUR
Metaneph Total, Ur: 488 mcg/24 h (ref 182–739)
Metanephrines, Ur: 98 mcg/24 h (ref 58–203)
NORMETANEPHRINE 24H UR: 390 ug/(24.h) (ref 88–649)

## 2013-08-26 ENCOUNTER — Emergency Department (HOSPITAL_COMMUNITY): Payer: Medicaid Other

## 2013-08-26 ENCOUNTER — Encounter (HOSPITAL_COMMUNITY): Payer: Self-pay | Admitting: Emergency Medicine

## 2013-08-26 ENCOUNTER — Emergency Department (HOSPITAL_COMMUNITY)
Admission: EM | Admit: 2013-08-26 | Discharge: 2013-08-26 | Disposition: A | Discharge status: Home / Self Care | Payer: Medicaid Other | Attending: Emergency Medicine | Admitting: Emergency Medicine

## 2013-08-26 DIAGNOSIS — Y9389 Activity, other specified: Secondary | ICD-10-CM | POA: Insufficient documentation

## 2013-08-26 DIAGNOSIS — S335XXA Sprain of ligaments of lumbar spine, initial encounter: Secondary | ICD-10-CM | POA: Diagnosis not present

## 2013-08-26 DIAGNOSIS — Z87891 Personal history of nicotine dependence: Secondary | ICD-10-CM | POA: Insufficient documentation

## 2013-08-26 DIAGNOSIS — S93409A Sprain of unspecified ligament of unspecified ankle, initial encounter: Secondary | ICD-10-CM | POA: Insufficient documentation

## 2013-08-26 DIAGNOSIS — I10 Essential (primary) hypertension: Secondary | ICD-10-CM | POA: Insufficient documentation

## 2013-08-26 DIAGNOSIS — J4489 Other specified chronic obstructive pulmonary disease: Secondary | ICD-10-CM | POA: Insufficient documentation

## 2013-08-26 DIAGNOSIS — S139XXA Sprain of joints and ligaments of unspecified parts of neck, initial encounter: Secondary | ICD-10-CM | POA: Insufficient documentation

## 2013-08-26 DIAGNOSIS — F3189 Other bipolar disorder: Secondary | ICD-10-CM | POA: Insufficient documentation

## 2013-08-26 DIAGNOSIS — S8392XA Sprain of unspecified site of left knee, initial encounter: Secondary | ICD-10-CM

## 2013-08-26 DIAGNOSIS — IMO0002 Reserved for concepts with insufficient information to code with codable children: Secondary | ICD-10-CM | POA: Diagnosis not present

## 2013-08-26 DIAGNOSIS — Z79899 Other long term (current) drug therapy: Secondary | ICD-10-CM | POA: Insufficient documentation

## 2013-08-26 DIAGNOSIS — J449 Chronic obstructive pulmonary disease, unspecified: Secondary | ICD-10-CM | POA: Insufficient documentation

## 2013-08-26 DIAGNOSIS — Z7982 Long term (current) use of aspirin: Secondary | ICD-10-CM | POA: Diagnosis not present

## 2013-08-26 DIAGNOSIS — Y92410 Unspecified street and highway as the place of occurrence of the external cause: Secondary | ICD-10-CM | POA: Insufficient documentation

## 2013-08-26 DIAGNOSIS — Z8739 Personal history of other diseases of the musculoskeletal system and connective tissue: Secondary | ICD-10-CM | POA: Insufficient documentation

## 2013-08-26 DIAGNOSIS — I517 Cardiomegaly: Secondary | ICD-10-CM | POA: Diagnosis not present

## 2013-08-26 DIAGNOSIS — S233XXA Sprain of ligaments of thoracic spine, initial encounter: Secondary | ICD-10-CM

## 2013-08-26 DIAGNOSIS — S93401A Sprain of unspecified ligament of right ankle, initial encounter: Secondary | ICD-10-CM

## 2013-08-26 DIAGNOSIS — S29012A Strain of muscle and tendon of back wall of thorax, initial encounter: Secondary | ICD-10-CM

## 2013-08-26 MED ORDER — HYDROCODONE-ACETAMINOPHEN 5-325 MG PO TABS: 1.0000 | ORAL_TABLET | Freq: Four times a day (QID) | ORAL | Status: DC | PRN

## 2013-08-26 MED ORDER — CYCLOBENZAPRINE HCL 10 MG PO TABS: 10.0000 mg | ORAL_TABLET | Freq: Two times a day (BID) | ORAL | Status: DC | PRN
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
## 2013-08-26 NOTE — ED Notes (Signed)
Pt to xray at this time.
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
## 2013-08-26 NOTE — Progress Notes (Signed)
Orthopedic Tech Progress Note Patient Details:  Jenna Chandler Sep 24, 1965 [id number]  Ortho Devices Type of Ortho Device: CAM walker Ortho Device/Splint Location: RLE Ortho Device/Splint Interventions: Ordered;Application   Braulio Bosch 08/26/2013, 7:05 PM

## 2013-08-26 NOTE — Discharge Instructions (Signed)
norco for severe pain. Flexeril for spasms. Cam Walker when walking. Follow up with orthopedics specialist. Return if worsening.    Ankle Sprain An ankle sprain is an injury to the strong, fibrous tissues (ligaments) that hold the bones of your ankle joint together.  CAUSES An ankle sprain is usually caused by a fall or by twisting your ankle. Ankle sprains most commonly occur when you step on the outer edge of your foot, and your ankle turns inward. People who participate in sports are more prone to these types of injuries.  SYMPTOMS   Pain in your ankle. The pain may be present at rest or only when you are trying to stand or walk.  Swelling.  Bruising. Bruising may develop immediately or within 1 to 2 days after your injury.  Difficulty standing or walking, particularly when turning corners or changing directions. DIAGNOSIS  Your caregiver will ask you details about your injury and perform a physical exam of your ankle to determine if you have an ankle sprain. During the physical exam, your caregiver will press on and apply pressure to specific areas of your foot and ankle. Your caregiver will try to move your ankle in certain ways. An X-ray exam may be done to be sure a bone was not broken or a ligament did not separate from one of the bones in your ankle (avulsion fracture).  TREATMENT  Certain types of braces can help stabilize your ankle. Your caregiver can make a recommendation for this. Your caregiver may recommend the use of medicine for pain. If your sprain is severe, your caregiver may refer you to a surgeon who helps to restore function to parts of your skeletal system (orthopedist) or a physical therapist. [employer] ice to your injury for 1-2 days or as directed by your caregiver. Applying ice helps to reduce inflammation and pain.  Put ice in a plastic bag.  Place a towel between your skin and the bag.  Leave the ice on for 15-20 minutes at a time, every  2 hours while you are awake.  Only take over-the-counter or prescription medicines for pain, discomfort, or fever as directed by your caregiver.  Elevate your injured ankle above the level of your heart as much as possible for 2-3 days.  If your caregiver recommends crutches, use them as instructed. Gradually put weight on the affected ankle. Continue to use crutches or a cane until you can walk without feeling pain in your ankle.  If you have a plaster splint, wear the splint as directed by your caregiver. Do not rest it on anything harder than a pillow for the first 24 hours. Do not put weight on it. Do not get it wet. You may take it off to take a shower or bath.  You may have been given an elastic bandage to wear around your ankle to provide support. If the elastic bandage is too tight (you have numbness or tingling in your foot or your foot becomes cold and blue), adjust the bandage to make it comfortable.  If you have an air splint, you may blow more air into it or let air out to make it more comfortable. You may take your splint off at night and before taking a shower or bath. Wiggle your toes in the splint several times per day to decrease swelling. SEEK MEDICAL CARE IF:   You have rapidly increasing bruising or swelling.  Your toes feel extremely cold or you lose feeling in  your foot.  Your pain is not relieved with medicine. SEEK IMMEDIATE MEDICAL CARE IF:  Your toes are numb or blue.  You have severe pain that is increasing. MAKE SURE YOU:   Understand these instructions.  Will watch your condition.  Will get help right away if you are not doing well or get worse. Document Released: 01/23/2005 Document Revised: 10/18/2011 Document Reviewed: 02/04/2011 [facility] Patient Information 2015 [place], [place]. This information is not intended to replace advice given to you by your health care provider. Make sure you discuss any questions you have with your health care  provider. Cervical Sprain A cervical sprain is an injury in the neck in which the strong, fibrous tissues (ligaments) that connect your neck bones stretch or tear. Cervical sprains can range from mild to severe. Severe cervical sprains can cause the neck vertebrae to be unstable. This can lead to damage of the spinal cord and can result in serious nervous system problems. The amount of time it takes for a cervical sprain to get better depends on the cause and extent of the injury. Most cervical sprains heal in 1 to 3 weeks. CAUSES  Severe cervical sprains may be caused by:   Contact sport injuries (such as from football, rugby, wrestling, hockey, auto racing, gymnastics, diving, martial arts, or boxing).   Motor vehicle collisions.   Whiplash injuries. This is an injury from a sudden forward and backward whipping movement of the head and neck.  Falls.  Mild cervical sprains may be caused by:   Being in an awkward position, such as while cradling a telephone between your ear and shoulder.   Sitting in a chair that does not offer proper support.   Working at a poorly Landscape [employer] station.   Looking up or down for long periods of time.  SYMPTOMS   Pain, soreness, stiffness, or a burning sensation in the front, back, or sides of the neck. This discomfort may develop immediately after the injury or slowly, 24 hours or more after the injury.   Pain or tenderness directly in the middle of the back of the neck.   Shoulder or upper back pain.   Limited ability to move the neck.   Headache.   Dizziness.   Weakness, numbness, or tingling in the hands or arms.   Muscle spasms.   Difficulty swallowing or chewing.   Tenderness and swelling of the neck.  DIAGNOSIS  Most of the time your health care provider can diagnose a cervical sprain by taking your history and doing a physical exam. Your health care provider will ask about previous neck injuries and any known  neck problems, such as arthritis in the neck. X-rays may be taken to find out if there are any other problems, such as with the bones of the neck. Other tests, such as a CT scan or MRI, may also be needed.  TREATMENT  Treatment depends on the severity of the cervical sprain. Mild sprains can be treated with rest, keeping the neck in place (immobilization), and pain medicines. Severe cervical sprains are immediately immobilized. Further treatment is done to help with pain, muscle spasms, and other symptoms and may include:  Medicines, such as pain relievers, numbing medicines, or muscle relaxants.   Physical therapy. This may involve stretching exercises, strengthening exercises, and posture training. Exercises and improved posture can help stabilize the neck, strengthen muscles, and help stop symptoms from returning.  HOME CARE INSTRUCTIONS   Put ice on the injured area.  Put ice in a plastic bag.   Place a towel between your skin and the bag.   Leave the ice on for 15-20 minutes, 3-4 times a day.   If your injury was severe, you may have been given a cervical collar to wear. A cervical collar is a two-piece collar designed to keep your neck from moving while it heals.  Do not remove the collar unless instructed by your health care provider.  If you have long hair, keep it outside of the collar.  Ask your health care provider before making any adjustments to your collar. Minor adjustments may be required over time to improve comfort and reduce pressure on your chin or on the back of your head.  Ifyou are allowed to remove the collar for cleaning or bathing, follow your health care provider's instructions on how to do so safely.  Keep your collar clean by wiping it with mild soap and water and drying it completely. If the collar you have been given includes removable pads, remove them every 1-2 days and hand wash them with soap and water. Allow them to air dry. They should be  completely dry before you wear them in the collar.  If you are allowed to remove the collar for cleaning and bathing, wash and dry the skin of your neck. Check your skin for irritation or sores. If you see any, tell your health care provider.  Do not drive while wearing the collar.   Only take over-the-counter or prescription medicines for pain, discomfort, or fever as directed by your health care provider.   Keep all follow-up appointments as directed by your health care provider.   Keep all physical therapy appointments as directed by your health care provider.   Make any needed adjustments to your workstation to promote good posture.   Avoid positions and activities that make your symptoms worse.   Warm up and stretch before being active to help prevent problems.  SEEK MEDICAL CARE IF:   Your pain is not controlled with medicine.   You are unable to decrease your pain medicine over time as planned.   Your activity level is not improving as expected.  SEEK IMMEDIATE MEDICAL CARE IF:   You develop any bleeding.  You develop stomach upset.  You have signs of an allergic reaction to your medicine.   Your symptoms get worse.   You develop new, unexplained symptoms.   You have numbness, tingling, weakness, or paralysis in any part of your body.  MAKE SURE YOU:   Understand these instructions.  Will watch your condition.  Will get help right away if you are not doing well or get worse. Document Released: 11/20/2006 Document Revised: 01/28/2013 Document Reviewed: 07/31/2012 [facility] Patient Information 2015 [place], [place]. This information is not intended to replace advice given to you by your health care provider. Make sure you discuss any questions you have with your health care provider.  Lumbosacral Strain Lumbosacral strain is a strain of any of the parts that make up your lumbosacral vertebrae. Your lumbosacral vertebrae are the bones that make up the  lower third of your backbone. Your lumbosacral vertebrae are held together by muscles and tough, fibrous tissue (ligaments).  CAUSES  A sudden blow to your back can cause lumbosacral strain. Also, anything that causes an excessive stretch of the muscles in the low back can cause this strain. This is typically seen when people exert themselves strenuously, fall, lift heavy objects, bend, or crouch repeatedly. RISK  FACTORS  Physically demanding work.  Participation in pushing or pulling sports or sports that require a sudden twist of the back (tennis, golf, baseball).  Weight lifting.  Excessive lower back curvature.  Forward-tilted pelvis.  Weak back or abdominal muscles or both.  Tight hamstrings. SIGNS AND SYMPTOMS  Lumbosacral strain may cause pain in the area of your injury or pain that moves (radiates) down your leg.  DIAGNOSIS Your health care provider can often diagnose lumbosacral strain through a physical exam. In some cases, you may need tests such as X-ray exams.  TREATMENT  Treatment for your lower back injury depends on many factors that your clinician will have to evaluate. However, most treatment will include the use of anti-inflammatory medicines. HOME CARE INSTRUCTIONS   Avoid hard physical activities (tennis, racquetball, waterskiing) if you are not in proper physical condition for it. This may aggravate or create problems.  If you have a back problem, avoid sports requiring sudden body movements. Swimming and walking are generally safer activities.  Maintain good posture.  Maintain a healthy weight.  For acute conditions, you may put ice on the injured area.  Put ice in a plastic bag.  Place a towel between your skin and the bag.  Leave the ice on for 20 minutes, 2-3 times a day.  When the low back starts healing, stretching and strengthening exercises may be recommended. SEEK MEDICAL CARE IF:  Your back pain is getting worse.  You experience severe  back pain not relieved with medicines. SEEK IMMEDIATE MEDICAL CARE IF:   You have numbness, tingling, weakness, or problems with the use of your arms or legs.  There is a change in bowel or bladder control.  You have increasing pain in any area of the body, including your belly (abdomen).  You notice shortness of breath, dizziness, or feel faint.  You feel sick to your stomach (nauseous), are throwing up (vomiting), or become sweaty.  You notice discoloration of your toes or legs, or your feet get very cold. MAKE SURE YOU:   Understand these instructions.  Will watch your condition.  Will get help right away if you are not doing well or get worse. Document Released: 11/02/2004 Document Revised: 01/28/2013 Document Reviewed: 09/11/2012 [facility] Patient Information 2015 [place], [place]. This information is not intended to replace advice given to you by your health care provider. Make sure you discuss any questions you have with your health care provider.

## 2013-08-26 NOTE — ED Notes (Signed)
Ortho paged for cam walker.

## 2013-08-27 NOTE — ED Provider Notes (Signed)
Medical screening examination/treatment/procedure(s) were performed by non-physician practitioner and as supervising physician I was immediately available for consultation/collaboration.   EKG Interpretation None        Elyn Peers, MD 08/27/13 [phone]

## 2013-10-22 ENCOUNTER — Telehealth: Payer: Self-pay | Admitting: Critical Care Medicine

## 2013-10-22 MED ORDER — DOXYCYCLINE HYCLATE 100 MG PO TABS: 100.0000 mg | ORAL_TABLET | Freq: Two times a day (BID) | ORAL | Status: DC

## 2013-10-22 NOTE — Telephone Encounter (Signed)
Spoke with the pt  She c/o cough x 1 wk- prod with moderate thick, yellow sputum  She states that she wakes up in the night and feels like her throat is full of mucus  She denies any increased SOB, wheezing, chest tightness, CP, f/c/s She has been gargling with listerine to help with her cough.Lupita Leash, please advise, thanks! Allergies  Allergen Reactions  . Levaquin [Levofloxacin]     Nauseated

## 2013-10-22 NOTE — Telephone Encounter (Signed)
Can have doxycycline 100mg  bid for 5 days, no fills. Needs ov if she is not improving.

## 2013-10-22 NOTE — Telephone Encounter (Signed)
lmomtcb x1 for pt rx sent

## 2013-10-23 NOTE — Telephone Encounter (Signed)
Spoke with the pt and notified that rx for doxy was sent and to call if not improving  She verbalized understanding  Nothing further needed

## 2013-12-08 ENCOUNTER — Encounter (HOSPITAL_COMMUNITY): Payer: Self-pay | Admitting: Emergency Medicine

## 2013-12-24 ENCOUNTER — Ambulatory Visit: Payer: Medicaid Other | Admitting: Critical Care Medicine

## 2013-12-29 ENCOUNTER — Encounter: Payer: Self-pay | Admitting: Critical Care Medicine

## 2013-12-29 ENCOUNTER — Ambulatory Visit (INDEPENDENT_AMBULATORY_CARE_PROVIDER_SITE_OTHER): Payer: Medicaid Other | Admitting: Critical Care Medicine

## 2013-12-29 VITALS — BP 144/86 | HR 78 | Ht 65.0 in | Wt >= 6400 oz

## 2013-12-29 DIAGNOSIS — J449 Chronic obstructive pulmonary disease, unspecified: Secondary | ICD-10-CM

## 2013-12-29 DIAGNOSIS — Z23 Encounter for immunization: Secondary | ICD-10-CM

## 2013-12-29 MED ORDER — PANTOPRAZOLE SODIUM 40 MG PO TBEC: 40.0000 mg | DELAYED_RELEASE_TABLET | Freq: Every day | ORAL | Status: DC

## 2013-12-29 MED ORDER — DOXYCYCLINE HYCLATE 100 MG PO TABS: 100.0000 mg | ORAL_TABLET | Freq: Two times a day (BID) | ORAL | Status: DC

## 2013-12-29 MED ORDER — ALBUTEROL SULFATE HFA 108 (90 BASE) MCG/ACT IN AERS: 2.0000 | INHALATION_SPRAY | Freq: Four times a day (QID) | RESPIRATORY_TRACT | Status: AC | PRN

## 2013-12-29 MED ORDER — SPIRONOLACTONE 25 MG PO TABS: 25.0000 mg | ORAL_TABLET | Freq: Every day | ORAL | Status: DC

## 2013-12-29 MED ORDER — ALBUTEROL SULFATE (2.5 MG/3ML) 0.083% IN NEBU: 2.5000 mg | INHALATION_SOLUTION | Freq: Four times a day (QID) | RESPIRATORY_TRACT | Status: DC | PRN

## 2013-12-29 NOTE — Patient Instructions (Addendum)
Refills on albuterol sent, also on protonix Doxycycline one twice daily for 7days Wear oxygen at night with cpap 3Liters Return 4 months Flu vaccine was given

## 2013-12-29 NOTE — Assessment & Plan Note (Signed)
Gold C Copd with acute bronchitis Plan Refills on albuterol sent, also on protonix Doxycycline one twice daily for 7days Wear oxygen at night with cpap 3Liters Return 4 months Flu vaccine was given

## 2013-12-29 NOTE — Progress Notes (Signed)
Subjective:    Patient ID: Jenna Chandler, female    DOB: Jun 10, 1965, 48 y.o.   MRN: [id number]  HPI 48 y.o.   morbidly, obese, female, with a known history of asthma, and COPD, and a former smoker.    12/29/2013 Chief Complaint  Patient presents with  . Follow-up    breathing worse with moving around; still using 2L pulse when walking around; coughing up dark brown phlegm  Weight cont to increase.  Using oxygen 2L.  Typical meal plan: green smoothies, fried chicken, etc.  Now dark mucus, esp in AM, thick diff to raise mucus.  Notes some wheezing.  Pt denies any significant sore throat, nasal congestion or excess secretions, fever, chills, sweats, unintended weight loss, pleurtic or exertional chest pain, orthopnea PND, or leg swelling Pt denies any increase in rescue therapy over baseline, denies waking up needing it or having any early am or nocturnal exacerbations of coughing/wheezing/or dyspnea. Pt also denies any obvious fluctuation in symptoms with  weather or environmental change or other alleviating or aggravating factors   Review of Systems  Constitutional: Negative for fever and unexpected weight change.  HENT: Negative for congestion, dental problem, ear pain, nosebleeds, postnasal drip, rhinorrhea, sinus pressure, sneezing, sore throat and trouble swallowing.   Eyes: Negative for redness and itching.  Respiratory: Negative for cough, chest tightness, shortness of breath and wheezing.   Cardiovascular: Negative for palpitations and leg swelling.  Gastrointestinal: Negative for nausea and vomiting.  Genitourinary: Negative for dysuria.  Musculoskeletal: Negative for joint swelling.  Skin: Negative for rash.  Neurological: Negative for headaches.  Hematological: Does not bruise/bleed easily.  Psychiatric/Behavioral: Negative for dysphoric mood. The patient is not nervous/anxious.        Objective:   Physical Exam Filed Vitals:   12/29/13 1133  BP: 144/86  Pulse: 78   Height: 5\' 5"  (1.651 m)  Weight: 419 lb 9.6 oz (190.329 kg)  SpO2: 98%    XVQ:MGQQPYPP obese AAF  in no distress,  normal affect  ENT: No lesions,  mouth clear,  oropharynx clear, ++ postnasal drip Bilateral nasal purulence  Neck: No JVD, no TMG, no carotid bruits  Lungs: No use of accessory muscles, no dullness to percussion,distant BS  Cardiovascular: RRR, heart sounds normal, no murmur or gallops, 3+  peripheral edema  Abdomen: soft and NT, no HSM,  BS normal  Musculoskeletal: No deformities, no cyanosis or clubbing  Neuro: alert, non focal  Skin: Warm, no lesions or rashes       Assessment & Plan:   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 albuterol (PROVENTIL HFA;VENTOLIN HFA) 108 (90 BASE) MCG/ACT inhaler Inhale 2 puffs into the lungs every 6 (six) hours as needed. wheezing  . albuterol (PROVENTIL) (2.5 MG/3ML) 0.083% nebulizer solution Take 3 mLs (2.5 mg total) by nebulization every 6 (six) hours as needed.  . ALPRAZolam (XANAX) 1 MG tablet Take 1 mg by mouth 2 (two) times daily as needed for anxiety.   Marland Kitchen amLODipine (NORVASC) 10 MG tablet Take 10 mg by mouth daily.  Marland Kitchen aspirin 325 MG tablet Take 325 mg by mouth daily.  . cloNIDine (CATAPRES) 0.3 MG tablet Take 1 tablet (0.3 mg  total) by mouth 3 (three) times daily.  . cyclobenzaprine (FLEXERIL) 10 MG tablet Take 1 tablet (10 mg total) by mouth 2 (two) times daily as needed for muscle spasms.  . furosemide (LASIX) 40 MG tablet Take 80 mg by mouth 2 (two) times daily.  Marland Kitchen HYDROcodone-acetaminophen (NORCO) 5-325 MG per tablet Take 1 tablet by mouth every 6 (six) hours as needed.  Marland Kitchen losartan (COZAAR) 100 MG tablet Take 1  tablet (100 mg total) by mouth daily.  . metoprolol (LOPRESSOR) 100 MG tablet take 1 and 1/2 tablet by mouth twice a day  . pantoprazole (PROTONIX) 40 MG tablet Take 1 tablet (40 mg total) by mouth daily.  Marland Kitchen spironolactone (ALDACTONE) 25 MG tablet Take 1 tablet (25 mg total) by mouth daily.  . traZODone (DESYREL) 100 MG tablet Take 100 mg by mouth at bedtime.   . [DISCONTINUED] albuterol (PROVENTIL HFA;VENTOLIN HFA) 108 (90 BASE) MCG/ACT inhaler Inhale 2 puffs into the lungs every 6 (six) hours as needed. wheezing   . [DISCONTINUED] albuterol (PROVENTIL) (2.5 MG/3ML) 0.083% nebulizer solution Take 2.5 mg by nebulization every 6 (six) hours as needed.  . [DISCONTINUED] pantoprazole (PROTONIX) 40 MG tablet Take 1 tablet (40 mg total) by mouth daily.  . [DISCONTINUED] pantoprazole (PROTONIX) 40 MG tablet Take 1 tablet (40 mg total) by mouth daily.  . [DISCONTINUED] spironolactone (ALDACTONE) 25 MG tablet Take 25 mg by mouth daily.  Marland Kitchen doxycycline (VIBRA-TABS) 100 MG tablet Take 1 tablet (100 mg total) by mouth 2 (two) times daily.  . [DISCONTINUED] doxycycline (VIBRA-TABS) 100 MG tablet Take 1 tablet (100 mg total) by mouth 2 (two) times daily. (Patient not taking: Reported on 12/29/2013)  . [DISCONTINUED] potassium chloride SA (K-DUR,KLOR-CON) 20 MEQ tablet Take two tablets today. Starting tomorrow take 1 tablet daily. (Patient not taking: Reported on 12/29/2013)  . [DISCONTINUED] tiotropium (SPIRIVA) 18 MCG inhalation capsule Place 18 mcg into inhaler and inhale daily as needed.

## 2014-01-20 ENCOUNTER — Ambulatory Visit: Payer: Medicaid Other | Admitting: Cardiology

## 2014-02-12 ENCOUNTER — Ambulatory Visit: Payer: Medicaid Other | Admitting: Cardiology

## 2014-03-10 ENCOUNTER — Encounter: Payer: Self-pay | Admitting: Cardiology

## 2014-03-10 NOTE — Progress Notes (Signed)
This encounter was created in error - please disregard.

## 2014-03-11 ENCOUNTER — Encounter: Payer: Medicaid Other | Admitting: Cardiology

## 2014-03-26 ENCOUNTER — Telehealth: Payer: Self-pay | Admitting: Critical Care Medicine

## 2014-03-26 MED ORDER — DOXYCYCLINE HYCLATE 100 MG PO TABS: ORAL_TABLET | ORAL | Status: DC

## 2014-03-26 NOTE — Telephone Encounter (Signed)
Called spoke with pt. She is asking for doxy to be called in. She c/o prod cough (brown phlem). No wheezing, no chest tx, no PND, no nasal congestion. Pt reports she is not able to come in for OV. Per [facility] PW not available this afternoon. Please advise Dr. Annamaria Boots thanks  Allergies  Allergen Reactions  . Levaquin [Levofloxacin]     Nauseated      Current Outpatient Prescriptions on File Prior to Visit  Medication Sig Dispense Refill  . albuterol (PROVENTIL HFA;VENTOLIN HFA) 108 (90 BASE) MCG/ACT inhaler Inhale 2 puffs into the lungs every 6 (six) hours as needed. wheezing 18 g 4  . albuterol (PROVENTIL) (2.5 MG/3ML) 0.083% nebulizer solution Take 3 mLs (2.5 mg total) by nebulization every 6 (six) hours as needed. 75 mL 6  . ALPRAZolam (XANAX) 1 MG tablet Take 1 mg by mouth 2 (two) times daily as needed for anxiety.     Marland Kitchen amLODipine (NORVASC) 10 MG tablet Take 10 mg by mouth daily.    Marland Kitchen aspirin 325 MG tablet Take 325 mg by mouth daily.    . cloNIDine (CATAPRES) 0.3 MG tablet Take 1 tablet (0.3 mg total) by mouth 3 (three) times daily. 90 tablet 11  . cyclobenzaprine (FLEXERIL) 10 MG tablet Take 1 tablet (10 mg total) by mouth 2 (two) times daily as needed for muscle spasms. 20 tablet 0  . doxycycline (VIBRA-TABS) 100 MG tablet Take 1 tablet (100 mg total) by mouth 2 (two) times daily. 14 tablet 0  . furosemide (LASIX) 40 MG tablet Take 80 mg by mouth 2 (two) times daily.    Marland Kitchen HYDROcodone-acetaminophen (NORCO) 5-325 MG per tablet Take 1 tablet by mouth every 6 (six) hours as needed. 20 tablet 0  . losartan (COZAAR) 100 MG tablet Take 1 tablet (100 mg total) by mouth daily. 30 tablet 11  . metoprolol (LOPRESSOR) 100 MG tablet take 1 and 1/2 tablet by mouth twice a day 90 tablet 11  . pantoprazole (PROTONIX) 40 MG tablet Take 1 tablet (40 mg total) by mouth daily. 30 tablet 6  . spironolactone (ALDACTONE) 25 MG tablet Take 1 tablet (25 mg total) by mouth daily. 30 tablet 6  . traZODone (DESYREL) 100  MG tablet Take 100 mg by mouth at bedtime.      No current facility-administered medications on file prior to visit.

## 2014-03-26 NOTE — Telephone Encounter (Signed)
Doxycycline 100 mg, # 8, 2 today then one daily

## 2014-03-26 NOTE — Telephone Encounter (Signed)
Rx for Doxycycline sent by Dr. Annamaria Boots in Dr. Bettina Gavia absence.  Patient notified.  Nothing further needed.

## 2014-04-17 ENCOUNTER — Telehealth: Payer: Self-pay | Admitting: Cardiology

## 2014-04-17 NOTE — Telephone Encounter (Signed)
Request for surgical clearance:  1. What type of surgery is being performed? Tooth extraction  2. When is this surgery scheduled? 3.23.16   3. Are there any medications that need to be held prior to surgery and how long? [employer]ayer Aspirin 325mg  ..need to know what need to be held   4. Name of physician performing surgery? Dr Sharlett Iles   5. What is your office phone and fax number? Office [phone].Marland KitchenFax #  [phone]   Please fax to there office per pt calling.

## 2014-04-20 NOTE — Telephone Encounter (Signed)
Her dentist needs to instruct her how long to hold ASA

## 2014-04-20 NOTE — Telephone Encounter (Signed)
Returned pt's call in regards to oral surgery. Pt states that she just wanted to update that her surgery had been moved to 04/30/14. Pt states she was also following up on how long to hold her ASA. Informed pt that Dr. Radford Pax and her nurse were out of the office today but that they would be here tomorrow. Informed pt that I would route this information to Dr. Radford Pax and her nurse, Valetta Fuller, for review, advisement and follow up. Pt verbalized understanding and was in agreement with this plan.

## 2014-04-20 NOTE — Telephone Encounter (Signed)
She needs to contact oral surgeon regarding what meds she needs to hole

## 2014-04-21 NOTE — Telephone Encounter (Signed)
Informed patient her dentist needs to instruct her on how long to hold the ASA. Patient requesting clearance to hold ASA 325 mg for 4 days prior to surgery. If unable to hold from a cardiac standpoint, she will take a lower dose for those 4 days.  To Dr. Radford Pax.

## 2014-04-21 NOTE — Telephone Encounter (Signed)
I did not put her on ASA - her PCP did so she needs to check with PCP

## 2014-04-22 NOTE — Telephone Encounter (Signed)
Left message to call back  

## 2014-04-23 ENCOUNTER — Telehealth: Payer: Self-pay | Admitting: Cardiology

## 2014-04-23 NOTE — Telephone Encounter (Signed)
New message ° ° ° ° ° ° °Pt returning nurse call  °

## 2014-04-23 NOTE — Telephone Encounter (Signed)
Contacted the pt to inform her that per Dr Radford Pax, she did not prescribe her ASA, so she could not advise on the clearance of this med for her surgery.  Informed the pt that per Dr Radford Pax, her PCP did prescribe her this, and she should follow-up with him to further advise on clearance.  Pt verbalized understanding and agrees with this plan.

## 2014-05-04 NOTE — Progress Notes (Signed)
Cardiology Office Note   Date:  05/05/2014   ID:  Jenna Chandler, DOB 08-28-1965, MRN [id number]  PCP:  Philis Fendt, MD  Cardiologist:   Sueanne Margarita, MD   Chief Complaint  Patient presents with  . Hypertension  . Congestive Heart Failure      History of Present Illness: Jenna Chandler is a 49 y.o. female with a history of chronic diastolic CHF, HTN, morbid obesity and OSA on CPAP who presents for followup. She is doing well. She denies any LE edema, dizziness, palpitations or syncope. She is back for HTN eval. She says that she has had a pinching chest pain in the midsternal area and radiates to her left arm for the past few weeks.  She has been under a lot of stress recently.  She notices that it occurs when she is just sitting doing nothing.  She has gotten it when she exerts herself.  She has extreme DOE with walking but has morbid obesity.  She uses O2 PRN and is followed by Dr. Joya Gaskins.  She feels a lot more rested in the am since going on CPAP.  She dose not have significant daytime sleepiness.  She does still snore some.  She feels the pressure could be higher.  She has problems with dry mouth.  She uses the full face mask and has not been using the humidifier    Past Medical History  Diagnosis Date  . Asthma   . Hypertension   . COPD (chronic obstructive pulmonary disease)     +/- asthma   . Obesity   . Manic, depressive   . Enlarged heart   . Fibromyalgia   . Morbid obesity   . OSA (obstructive sleep apnea)     No past surgical history on file.   Current Outpatient Prescriptions  Medication Sig Dispense Refill  . albuterol (PROVENTIL HFA;VENTOLIN HFA) 108 (90 BASE) MCG/ACT inhaler Inhale 2 puffs into the lungs every 6 (six) hours as needed. wheezing 18 g 4  . albuterol (PROVENTIL) (2.5 MG/3ML) 0.083% nebulizer solution Take 3 mLs (2.5 mg total) by nebulization every 6 (six) hours as needed. 75 mL 6  . ALPRAZolam (XANAX) 1 MG tablet Take 1 mg by mouth 2  (two) times daily as needed for anxiety.     Marland Kitchen amLODipine (NORVASC) 10 MG tablet Take 10 mg by mouth daily.    Marland Kitchen aspirin 325 MG tablet Take 325 mg by mouth daily.    . cloNIDine (CATAPRES) 0.3 MG tablet Take 1 tablet (0.3 mg total) by mouth 3 (three) times daily. 90 tablet 11  . clotrimazole-betamethasone (LOTRISONE) cream   0  . cyclobenzaprine (FLEXERIL) 10 MG tablet Take 1 tablet (10 mg total) by mouth 2 (two) times daily as needed for muscle spasms. 20 tablet 0  . furosemide (LASIX) 40 MG tablet Take 80 mg by mouth 2 (two) times daily.    Marland Kitchen gabapentin (NEURONTIN) 300 MG capsule Take 300 mg by mouth as needed. For pain  0  . hydrochlorothiazide (HYDRODIURIL) 25 MG tablet Take 25 mg by mouth daily.  0  . HYDROcodone-acetaminophen (NORCO) 5-325 MG per tablet Take 1 tablet by mouth every 6 (six) hours as needed. 20 tablet 0  . ibuprofen (ADVIL,MOTRIN) 800 MG tablet Take 800 mg by mouth as needed. For pain  0  . metoprolol (LOPRESSOR) 100 MG tablet take 1 and 1/2 tablet by mouth twice a day 90 tablet 11  . omeprazole (PRILOSEC) 20 MG  capsule Take 20 mg by mouth daily.  0  . spironolactone (ALDACTONE) 25 MG tablet Take 1 tablet (25 mg total) by mouth daily. 30 tablet 6  . traZODone (DESYREL) 100 MG tablet Take 100 mg by mouth at bedtime.     Marland Kitchen losartan (COZAAR) 100 MG tablet Take 1 tablet (100 mg total) by mouth daily. (Patient not taking: Reported on 05/05/2014) 30 tablet 11   No current facility-administered medications for this visit.    Allergies:   Levaquin    Social History:  The patient  reports that she quit smoking about 11 years ago. Her smoking use included Cigarettes. She has a 30 pack-year smoking history. She has never used smokeless tobacco. She reports that she does not drink alcohol or use illicit drugs.   Family History:  The patient's family history includes Cancer in her mother.    ROS:  Please see the history of present illness.   Otherwise, review of systems are  positive for cough, anxiety, back and muscular pain.   All other systems are reviewed and negative.    PHYSICAL EXAM: VS:  BP 125/64 mmHg  Pulse 82  Ht 5\' 4"  (1.626 m)  Wt 414 lb 3.2 oz (187.88 kg)  BMI 71.06 kg/m2  LMP 04/26/2014 , BMI Body mass index is 71.06 kg/(m^2). GEN: Well nourished, well developed, in no acute distress HEENT: normal Neck: no JVD, carotid bruits, or masses Cardiac: RRR; no murmurs, rubs, or gallops,no edema  Respiratory:  clear to auscultation bilaterally, normal work of breathing GI: soft, nontender, nondistended, + BS MS: no deformity or atrophy Skin: warm and dry, no rash Neuro:  Strength and sensation are intact Psych: euthymic mood, full affect   EKG:  EKG was ordered today and showed NSR with no ST changes   Recent Labs: 07/31/2013: BUN 7; Creatinine 0.8; Potassium 3.3*; Sodium 138    Lipid Panel    Component Value Date/Time   CHOL 152 03/04/2008 2146   TRIG 103 03/04/2008 2146   HDL 56 03/04/2008 2146   CHOLHDL 2.7 Ratio 03/04/2008 2146   VLDL 21 03/04/2008 2146   LDLCALC 75 03/04/2008 2146      Wt Readings from Last 3 Encounters:  05/05/14 414 lb 3.2 oz (187.88 kg)  12/29/13 419 lb 9.6 oz (190.329 kg)  08/26/13 397 lb (180.078 kg)     ASSESSMENT AND PLAN:  1. Chronic diastolic CHF - appears euvolemic on exam today - continue lasix /aldactone 2. HTN - controlled.  No evidence of RAS on Abdominal CTA.  Urine catechols normal - Amlodipine/Clonidine/Hydralazine/metoprolol/Losartan/aldactone/HCTZ - check BMET 3. Morbid obesity  4.  Chest pain with typical and atypical components.  I will get a 2 day Lexiscan myoview 5.  Severe DOE most likely related to morbid obesity. 6.  OSA on CPAP and tolerating well.  I will check a download from DME    Current medicines are reviewed at length with the patient today.  The patient does not have concerns regarding medicines.  The following changes have been made:  no change  Labs/ tests  ordered today include: BMET  No orders of the defined types were placed in this encounter.     Disposition:   FU with me in 6 months   Signed, Sueanne Margarita, MD  05/05/2014 1:45 PM    [facility] [address], [place], [place]  [address] Phone: [phone]; Fax: [phone]

## 2014-05-05 ENCOUNTER — Ambulatory Visit (INDEPENDENT_AMBULATORY_CARE_PROVIDER_SITE_OTHER): Payer: Medicaid Other | Admitting: Cardiology

## 2014-05-05 ENCOUNTER — Encounter: Payer: Self-pay | Admitting: Cardiology

## 2014-05-05 VITALS — BP 125/64 | HR 82 | Ht 64.0 in | Wt >= 6400 oz

## 2014-05-05 DIAGNOSIS — R0602 Shortness of breath: Secondary | ICD-10-CM

## 2014-05-05 DIAGNOSIS — I10 Essential (primary) hypertension: Secondary | ICD-10-CM | POA: Diagnosis not present

## 2014-05-05 DIAGNOSIS — R079 Chest pain, unspecified: Secondary | ICD-10-CM | POA: Diagnosis not present

## 2014-05-05 DIAGNOSIS — I5032 Chronic diastolic (congestive) heart failure: Secondary | ICD-10-CM | POA: Diagnosis not present

## 2014-05-05 LAB — BASIC METABOLIC PANEL
BUN: 11 mg/dL (ref 6–23)
CHLORIDE: 103 meq/L (ref 96–112)
CO2: 29 meq/L (ref 19–32)
CREATININE: 0.66 mg/dL (ref 0.40–1.20)
Calcium: 9.4 mg/dL (ref 8.4–10.5)
GFR: 121.73 mL/min (ref >60.00)
GLUCOSE: 84 mg/dL (ref 70–99)
Potassium: 3.6 mEq/L (ref 3.5–5.1)
SODIUM: 139 meq/L (ref 135–145)

## 2014-05-05 NOTE — Patient Instructions (Addendum)
Your physician has requested that you have a 2-day [place]. For further information please visit [email or web address]. Please follow instruction sheet, as given.  Your physician recommends that you have lab work TODAY (BMET).  Your physician wants you to follow-up in: 6 months with Dr. Radford Pax. You will receive a reminder letter in the mail two months in advance. If you don't receive a letter, please call our office to schedule the follow-up appointment.

## 2014-05-06 ENCOUNTER — Telehealth: Payer: Self-pay

## 2014-05-06 DIAGNOSIS — I10 Essential (primary) hypertension: Secondary | ICD-10-CM

## 2014-05-06 MED ORDER — POTASSIUM CHLORIDE ER 10 MEQ PO TBCR: 10.0000 meq | EXTENDED_RELEASE_TABLET | Freq: Every day | ORAL | Status: DC

## 2014-05-06 NOTE — Telephone Encounter (Signed)
-----   Message from Sueanne Margarita, MD sent at 05/05/2014  4:15 PM EDT ----- Please add Kdur 20meq daily and recheck BMET in 1 week

## 2014-05-06 NOTE — Telephone Encounter (Signed)
Informed patient of results and verbal understanding expressed.   Instructed patient to START KDUR 10 meq daily. Lab work scheduled for next Wednesday.  Patient agreed with treatment plan.

## 2014-05-13 ENCOUNTER — Encounter (HOSPITAL_COMMUNITY): Payer: Medicaid Other

## 2014-05-13 ENCOUNTER — Other Ambulatory Visit: Payer: Medicaid Other

## 2014-05-21 ENCOUNTER — Other Ambulatory Visit: Payer: Medicaid Other

## 2014-05-21 ENCOUNTER — Encounter (HOSPITAL_COMMUNITY): Payer: Medicaid Other

## 2014-05-22 ENCOUNTER — Telehealth: Payer: Self-pay | Admitting: Critical Care Medicine

## 2014-05-22 MED ORDER — PREDNISONE 10 MG PO TABS: ORAL_TABLET | ORAL | Status: DC

## 2014-05-22 MED ORDER — DOXYCYCLINE HYCLATE 100 MG PO TABS: 100.0000 mg | ORAL_TABLET | Freq: Two times a day (BID) | ORAL | Status: DC

## 2014-05-22 NOTE — Telephone Encounter (Signed)
Spoke with pt, c/o prod cough with yellow/brown mucus, sob Xseveral days.  Denies sinus congestion, chest pain, fever.  Pt uses [facility] on [address].  MW please advise on recs in PW's absence.  Thanks!

## 2014-05-22 NOTE — Telephone Encounter (Signed)
Pt aware of rec's per MW Rx's have been called into pharmacy.  Nothing further needed.

## 2014-05-22 NOTE — Telephone Encounter (Signed)
Doxycycline one twice daily for 7days .Prednisone 10 mg take  4 each am x 2 days,   2 each am x 2 days,  1 each am x 2 days and stop   F/u first of week if not better, to ER in interim if worse

## 2014-07-01 ENCOUNTER — Telehealth: Payer: Self-pay | Admitting: Critical Care Medicine

## 2014-07-01 MED ORDER — DOXYCYCLINE HYCLATE 100 MG PO TABS: 100.0000 mg | ORAL_TABLET | Freq: Two times a day (BID) | ORAL | Status: DC

## 2014-07-01 NOTE — Telephone Encounter (Signed)
RX sent to pharmacy per PW. Pt informed if no better after abx that she needs to make appt to come in and see TP. Nothing further needed at this time.

## 2014-07-01 NOTE — Telephone Encounter (Signed)
Spoke with pt. Would like a refill on Doxycycline. Reports increased mucus production. Mucus is brown and thick. Denies SOB, chest tightness, wheezing or fever. Has not tried any OTC medications.  PW - please advise. Thanks.

## 2014-07-01 NOTE — Telephone Encounter (Signed)
Ok to refill doxy 100mg  bid x 5 days Ov if unimproved with TP

## 2014-09-09 ENCOUNTER — Encounter: Payer: Self-pay | Admitting: Cardiology

## 2014-10-08 ENCOUNTER — Telehealth: Payer: Self-pay | Admitting: Critical Care Medicine

## 2014-10-08 NOTE — Telephone Encounter (Signed)
lmtcb X1 for pt  

## 2014-10-09 MED ORDER — DOXYCYCLINE HYCLATE 100 MG PO TABS: 100.0000 mg | ORAL_TABLET | Freq: Two times a day (BID) | ORAL | Status: DC

## 2014-10-09 NOTE — Addendum Note (Signed)
Addended by: Len Blalock on: 10/09/2014 10:46 AM   Modules accepted: Orders

## 2014-10-09 NOTE — Telephone Encounter (Signed)
Closed in error ---------------------------------- Patient says that she is coughing up thick, brown mucus and that she usually get a RX of Doxycycline when this happens.  Dr. Joya Gaskins, ok to send Doxy? Please advise.

## 2014-10-09 NOTE — Telephone Encounter (Signed)
Spoke with pt, aware of recs.  rx sent to verified pharmacy.  Nothing further needed.

## 2014-10-09 NOTE — Telephone Encounter (Signed)
Ok for doxycycline 100mg  bid x 7 days

## 2014-10-21 ENCOUNTER — Other Ambulatory Visit: Payer: Self-pay | Admitting: Internal Medicine

## 2014-10-21 DIAGNOSIS — N63 Unspecified lump in unspecified breast: Secondary | ICD-10-CM

## 2014-10-26 ENCOUNTER — Other Ambulatory Visit: Payer: Self-pay | Admitting: Critical Care Medicine

## 2014-10-27 NOTE — Telephone Encounter (Signed)
Received doxy rx request from [facility]. Doxy rx last sent for pt on 9.2.16.   Pt last seen by PW on 12/2013. Called, spoke with pt.  Pt states pharm did request this rx for her.  States she is having cough with thick, dark, heavy mucus.  Cough mostly at night.  Reports this has been going on "for years" and comes and goes.  States it usually gets better with doxy but after finishing doxy symptoms return.  Pt believes she needs another abx instead of doxy and is in agreement that she needs OV.  Pt denies increased SOB or f/c/s.  Offered OV tomorrow.  Pt declined - states she will need to come in next week d/t transportation issues.  OV scheduled on Sept 28 at 11:15 per pt's request.  Pt aware and is to call back if needed prior to appt.

## 2014-10-29 ENCOUNTER — Ambulatory Visit
Admission: RE | Admit: 2014-10-29 | Discharge: 2014-10-29 | Disposition: A | Discharge status: Home / Self Care | Payer: Medicaid Other | Source: Ambulatory Visit | Attending: Internal Medicine | Admitting: Internal Medicine

## 2014-10-29 ENCOUNTER — Other Ambulatory Visit: Payer: Self-pay | Admitting: Internal Medicine

## 2014-10-29 DIAGNOSIS — N63 Unspecified lump in unspecified breast: Secondary | ICD-10-CM

## 2014-11-03 ENCOUNTER — Telehealth: Payer: Self-pay | Admitting: Pulmonary Disease

## 2014-11-03 MED ORDER — AZITHROMYCIN 250 MG PO TABS: ORAL_TABLET | ORAL | Status: DC

## 2014-11-03 NOTE — Telephone Encounter (Signed)
Pt already got Doxy earlier this month.   Ok to give azithromycin 500 mg on day 1 followed by 250 mg once daily on days 2 to 5. If symptoms are unimproved then she needs to come to see [place] or go to ED.

## 2014-11-03 NOTE — Telephone Encounter (Signed)
Called spoke with patient and advised of PM's recs as stated below.  Pt voiced her understanding and denied any questions/concerns.  Also recommended pt try Mucinex DM bid prn w/ plenty of fluids.  Pt will call back if symptoms do not improve or they worsen.  Rx sent to verified pharmacy  Nothing further needed; will sign off.

## 2014-11-03 NOTE — Telephone Encounter (Signed)
Spoke with pt, is requesting an abx be called in to pharmacy.   Pt had an acute visit scheduled for tomorrow for her s/s, but is unable to make this appt.  Rescheduled to 12/08/14.  Pt c/o prod cough with thick brown/yellow mucus.  Denies fever, chest pain.  S/s present on and off X1 year.  Pt uses [facility] on [address].    PM please advise if you're ok with sending an abx for this pt.  Thanks!

## 2014-11-04 ENCOUNTER — Ambulatory Visit: Payer: Medicaid Other | Admitting: Adult Health

## 2014-11-06 ENCOUNTER — Telehealth: Payer: Self-pay | Admitting: Pulmonary Disease

## 2014-11-06 MED ORDER — AMOXICILLIN-POT CLAVULANATE 875-125 MG PO TABS: 1.0000 | ORAL_TABLET | Freq: Two times a day (BID) | ORAL | Status: DC

## 2014-11-06 NOTE — Telephone Encounter (Signed)
Augmentin 875 mg every 12 hours for 5 days

## 2014-11-06 NOTE — Telephone Encounter (Signed)
Spoke with the pt  She states that the pharmacist advised they need further information from [place] before they will will rx for zithromax  I called [facility] and spoke with pharmacist  She states that pt's Medicaid requires PA  Dr. Vaughan Browner, please advise if you want to proceed with PA, or prescribe another ABX Note that the pt is intolerant to Levaquin only

## 2014-11-06 NOTE — Telephone Encounter (Signed)
Medication sent to pt's pharmacy  Left message for pt to call back to inform of medication change

## 2014-11-06 NOTE — Telephone Encounter (Signed)
Spoke with the pt and notified of recs per PM  She verbalized understanding

## 2014-11-12 ENCOUNTER — Encounter: Payer: Self-pay | Admitting: Cardiology

## 2014-11-13 ENCOUNTER — Telehealth: Payer: Self-pay | Admitting: Pulmonary Disease

## 2014-11-13 MED ORDER — HYDROCODONE-ACETAMINOPHEN 5-325 MG PO TABS: 1.0000 | ORAL_TABLET | Freq: Four times a day (QID) | ORAL | Status: DC | PRN

## 2014-11-13 MED ORDER — DOXYCYCLINE HYCLATE 100 MG PO TABS: 100.0000 mg | ORAL_TABLET | Freq: Two times a day (BID) | ORAL | Status: DC

## 2014-11-13 NOTE — Telephone Encounter (Signed)
Spoke with pt, was given Augmentin bid X5 days on 9/30 for cough, and is still having prod cough with thick yellow mucus. States the cough is worse at night and cannot sleep because of this.  Coughing until she wets herself.  Pt requesting a cough suppressant and another abx, (pt states doxycycline is the only thing that works for her cough).    Denies fever, chest pain.   [facility] on [address].    Sending to doc of day as PM is on vacation.  MW please advise.  Thanks!

## 2014-11-13 NOTE — Telephone Encounter (Signed)
Pt aware of recs.  Meds filled, pt picking up norco rx in office.  Nothing further needed.

## 2014-11-13 NOTE — Telephone Encounter (Signed)
Doxycycline 100 mg bid x 10 days   She has norco listed - this is a great med to take for cough ok to give #40 more if she needs

## 2014-11-16 ENCOUNTER — Emergency Department (HOSPITAL_COMMUNITY)
Admission: EM | Admit: 2014-11-16 | Discharge: 2014-11-17 | Disposition: A | Discharge status: Home / Self Care | Payer: Medicaid Other | Attending: Emergency Medicine | Admitting: Emergency Medicine

## 2014-11-16 ENCOUNTER — Emergency Department (HOSPITAL_COMMUNITY): Payer: Medicaid Other

## 2014-11-16 DIAGNOSIS — Z792 Long term (current) use of antibiotics: Secondary | ICD-10-CM | POA: Diagnosis not present

## 2014-11-16 DIAGNOSIS — J449 Chronic obstructive pulmonary disease, unspecified: Secondary | ICD-10-CM

## 2014-11-16 DIAGNOSIS — I10 Essential (primary) hypertension: Secondary | ICD-10-CM | POA: Insufficient documentation

## 2014-11-16 DIAGNOSIS — R51 Headache: Secondary | ICD-10-CM | POA: Diagnosis not present

## 2014-11-16 DIAGNOSIS — R079 Chest pain, unspecified: Secondary | ICD-10-CM | POA: Insufficient documentation

## 2014-11-16 DIAGNOSIS — E669 Obesity, unspecified: Secondary | ICD-10-CM | POA: Diagnosis not present

## 2014-11-16 DIAGNOSIS — Z7982 Long term (current) use of aspirin: Secondary | ICD-10-CM | POA: Insufficient documentation

## 2014-11-16 DIAGNOSIS — Z8669 Personal history of other diseases of the nervous system and sense organs: Secondary | ICD-10-CM | POA: Diagnosis not present

## 2014-11-16 DIAGNOSIS — Z79899 Other long term (current) drug therapy: Secondary | ICD-10-CM | POA: Diagnosis not present

## 2014-11-16 DIAGNOSIS — R6884 Jaw pain: Secondary | ICD-10-CM | POA: Diagnosis present

## 2014-11-16 DIAGNOSIS — J441 Chronic obstructive pulmonary disease with (acute) exacerbation: Secondary | ICD-10-CM | POA: Insufficient documentation

## 2014-11-16 LAB — I-STAT TROPONIN, ED: TROPONIN I, POC: 0 ng/mL (ref 0.00–0.08)

## 2014-11-16 LAB — COMPREHENSIVE METABOLIC PANEL
ALBUMIN: 3.5 g/dL (ref 3.5–5.0)
ALT: 19 U/L (ref 14–54)
ANION GAP: 13 (ref 5–15)
AST: 29 U/L (ref 15–41)
Alkaline Phosphatase: 67 U/L (ref 38–126)
BUN: 10 mg/dL (ref 6–20)
CHLORIDE: 97 mmol/L — AB (ref 101–111)
CO2: 25 mmol/L (ref 22–32)
Calcium: 8.5 mg/dL — ABNORMAL LOW (ref 8.9–10.3)
Creatinine, Ser: 0.81 mg/dL (ref 0.44–1.00)
GFR calc non Af Amer: >60 mL/min (ref >60)
GLUCOSE: 98 mg/dL (ref 65–99)
Potassium: 3.5 mmol/L (ref 3.5–5.1)
SODIUM: 135 mmol/L (ref 135–145)
Total Bilirubin: 0.5 mg/dL (ref 0.3–1.2)
Total Protein: 6.7 g/dL (ref 6.5–8.1)

## 2014-11-16 LAB — CBC
HCT: 40.1 % (ref 36.0–46.0)
Hemoglobin: 12.4 g/dL (ref 12.0–15.0)
MCH: 25.9 pg — AB (ref 26.0–34.0)
MCHC: 30.9 g/dL (ref 30.0–36.0)
MCV: 83.9 fL (ref 78.0–100.0)
PLATELETS: 311 10*3/uL (ref 150–400)
RBC: 4.78 MIL/uL (ref 3.87–5.11)
RDW: 14.8 % (ref 11.5–15.5)
WBC: 11.7 10*3/uL — ABNORMAL HIGH (ref 4.0–10.5)

## 2014-11-16 LAB — PROTIME-INR
INR: 1.08 (ref 0.00–1.49)
Prothrombin Time: 14.2 seconds (ref 11.6–15.2)

## 2014-11-16 LAB — APTT: APTT: 30 s (ref 24–37)

## 2014-11-16 MED ORDER — PREDNISONE 20 MG PO TABS
60.0000 mg | ORAL_TABLET | Freq: Once | ORAL | Status: AC
  Administered 2014-11-16: 60 mg via ORAL
  Filled 2014-11-16: qty 3

## 2014-11-16 MED ORDER — ASPIRIN 81 MG PO CHEW: 324.0000 mg | CHEWABLE_TABLET | Freq: Once | ORAL | Status: DC

## 2014-11-16 MED ORDER — IPRATROPIUM-ALBUTEROL 0.5-2.5 (3) MG/3ML IN SOLN
3.0000 mL | Freq: Once | RESPIRATORY_TRACT | Status: AC
  Administered 2014-11-16: 3 mL via RESPIRATORY_TRACT
  Filled 2014-11-16: qty 3

## 2014-11-16 MED ORDER — SODIUM CHLORIDE 0.9 % IV SOLN: 1000.0000 mL | INTRAVENOUS | Status: DC

## 2014-11-16 NOTE — ED Notes (Signed)
Pt receiving breathing tx at this time 

## 2014-11-16 NOTE — ED Notes (Signed)
Per [facility], pt from home for headache for the past several days and left sided jaw pain inconsistently for unknown amount of time. Pt had a 325 mg ASA under her tongue to dissolve on arrival of EMS and was told to swallow it. Pt refused IV access but states the pain is the same as when she had her last MI. Is noncompliant with amlodipine and hasn't had it in 2 weeks and states it is unnecessary because she takes all her other medications. Is also weaning off prednisone for PNA. NSR on monitor, rate of 86, BP 175/98.

## 2014-11-16 NOTE — ED Notes (Signed)
Pt ambulatory from stretcher to bathroom without any problems

## 2014-11-16 NOTE — ED Notes (Signed)
IV attempted x's 2 without relief

## 2014-11-16 NOTE — ED Provider Notes (Signed)
CSN: [id number]     Arrival date & time 11/16/14  2108 History   First MD Initiated Contact with Patient 11/16/14 2113     Chief Complaint  Patient presents with  . Jaw Pain  . Headache    HPI Patient presents to the emergency room for evaluation of intermittent jaw pain. Patient states she had a couple episodes of pain in her jaw today. One, the right side the next on the left. The pain radiated down into her chest. She took some aspirins today. Patient has had some shortness of breath. She also has been coughing and was told she had pneumonia recently. Patient states she has a history of MI however she has never had a cardiac catheterization and has never had stents. Her cardiac history is unclear. Past Medical History  Diagnosis Date  . Asthma   . Hypertension   . COPD (chronic obstructive pulmonary disease)     +/- asthma   . Obesity   . Manic, depressive   . Enlarged heart   . Fibromyalgia   . Morbid obesity   . OSA (obstructive sleep apnea)    No past surgical history on file. Family History  Problem Relation Age of Onset  . Cancer Mother    Social History  Substance Use Topics  . Smoking status: Former Smoker -- 2.00 packs/day for 15 years    Types: Cigarettes    Quit date: 05/24/2002  . Smokeless tobacco: Never Used  . Alcohol Use: No   OB History    Gravida Para Term Preterm AB TAB SAB Ectopic Multiple Living   1 1 1       1      Review of Systems  All other systems reviewed and are negative.     Allergies  Levaquin  Home Medications   Prior to Admission medications   Medication Sig Start Date End Date Taking? Authorizing Provider  albuterol (PROVENTIL) (2.5 MG/3ML) 0.083% nebulizer solution Take 3 mLs (2.5 mg total) by nebulization every 6 (six) hours as needed. 12/29/13  Yes Elsie Stain, MD  aspirin 325 MG tablet Take 325 mg by mouth daily.   Yes Historical Provider, MD  cloNIDine (CATAPRES) 0.3 MG tablet Take 1 tablet (0.3 mg total) by mouth 3  (three) times daily. 01/16/13  Yes Sueanne Margarita, MD  doxycycline (VIBRA-TABS) 100 MG tablet Take 1 tablet (100 mg total) by mouth 2 (two) times daily. 11/13/14  Yes Tanda Rockers, MD  furosemide (LASIX) 40 MG tablet Take 80 mg by mouth 2 (two) times daily. 05/18/11  Yes Lezlie Octave Black, NP  hydrochlorothiazide (HYDRODIURIL) 25 MG tablet Take 25 mg by mouth daily. 04/28/14  Yes Historical Provider, MD  HYDROcodone-acetaminophen (NORCO) 5-325 MG tablet Take 1 tablet by mouth every 6 (six) hours as needed. 11/13/14  Yes Tanda Rockers, MD  losartan (COZAAR) 100 MG tablet Take 1 tablet (100 mg total) by mouth daily. 04/22/13  Yes Sueanne Margarita, MD  metoprolol (LOPRESSOR) 100 MG tablet take 1 and 1/2 tablet by mouth twice a day   Yes Sueanne Margarita, MD  Phenyleph-Doxylamine-DM-APAP (NYQUIL SEVERE COLD/FLU) 5-6.25-10-325 MG/15ML LIQD Take 15 mLs by mouth as needed (for cough and congestion).   Yes Historical Provider, MD  predniSONE (DELTASONE) 10 MG tablet Take 4 tabs po x 2 days, then 2 x 2 days, then 1 x 2 days then stop. 05/22/14  Yes Tanda Rockers, MD  albuterol (PROVENTIL HFA;VENTOLIN HFA) 108 (90 BASE) MCG/ACT  inhaler Inhale 2 puffs into the lungs every 6 (six) hours as needed. wheezing Patient not taking: Reported on 11/16/2014 12/29/13   Elsie Stain, MD  amoxicillin-clavulanate (AUGMENTIN) 875-125 MG tablet Take 1 tablet by mouth every 12 (twelve) hours. For 5 days Patient not taking: Reported on 11/16/2014 11/06/14   Marshell Garfinkel, MD  azithromycin (ZITHROMAX) 250 MG tablet Take 2 today, then 1 daily until gone. Patient not taking: Reported on 11/16/2014 11/03/14   Marshell Garfinkel, MD  cyclobenzaprine (FLEXERIL) 10 MG tablet Take 1 tablet (10 mg total) by mouth 2 (two) times daily as needed for muscle spasms. Patient not taking: Reported on 11/16/2014 08/26/13   Tatyana Kirichenko, PA-C  doxycycline (VIBRA-TABS) 100 MG tablet Take 1 tablet (100 mg total) by mouth 2 (two) times daily. Patient not  taking: Reported on 11/16/2014 07/01/14   Elsie Stain, MD  doxycycline (VIBRA-TABS) 100 MG tablet Take 1 tablet (100 mg total) by mouth 2 (two) times daily. Patient not taking: Reported on 11/16/2014 10/09/14   Elsie Stain, MD  potassium chloride (K-DUR) 10 MEQ tablet Take 1 tablet (10 mEq total) by mouth daily. Patient not taking: Reported on 11/16/2014 05/06/14   Sueanne Margarita, MD  spironolactone (ALDACTONE) 25 MG tablet Take 1 tablet (25 mg total) by mouth daily. Patient not taking: Reported on 11/16/2014 12/29/13   Elsie Stain, MD   BP 159/84 mmHg  Pulse 84  Temp(Src) 97.9 F (36.6 C) (Temporal)  Resp 25  SpO2 99%  LMP 11/03/2014 Physical Exam  Constitutional: No distress.  Morbidly obese  HENT:  Head: Normocephalic and atraumatic.  Right Ear: External ear normal.  Left Ear: External ear normal.  No jaw malocclusion, no dental tenderness, no submandibular swelling  Eyes: Conjunctivae are normal. Right eye exhibits no discharge. Left eye exhibits no discharge. No scleral icterus.  Neck: Neck supple. No tracheal deviation present.  Cardiovascular: Normal rate, regular rhythm and intact distal pulses.   Pulmonary/Chest: Effort normal. No stridor. No respiratory distress. She has wheezes. She has no rales.  Abdominal: Soft. Bowel sounds are normal. She exhibits no distension. There is no tenderness. There is no rebound and no guarding.  Musculoskeletal: She exhibits edema. She exhibits no tenderness.  Neurological: She is alert. She has normal strength. No cranial nerve deficit (no facial droop, extraocular movements intact, no slurred speech) or sensory deficit. She exhibits normal muscle tone. She displays no seizure activity. Coordination normal.  Skin: Skin is warm and dry. No rash noted.  Psychiatric: She has a normal mood and affect.  Nursing note and vitals reviewed.   ED Course  Procedures (including critical care time) Labs Review Labs Reviewed  CBC -  Abnormal; Notable for the following:    WBC 11.7 (*)    MCH 25.9 (*)    All other components within normal limits  COMPREHENSIVE METABOLIC PANEL - Abnormal; Notable for the following:    Chloride 97 (*)    Calcium 8.5 (*)    All other components within normal limits  APTT  PROTIME-INR  I-STAT TROPOININ, ED    Imaging Review Dg Chest 2 View  11/16/2014   CLINICAL DATA:  Acute shortness of breath.  EXAM: CHEST  2 VIEW  COMPARISON:  08/26/2013 and prior radiographs  FINDINGS: Upper limits normal heart size and peribronchial thickening again noted.  Mild elevation of the right hemidiaphragm is again identified.  There is no evidence of focal airspace disease, pulmonary edema, suspicious pulmonary nodule/mass, pleural effusion,  or pneumothorax. No acute bony abnormalities are identified.  IMPRESSION: No evidence of acute cardiopulmonary disease.  Mild chronic peribronchial thickening.   Electronically Signed   By: Margarette Canada M.D.   On: 11/16/2014 22:30   I have personally reviewed and evaluated these images and lab results as part of my medical decision-making.   EKG Interpretation   Date/Time:  Monday November 16 2014 21:20:02 EDT Ventricular Rate:  84 PR Interval:  173 QRS Duration: 94 QT Interval:  381 QTC Calculation: 450 R Axis:   60 Text Interpretation:  Sinus rhythm Baseline wander in lead(s) II III aVR  aVF Confirmed by Olukemi Panchal  MD-J, Davius Goudeau ([id number]) on 11/16/2014 9:28:12 PM      MDM   Final diagnoses:  Chest pain, unspecified chest pain type  Chronic obstructive pulmonary disease, unspecified COPD type ([place])    Pt does not have a history of CAD according to the notes by Dr Radford Pax.  Labs and EKG are reassuring.  No pna noted on cxr.  Pt has severe morbid obesity and COPD.  Treated nebulizer and steroid dose here.  Doubt symptoms are cardiac related.  Will dc home.  Continue her current med regimen.  Follow up with her cardiologist.    Dorie Rank, MD 11/17/14 0002

## 2014-11-17 MED ORDER — ALBUTEROL SULFATE (2.5 MG/3ML) 0.083% IN NEBU: 2.5000 mg | INHALATION_SOLUTION | Freq: Four times a day (QID) | RESPIRATORY_TRACT | Status: DC | PRN

## 2014-12-02 ENCOUNTER — Telehealth: Payer: Self-pay | Admitting: Pulmonary Disease

## 2014-12-02 NOTE — Telephone Encounter (Signed)
Msg printed and given to team leader

## 2014-12-02 NOTE — Telephone Encounter (Signed)
Spoke with the pharmacist at [facility]  She states that pt tried to get zpack rx filled, but Dr. Vaughan Browner is still not affiliated with medicaid and therefore rx not covered  I see that we recently gave her augmentin (MW signed rx)  Called pt to see why she still needed abx  She states that augmentin helped only minimally  She is still coughing up dark brown sputum  She is still having minimal SOB  I have scheduled her to see TP on 12/04/14 (this is the soonest she could come in)    Will hold to discuss PM's medicaid issue

## 2014-12-04 ENCOUNTER — Ambulatory Visit: Payer: Medicaid Other | Admitting: Adult Health

## 2014-12-07 ENCOUNTER — Encounter: Payer: Self-pay | Admitting: Cardiology

## 2014-12-08 ENCOUNTER — Ambulatory Visit: Payer: Medicaid Other | Admitting: Pulmonary Disease

## 2015-01-01 ENCOUNTER — Ambulatory Visit: Payer: Medicaid Other | Admitting: Cardiology

## 2015-01-05 ENCOUNTER — Ambulatory Visit: Payer: Medicaid Other | Admitting: Pulmonary Disease

## 2015-01-21 ENCOUNTER — Ambulatory Visit (INDEPENDENT_AMBULATORY_CARE_PROVIDER_SITE_OTHER): Payer: Medicaid Other | Admitting: Cardiology

## 2015-01-21 ENCOUNTER — Encounter: Payer: Self-pay | Admitting: Cardiology

## 2015-01-21 VITALS — BP 140/100 | HR 87 | Ht 64.0 in | Wt >= 6400 oz

## 2015-01-21 DIAGNOSIS — I5032 Chronic diastolic (congestive) heart failure: Secondary | ICD-10-CM

## 2015-01-21 DIAGNOSIS — G4733 Obstructive sleep apnea (adult) (pediatric): Secondary | ICD-10-CM | POA: Diagnosis not present

## 2015-01-21 DIAGNOSIS — R0602 Shortness of breath: Secondary | ICD-10-CM

## 2015-01-21 DIAGNOSIS — I10 Essential (primary) hypertension: Secondary | ICD-10-CM

## 2015-01-21 MED ORDER — LISINOPRIL 20 MG PO TABS: 20.0000 mg | ORAL_TABLET | Freq: Every day | ORAL | Status: DC

## 2015-01-21 NOTE — Progress Notes (Signed)
Cardiology Office Note   Date:  01/21/2015   ID:  Jenna Chandler, DOB 04/07/1965, MRN [id number]  PCP:  Jenna Fendt, MD    Chief Complaint  Patient presents with  . Congestive Heart Failure  . Hypertension      History of Present Illness: Jenna Chandler is a 49 y.o. female with a history of chronic diastolic CHF, HTN, morbid obesity and OSA on CPAP who presents for followup. She is doing well. She denies any exertional chest pain, dizziness, palpitations or syncope. She has extreme DOE with walking but has morbid obesity. She uses O2 PRN and is followed by [facility].  She feels a lot more rested in the am since going on CPAP. She dose not have significant daytime sleepiness. She does still snore some.She has problems with dry mouth. She uses the full face mask and feels the pressure is ok.      Past Medical History  Diagnosis Date  . Asthma   . Hypertension   . COPD (chronic obstructive pulmonary disease) ([name])     +/- asthma   . Obesity   . Manic, depressive ([place])   . Enlarged heart   . Fibromyalgia   . Morbid obesity ([place])   . OSA (obstructive sleep apnea)     Past Surgical History  Procedure Laterality Date  . No past surgeries       Current Outpatient Prescriptions  Medication Sig Dispense Refill  . albuterol (PROVENTIL HFA;VENTOLIN HFA) 108 (90 BASE) MCG/ACT inhaler Inhale 2 puffs into the lungs every 6 (six) hours as needed. wheezing 18 g 4  . albuterol (PROVENTIL) (2.5 MG/3ML) 0.083% nebulizer solution Take 3 mLs (2.5 mg total) by nebulization every 6 (six) hours as needed. 75 mL 6  . ALPRAZolam (XANAX) 1 MG tablet Take 1 mg by mouth daily.  0  . amLODipine (NORVASC) 10 MG tablet Take 10 mg by mouth 2 (two) times daily.  0  . aspirin 325 MG tablet Take 325 mg by mouth daily.    . cloNIDine (CATAPRES) 0.3 MG tablet Take 1 tablet (0.3 mg total) by mouth 3 (three) times daily. 90 tablet 11  . cyclobenzaprine (FLEXERIL) 10  MG tablet Take 1 tablet (10 mg total) by mouth 2 (two) times daily as needed for muscle spasms. 20 tablet 0  . furosemide (LASIX) 40 MG tablet Take 80 mg by mouth 2 (two) times daily.    Marland Kitchen gabapentin (NEURONTIN) 300 MG capsule Take 300 mg by mouth daily as needed. AS NEEDED FOR PAIN  0  . hydrochlorothiazide (HYDRODIURIL) 25 MG tablet Take 25 mg by mouth daily.  0  . HYDROcodone-acetaminophen (NORCO) 5-325 MG tablet Take 1 tablet by mouth every 6 (six) hours as needed. 40 tablet 0  . ibuprofen (ADVIL,MOTRIN) 800 MG tablet Take 800 mg by mouth every 6 (six) hours as needed. AS NEEDED FOR MODERATE PAIN  0  . Vitamin D, Ergocalciferol, (DRISDOL) 50000 UNITS CAPS capsule Take 50,000 Units by mouth once a week. EVERY 7 DAYS  0   No current facility-administered medications for this visit.    Allergies:   Levaquin    Social History:  The patient  reports that she quit smoking about 12 years ago. Her smoking use included Cigarettes. She has a 30 pack-year smoking history. She has never used smokeless tobacco. She reports that she does not drink alcohol or use  illicit drugs.   Family History:  The patient's family history includes Cancer in her mother.    ROS:  Please see the history of present illness.   Otherwise, review of systems are positive for none.   All other systems are reviewed and negative.    PHYSICAL EXAM: VS:  BP 140/100 mmHg  Pulse 87  Ht 5\' 4"  (1.626 m)  Wt 444 lb 3.2 oz (201.488 kg)  BMI 76.21 kg/m2  SpO2 94% , BMI Body mass index is 76.21 kg/(m^2). GEN: Well nourished, well developed, in no acute distress HEENT: normal Neck: no JVD, carotid bruits, or masses Cardiac: RRR; no murmurs, rubs, or gallops.  1+ LE edema  Respiratory:  clear to auscultation bilaterally, normal work of breathing GI: soft, nontender, nondistended, + BS MS: no deformity or atrophy Skin: warm and dry, no rash Neuro:  Strength and sensation are intact Psych: euthymic mood, full affect   EKG:   EKG is not ordered today.   Recent Labs: 11/16/2014: ALT 19; BUN 10; Creatinine, Ser 0.81; Hemoglobin 12.4; Platelets 311; Potassium 3.5; Sodium 135    Lipid Panel    Component Value Date/Time   CHOL 152 03/04/2008 2146   TRIG 103 03/04/2008 2146   HDL 56 03/04/2008 2146   CHOLHDL 2.7 Ratio 03/04/2008 2146   VLDL 21 03/04/2008 2146   LDLCALC 75 03/04/2008 2146      Wt Readings from Last 3 Encounters:  01/21/15 444 lb 3.2 oz (201.488 kg)  05/05/14 414 lb 3.2 oz (187.88 kg)  12/29/13 419 lb 9.6 oz (190.329 kg)      ASSESSMENT AND PLAN:  1. Chronic diastolic CHF - weight is up 30lbs since Last march but does not appear to be from volume overload.  - continue lasix /HCTZ 2. HTN - poorly controlled. No evidence of RAS on Abdominal CTA. Urine catechols normal.  Meds were changed due to being on Medicaid.   - start Lisinopril 20mg  daily and followup in HTN clinic in 2 week. This is $4 at [facility] for 1 month supply - Amlodipine/Clonidine/HCTZ 3. Morbid obesity - she very frustrated with her weight and was teary eyed in the office today.  She has been exercising at [employer] but has gained weight.  I will refer her to one of the info sessions for weight loss surgery. 4. Chest pain with typical and atypical components. She cancelled her  2 day Lexiscan myoview last spring but now CP has resolved.  No further w/u at this time since now she is over the weight limit to assess for ischemia with any modality.  5. Severe DOE most likely related to morbid obesity which is stable.   6. OSA on CPAP and tolerating well. I will check a download from DME  Current medicines are reviewed at length with the patient today.  The patient does not have concerns regarding medicines.  The following changes have been made:  Start Lisinopril 20mg  daily  Labs/ tests ordered today: See above Assessment and Plan No orders of the defined types were placed in this encounter.      Disposition:   FU with me in 6 months  Signed, Sueanne Margarita, MD  01/21/2015 2:14 PM    [facility] [address], [place], [place]  [address] Phone: [phone]; Fax: [phone]

## 2015-01-21 NOTE — Patient Instructions (Addendum)
Medication Instructions:  Your physician has recommended you make the following change in your medication:  1) START LISINOPRIL 20 mg daily  Labwork: None  Testing/Procedures: None  Follow-Up: Your physician recommends that you schedule a follow-up appointment with Gay Filler, [facility] in the HTN CLINIC in 2 weeks.  Your physician wants you to follow-up in: 6 months with Dr. Radford Pax. You will receive a reminder letter in the mail two months in advance. If you don't receive a letter, please call our office to schedule the follow-up appointment.   Any Other Special Instructions Will Be Listed Below (If Applicable). Bariatric Surgery You have so much to gain by losing weight.  You may have already tried every diet and exercise plan imaginable.  And, you may have sought advice from your family physician, too.   Sometimes, in spite of such diligent efforts, you may not be able to achieve long-term results by yourself.  In cases of severe obesity, bariatric or weight loss surgery is a proven method of achieving long-term weight control.  Our Services Our bariatric surgery programs offer our patients new hope and long-term weight-loss solution.  Since introducing our services in 2003, we have conducted more than 2,400 successful procedures.  Our program is designated as a Programmer, multimedia by the Metabolic and Bariatric Surgery Accreditation and Quality Improvement Program (MBSAQIP), a IT trainer that sets rigorous patient safety and outcome standards.  Our program is also designated as a Ecologist by [employer].   Our exceptional weight-loss surgery team specializes in diagnosis, treatment, follow-up care, and ongoing support for our patients with severe weight loss challenges.  We currently offer laparoscopic sleeve gastrectomy, gastric bypass, and adjustable gastric band (LAP-BAND).    Attend our [place] Choosing to undergo a bariatric procedure is a  big decision, and one that should not be taken lightly.  You now have two options in how you learn about weight-loss surgery - in person or online.  Our objective is to ensure you have all of the information that you need to evaluate the advantages and obligations of this life changing procedure.  Please note that you are not alone in this process, and our experienced team is ready to assist and answer all of your questions.  There are several ways to register for a seminar (either on-line or in person): 1)  Call [phone] 2) Go on-line to [facility] and register for either type of seminar.  [email or web address]    If you need a refill on your cardiac medications before your next appointment, please call your pharmacy.

## 2015-01-30 ENCOUNTER — Other Ambulatory Visit: Payer: Self-pay | Admitting: Critical Care Medicine

## 2015-02-03 NOTE — Addendum Note (Signed)
Addended by: Eulis Foster on: 02/03/2015 04:08 PM   Modules accepted: Orders

## 2015-02-03 NOTE — Addendum Note (Signed)
Addended by: Eulis Foster on: 02/03/2015 04:09 PM   Modules accepted: Orders

## 2015-02-04 ENCOUNTER — Other Ambulatory Visit (INDEPENDENT_AMBULATORY_CARE_PROVIDER_SITE_OTHER): Payer: Medicaid Other

## 2015-02-04 ENCOUNTER — Ambulatory Visit: Payer: Medicaid Other | Admitting: Pharmacist

## 2015-02-04 DIAGNOSIS — I10 Essential (primary) hypertension: Secondary | ICD-10-CM | POA: Diagnosis not present

## 2015-02-19 ENCOUNTER — Encounter: Payer: Self-pay | Admitting: Pulmonary Disease

## 2015-02-19 ENCOUNTER — Ambulatory Visit (INDEPENDENT_AMBULATORY_CARE_PROVIDER_SITE_OTHER): Payer: Medicaid Other | Admitting: Pulmonary Disease

## 2015-02-19 VITALS — HR 80 | Ht 64.0 in | Wt >= 6400 oz

## 2015-02-19 DIAGNOSIS — J449 Chronic obstructive pulmonary disease, unspecified: Secondary | ICD-10-CM | POA: Diagnosis not present

## 2015-02-19 MED ORDER — HYDROCODONE-ACETAMINOPHEN 5-325 MG PO TABS: 1.0000 | ORAL_TABLET | Freq: Four times a day (QID) | ORAL | Status: DC | PRN

## 2015-02-19 MED ORDER — FLUTICASONE-SALMETEROL 250-50 MCG/DOSE IN AEPB: 1.0000 | INHALATION_SPRAY | Freq: Two times a day (BID) | RESPIRATORY_TRACT | Status: DC

## 2015-02-19 MED ORDER — AZITHROMYCIN 250 MG PO TABS: ORAL_TABLET | ORAL | Status: DC

## 2015-02-19 NOTE — Patient Instructions (Addendum)
We will start you on Advair 250/50. Z pack, Chest X ray.  PFTs when resp status is stable

## 2015-02-19 NOTE — Progress Notes (Signed)
Subjective:    Patient ID: Jenna Chandler, female    DOB: 05/30/1965, 50 y.o.   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 Chronic bronchitis. Morbid obesity  HPI Ms Moishe Chandler is here for follow-up of her COPD,chronic bronchitis. She is a former patient of Dr. Joya Gaskins. She associates with morbid obesity, COPD, bronchitis. She has recurrent attacks of bronchitis and needs to go on antibiotics frequently. She was last given multiple courses of antibiotic over Fall season including doxycycline, Z-Pak.  Today she states that her dyspnea is worsened over last few weeks with increasing cough, dark sputum production. She has some chills but no objective fevers. She has OSA and is on CPAP. She uses this every day. She is on O2 at night to the CPAP and also during the daytime on exertion.  She smoked for about 30-pack-year. She quit in 2004.  DATA: PFTs 08/14/11  FVC 1.42 (42%) FEV1 1.06 (41%) F/F 74 FEF 25-75 percent 0.86 [29%] TLC 57% DLCO 36% Minimal obstruction, small airway disease as shown by reduction in mid flow rates. There is a significant response admitted for [facility] bronchial dilator response. Moderate restriction and severe DLCO production. DLCO corrects for alveolar volume.  CXR 11/16/14 No acute cardiopulmonary abnormality.  Past Medical History  Diagnosis Date  . Asthma   . Hypertension   . COPD (chronic obstructive pulmonary disease) ([name])     +/- asthma   . Obesity   . Manic, depressive ([place])   . Enlarged heart   . Fibromyalgia   . Morbid obesity ([place])   . OSA (obstructive sleep apnea)     Current outpatient prescriptions:  .  albuterol (PROVENTIL HFA;VENTOLIN HFA) 108 (90 BASE) MCG/ACT inhaler, Inhale 2 puffs into the lungs every 6 (six) hours as needed. wheezing, Disp: 18 g, Rfl: 4 .  albuterol (PROVENTIL) (2.5 MG/3ML) 0.083% nebulizer solution, Take 3 mLs (2.5 mg total) by nebulization every 6 (six) hours as needed., Disp: 75 mL,  Rfl: 6 .  ALPRAZolam (XANAX) 1 MG tablet, Take 1 mg by mouth daily., Disp: , Rfl: 0 .  amLODipine (NORVASC) 10 MG tablet, Take 10 mg by mouth 2 (two) times daily., Disp: , Rfl: 0 .  aspirin 325 MG tablet, Take 325 mg by mouth daily., Disp: , Rfl:  .  cloNIDine (CATAPRES) 0.3 MG tablet, Take 1 tablet (0.3 mg total) by mouth 3 (three) times daily., Disp: 90 tablet, Rfl: 11 .  cyclobenzaprine (FLEXERIL) 10 MG tablet, Take 1 tablet (10 mg total) by mouth 2 (two) times daily as needed for muscle spasms., Disp: 20 tablet, Rfl: 0 .  furosemide (LASIX) 40 MG tablet, Take 80 mg by mouth 2 (two) times daily., Disp: , Rfl:  .  gabapentin (NEURONTIN) 300 MG capsule, Take 300 mg by mouth daily as needed. AS NEEDED FOR PAIN, Disp: , Rfl: 0 .  hydrochlorothiazide (HYDRODIURIL) 25 MG tablet, Take 25 mg by mouth daily., Disp: , Rfl: 0 .  HYDROcodone-acetaminophen (NORCO) 5-325 MG tablet, Take 1 tablet by mouth every 6 (six) hours as needed., Disp: 40 tablet, Rfl: 0 .  ibuprofen (ADVIL,MOTRIN) 800 MG tablet, Take 800 mg by mouth every 6 (six) hours as needed. AS NEEDED FOR MODERATE PAIN, Disp: , Rfl: 0 .  lisinopril (PRINIVIL,ZESTRIL) 20 MG tablet, Take 1 tablet (20 mg total) by mouth daily., Disp: 30 tablet, Rfl: 11 .  Vitamin D, Ergocalciferol, (DRISDOL) 50000 UNITS CAPS capsule, Take 50,000 Units by mouth once a  week. EVERY 7 DAYS, Disp: , Rfl: 0   Review of Systems Dyspnea on exertion, cough, sputum production. No fevers, chills, wheezing. No chest pain, palpitations. No nausea, vomiting, diarrhea, constipation. All other review of systems are negative    Objective:   Physical Exam Pulse 80, height 5\' 4"  (1.626 m), weight 444 lb 9.6 oz (201.669 kg), SpO2 98 %. Gen: No apparent distress Neuro: No gross focal deficits. Neck: No JVD, lymphadenopathy, thyromegaly. RS: Clear no wheeze, crackles. CVS: S1-S2 heard, no murmurs rubs gallops. Abdomen: Soft, positive bowel sounds. Extremities: No edema.      Assessment & Plan:  Jenna Chandler continues to have issues with COPD, chronic bronchitis, recurrent infections. I suspect that her obesity is a major contributor to his symptoms. She is only on albuterol inhaler. It is not clear to me why she is not on a long-term controller medication. I'll start her on Advair 250/50 and get a more recent set of PFTs to evaluate her lung function.  She was walked in the office today and did not desat. Hence we will hold the ambulatory O2. She'll continue the nocturnal oxygen and CPAP. I prescribed her a course of Z-Pak and a chest x-ray for the most recent episode of bronchitis.  She is asking for refill on the Norco pain medication. I will give her enough to cover her until she sees her primary care physician.  PLAN: - Start Advair 250/50. - Z-Pak, chest x-ray - Hold ambulatory O2, continue nocturnal O2 and CPAP. - PFTs  Marshell Garfinkel MD [facility] Pager [phone] If no answer or after 3pm call: [phone] 02/19/2015, 3:39 PM

## 2015-03-23 ENCOUNTER — Other Ambulatory Visit: Payer: Self-pay | Admitting: Critical Care Medicine

## 2015-04-14 ENCOUNTER — Ambulatory Visit: Payer: Medicaid Other | Admitting: Pulmonary Disease

## 2015-04-27 ENCOUNTER — Other Ambulatory Visit: Payer: Medicaid Other

## 2015-06-15 ENCOUNTER — Other Ambulatory Visit: Payer: Self-pay | Admitting: Internal Medicine

## 2015-06-15 DIAGNOSIS — N63 Unspecified lump in unspecified breast: Secondary | ICD-10-CM

## 2015-07-15 ENCOUNTER — Encounter: Payer: Self-pay | Admitting: Cardiology

## 2015-07-16 ENCOUNTER — Telehealth: Payer: Self-pay | Admitting: Pulmonary Disease

## 2015-07-16 MED ORDER — AZITHROMYCIN 250 MG PO TABS: ORAL_TABLET | ORAL | Status: DC

## 2015-07-16 NOTE — Telephone Encounter (Signed)
Spoke with pt and she c/o cough with yellow/brown mucus, SOB and some wheeze. She has taken nyquil and chloraseptic, albuterol hfa with some symptom relief. She states that the abx she was given at [facility] "worked great" and she would like to take that again.   PM Please advise. Thanks!

## 2015-07-16 NOTE — Telephone Encounter (Signed)
abx refilled. Pt aware.  Nothing further needed.

## 2015-07-16 NOTE — Telephone Encounter (Signed)
Ok to order Z pak. 500 mg on day one and then 250 mg for 4 more days

## 2015-07-16 NOTE — Telephone Encounter (Signed)
Patient called and checking status of antibiotic -prm

## 2015-07-20 ENCOUNTER — Ambulatory Visit: Payer: Medicaid Other | Admitting: Cardiology

## 2015-07-22 ENCOUNTER — Telehealth: Payer: Self-pay | Admitting: Pulmonary Disease

## 2015-07-22 MED ORDER — AMOXICILLIN-POT CLAVULANATE 875-125 MG PO TABS: 1.0000 | ORAL_TABLET | Freq: Two times a day (BID) | ORAL | Status: DC

## 2015-07-22 MED ORDER — PREDNISONE 10 MG PO TABS: ORAL_TABLET | ORAL | Status: DC

## 2015-07-22 NOTE — Telephone Encounter (Signed)
Send prescription for levaquin 500 mg daily for 7 days. Pred at 40 mg. Taper by 10 mg every 3 days.  Mucinex for cough If there is no improvement then she will need to be seen in clinic or ED

## 2015-07-22 NOTE — Telephone Encounter (Signed)
rx sent to pharmacy.  Pt aware.  Nothing further needed.  

## 2015-07-22 NOTE — Telephone Encounter (Signed)
Please call augmentin 875 mg q12 for 7 days

## 2015-07-22 NOTE — Telephone Encounter (Signed)
Spoke with pt, states that she finished the zpak given to her by PM last week, states she has had minimal improvement- states her cough is productive with yellow mucus- cough is so severe that pt is frequently voiding on herself.  Pt is unable to control this and because of this refused an appt.  Pt also notes chest tightness and wheezing with her cough.  Pt is requesting something else be called in for her.   [facility] uses [facility] on [address].   PM please advise on recs.  Thanks!

## 2015-07-22 NOTE — Telephone Encounter (Signed)
Spoke with the pt  She states that she is allergic to Levaquin- causes nasuea  This is her only drug allergy  I went ahead and send pred  Please advise on levaquin alternative

## 2015-07-28 ENCOUNTER — Other Ambulatory Visit: Payer: Self-pay | Admitting: Internal Medicine

## 2015-07-28 DIAGNOSIS — N63 Unspecified lump in unspecified breast: Secondary | ICD-10-CM

## 2015-08-03 ENCOUNTER — Other Ambulatory Visit: Payer: Self-pay | Admitting: Internal Medicine

## 2015-08-03 ENCOUNTER — Ambulatory Visit
Admission: RE | Admit: 2015-08-03 | Discharge: 2015-08-03 | Disposition: A | Discharge status: Home / Self Care | Payer: Medicaid Other | Source: Ambulatory Visit | Attending: Internal Medicine | Admitting: Internal Medicine

## 2015-08-03 DIAGNOSIS — N63 Unspecified lump in unspecified breast: Secondary | ICD-10-CM

## 2015-09-28 ENCOUNTER — Other Ambulatory Visit: Payer: Self-pay | Admitting: Pulmonary Disease

## 2015-10-04 ENCOUNTER — Ambulatory Visit: Payer: Medicaid Other | Admitting: Pulmonary Disease

## 2015-10-18 ENCOUNTER — Ambulatory Visit: Payer: Medicaid Other | Admitting: Pulmonary Disease

## 2015-10-21 ENCOUNTER — Ambulatory Visit: Payer: Medicaid Other | Admitting: Pulmonary Disease

## 2015-10-26 ENCOUNTER — Encounter (HOSPITAL_COMMUNITY): Payer: Self-pay | Admitting: *Deleted

## 2015-10-26 ENCOUNTER — Ambulatory Visit (HOSPITAL_COMMUNITY)
Admission: EM | Admit: 2015-10-26 | Discharge: 2015-10-26 | Disposition: A | Discharge status: Home / Self Care | Payer: Medicaid Other | Attending: Emergency Medicine | Admitting: Emergency Medicine

## 2015-10-26 DIAGNOSIS — I159 Secondary hypertension, unspecified: Secondary | ICD-10-CM

## 2015-10-26 DIAGNOSIS — L720 Epidermal cyst: Secondary | ICD-10-CM | POA: Diagnosis not present

## 2015-10-26 MED ORDER — DOXYCYCLINE HYCLATE 100 MG PO CAPS: 100.0000 mg | ORAL_CAPSULE | Freq: Two times a day (BID) | ORAL | 0 refills | Status: DC

## 2015-10-26 NOTE — ED Triage Notes (Signed)
Pt  Has  A  Boil  On l  Upper  Chest          X  5 days  She tried  To  Squeeze  It        But only  Slight  Amount   Of  Pus  Came  Out

## 2015-10-26 NOTE — Discharge Instructions (Signed)
Decrease your salt intake. diet and exercise will lower your blood pressure significantly. Get a blood pressure machine today and keep a log of your blood pressure for the next few days. You may need to see Dr. Warren Danes if it stays elevated consistently. Take the log with you.  It is important to keep your blood pressure under good control, as having a elevated for prolonged periods of time significantly increases your risk of stroke, heart attacks, kidney damage, eye damage, and other problems. Return immediately to the ER if you start having chest pain, headache, problems seeing, problems talking, problems walking, if you feel like you're about to pass out, if you do pass out, if you have a seizure, or for any other concerns.

## 2015-10-26 NOTE — ED Provider Notes (Signed)
HPI  SUBJECTIVE:  Jenna Chandler is a 50 y.o. female who presents with a hyper pigmented mass on her left upper breast present for the past several months. She states that it is getting bigger for the past 5-6 days. Reports soreness with palpation but states it is not bothering her at any other time. States that she has been squeezing it for the past 5 or 6 days and getting watery pus out of it. She states that she feels feverish, but has no documented fevers. No chills. She denies any blows elsewhere. She has tried squeezing it, hot compresses, vinegar, ibuprofen, aspirin. Symptoms are better with hot compresses and vinegar. Worse with palpation. She has taken an antipyretic in the past 6-8 hours. She denies any trauma, insect bite, erythema. She states it does feel warmer than the surrounding skin. She has a past medical history of hypertension, on multiple medications for this. States that she is compliant with her medications, but has only taken 2 of them today, COPD, asthma, file nausea, morbid obesity. No history of diabetes, MRSA, artificial heart valves, artificial joints, HIV. PMD: Dr. Warren Danes   Past Medical History:  Diagnosis Date  . Asthma   . COPD (chronic obstructive pulmonary disease) ([name])    +/- asthma   . Enlarged heart   . Fibromyalgia   . Hypertension   . Manic, depressive ([place])   . Morbid obesity ([place])   . Obesity   . OSA (obstructive sleep apnea)     Past Surgical History:  Procedure Laterality Date  . NO PAST SURGERIES      Family History  Problem Relation Age of Onset  . Cancer Mother     Social History  Substance Use Topics  . Smoking status: Former Smoker    Packs/day: 2.00    Years: 15.00    Types: Cigarettes    Quit date: 05/24/2002  . Smokeless tobacco: Never Used  . Alcohol use No    No current facility-administered medications for this encounter.   Current Outpatient Prescriptions:  .  ADVAIR DISKUS 250-50 MCG/DOSE AEPB, inhale 1 dose by  mouth twice a day, Disp: 60 each, Rfl: 5 .  albuterol (PROVENTIL HFA;VENTOLIN HFA) 108 (90 BASE) MCG/ACT inhaler, Inhale 2 puffs into the lungs every 6 (six) hours as needed. wheezing, Disp: 18 g, Rfl: 4 .  albuterol (PROVENTIL) (2.5 MG/3ML) 0.083% nebulizer solution, Take 3 mLs (2.5 mg total) by nebulization every 6 (six) hours as needed., Disp: 75 mL, Rfl: 6 .  albuterol (PROVENTIL) (2.5 MG/3ML) 0.083% nebulizer solution, inhale contents of 1 vial in nebulizer every 6 hours if needed, Disp: 75 vial, Rfl: 6 .  ALPRAZolam (XANAX) 1 MG tablet, Take 1 mg by mouth daily., Disp: , Rfl: 0 .  amLODipine (NORVASC) 10 MG tablet, Take 10 mg by mouth 2 (two) times daily., Disp: , Rfl: 0 .  aspirin 325 MG tablet, Take 325 mg by mouth daily., Disp: , Rfl:  .  cloNIDine (CATAPRES) 0.3 MG tablet, Take 1 tablet (0.3 mg total) by mouth 3 (three) times daily., Disp: 90 tablet, Rfl: 11 .  cyclobenzaprine (FLEXERIL) 10 MG tablet, Take 1 tablet (10 mg total) by mouth 2 (two) times daily as needed for muscle spasms., Disp: 20 tablet, Rfl: 0 .  doxycycline (VIBRAMYCIN) 100 MG capsule, Take 1 capsule (100 mg total) by mouth 2 (two) times daily., Disp: 20 capsule, Rfl: 0 .  furosemide (LASIX) 40 MG tablet, Take 80 mg by mouth 2 (two) times  daily., Disp: , Rfl:  .  gabapentin (NEURONTIN) 300 MG capsule, Take 300 mg by mouth daily as needed. AS NEEDED FOR PAIN, Disp: , Rfl: 0 .  hydrochlorothiazide (HYDRODIURIL) 25 MG tablet, Take 25 mg by mouth daily., Disp: , Rfl: 0 .  HYDROcodone-acetaminophen (NORCO) 5-325 MG tablet, Take 1 tablet by mouth every 6 (six) hours as needed., Disp: 40 tablet, Rfl: 0 .  ibuprofen (ADVIL,MOTRIN) 800 MG tablet, Take 800 mg by mouth every 6 (six) hours as needed. AS NEEDED FOR MODERATE PAIN, Disp: , Rfl: 0 .  lisinopril (PRINIVIL,ZESTRIL) 20 MG tablet, Take 1 tablet (20 mg total) by mouth daily., Disp: 30 tablet, Rfl: 11 .  Vitamin D, Ergocalciferol, (DRISDOL) 50000 UNITS CAPS capsule, Take 50,000  Units by mouth once a week. EVERY 7 DAYS, Disp: , Rfl: 0  Allergies  Allergen Reactions  . Levaquin [Levofloxacin]     Nauseated      ROS  As noted in HPI.   Physical Exam  BP (!) 173/109 (BP Location: Left Arm)   Pulse 80   Temp 97.5 F (36.4 C) (Oral)   Resp 22   SpO2 96%   BP Readings from Last 3 Encounters:  10/26/15 (!) 173/109  01/21/15 (!) 140/100  11/16/14 159/84   Constitutional: Well developed, well nourished, no acute distress Eyes:  EOMI, conjunctiva normal bilaterally HENT: Normocephalic, atraumatic,mucus membranes moist Respiratory: Normal inspiratory effort Cardiovascular: Normal rate GI: nondistended skin: 3 x 3 hyperpigmented, firm area with central dark lesion on left upper breast with no expressible purulent drainage. No erythema, swelling. Musculoskeletal: no deformities Neurologic: Alert & oriented x 3, no focal neuro deficits. CN 2 through 12 grossly intact.  Psychiatric: Speech and behavior appropriate   ED Course   Medications - No data to display  Orders Placed This Encounter  Procedures  . Ambulatory referral to General Surgery    Referral Priority:   Routine    Referral Type:   Surgical    Referral Reason:   Specialty Services Required    Requested Specialty:   General Surgery    Number of Visits Requested:   1  . Ambulatory referral to Dermatology    Referral Priority:   Routine    Referral Type:   Consultation    Referral Reason:   Specialty Services Required    Requested Specialty:   Dermatology    Number of Visits Requested:   1    No results found for this or any previous visit (from the past 24 hour(s)). No results found.  ED Clinical Impression  Epithelial inclusion cyst  Secondary hypertension, unspecified   ED Assessment/Plan  Presentation most consistent with an inflamed epithelial inclusion cyst. We'll send home with doxycycline, have her continue her NSAIDs ibuprofen 800 mg twice a day, we'll provide  referral to surgery or dermatology to have this definitively removed.  Blood pressure also noted. Patient has no symptoms of a hypertensive emergency. also states that she has not taken all of her blood pressure medications today. Pt has no historical evidence of end organ damage. Pt denies any CNS type sx such as HA, visual changes, focal paresis, or new onset seizure activity. Pt denies any CV sx such as CP, dyspnea, palpitations, pedal edema, tearing pain radiating to back or abd. Pt denied any renal sx such as anuria or hematuria.   She does not know what her blood pressure has been running at home recently. Advised her to buy a blood pressure machine tonight,  and to keep a log of her blood pressure. she'll follow-up with her doctor about this. Giving hypertensive emergency precautions. Discussed labs, imaging, MDM, plan and followup with patient. Discussed sn/sx that should prompt return to the ED. Patient  agrees with plan.   *This clinic note was created using Dragon dictation software. Therefore, there may be occasional mistakes despite careful proofreading.  ?    Melynda Ripple, MD 10/26/15 1836

## 2015-10-27 ENCOUNTER — Encounter: Payer: Self-pay | Admitting: Cardiology

## 2015-10-28 ENCOUNTER — Encounter: Payer: Self-pay | Admitting: Cardiology

## 2015-10-28 NOTE — Telephone Encounter (Signed)
k

## 2015-10-28 NOTE — Telephone Encounter (Signed)
Review of chart shows that patient is now seeing Dr Vaughan Browner. Will sign off

## 2015-10-29 ENCOUNTER — Other Ambulatory Visit: Payer: Self-pay | Admitting: Internal Medicine

## 2015-10-29 DIAGNOSIS — N63 Unspecified lump in unspecified breast: Secondary | ICD-10-CM

## 2015-11-02 ENCOUNTER — Other Ambulatory Visit: Payer: Medicaid Other

## 2015-11-09 ENCOUNTER — Ambulatory Visit: Payer: Medicaid Other | Admitting: Pulmonary Disease

## 2015-11-11 ENCOUNTER — Ambulatory Visit: Payer: Medicaid Other | Admitting: Cardiology

## 2015-11-17 ENCOUNTER — Encounter: Payer: Self-pay | Admitting: Cardiology

## 2016-01-11 ENCOUNTER — Other Ambulatory Visit: Payer: Self-pay | Admitting: Internal Medicine

## 2016-01-11 DIAGNOSIS — E2839 Other primary ovarian failure: Secondary | ICD-10-CM

## 2016-01-21 ENCOUNTER — Inpatient Hospital Stay: Admission: RE | Admit: 2016-01-21 | Payer: Medicaid Other | Source: Ambulatory Visit

## 2016-02-08 ENCOUNTER — Emergency Department (HOSPITAL_COMMUNITY): Payer: Medicaid Other

## 2016-02-08 ENCOUNTER — Emergency Department (HOSPITAL_COMMUNITY)
Admission: EM | Admit: 2016-02-08 | Discharge: 2016-02-09 | Disposition: A | Discharge status: Home / Self Care | Payer: Medicaid Other | Attending: Emergency Medicine | Admitting: Emergency Medicine

## 2016-02-08 DIAGNOSIS — R079 Chest pain, unspecified: Secondary | ICD-10-CM

## 2016-02-08 DIAGNOSIS — E119 Type 2 diabetes mellitus without complications: Secondary | ICD-10-CM | POA: Insufficient documentation

## 2016-02-08 DIAGNOSIS — J449 Chronic obstructive pulmonary disease, unspecified: Secondary | ICD-10-CM | POA: Insufficient documentation

## 2016-02-08 DIAGNOSIS — R0789 Other chest pain: Secondary | ICD-10-CM

## 2016-02-08 DIAGNOSIS — I110 Hypertensive heart disease with heart failure: Secondary | ICD-10-CM | POA: Diagnosis not present

## 2016-02-08 DIAGNOSIS — Z79899 Other long term (current) drug therapy: Secondary | ICD-10-CM | POA: Insufficient documentation

## 2016-02-08 DIAGNOSIS — Z87891 Personal history of nicotine dependence: Secondary | ICD-10-CM | POA: Diagnosis not present

## 2016-02-08 DIAGNOSIS — I5032 Chronic diastolic (congestive) heart failure: Secondary | ICD-10-CM | POA: Diagnosis not present

## 2016-02-08 HISTORY — DX: Type 2 diabetes mellitus without complications: E11.9

## 2016-02-08 LAB — BASIC METABOLIC PANEL
ANION GAP: 14 (ref 5–15)
BUN: 7 mg/dL (ref 6–20)
CALCIUM: 9.2 mg/dL (ref 8.9–10.3)
CO2: 23 mmol/L (ref 22–32)
CREATININE: 0.73 mg/dL (ref 0.44–1.00)
Chloride: 105 mmol/L (ref 101–111)
Glucose, Bld: 90 mg/dL (ref 65–99)
Potassium: 3.6 mmol/L (ref 3.5–5.1)
SODIUM: 142 mmol/L (ref 135–145)

## 2016-02-08 LAB — CBC
HCT: 42.8 % (ref 36.0–46.0)
HEMOGLOBIN: 13.4 g/dL (ref 12.0–15.0)
MCH: 26.1 pg (ref 26.0–34.0)
MCHC: 31.3 g/dL (ref 30.0–36.0)
MCV: 83.4 fL (ref 78.0–100.0)
Platelets: 283 10*3/uL (ref 150–400)
RBC: 5.13 MIL/uL — AB (ref 3.87–5.11)
RDW: 15.2 % (ref 11.5–15.5)
WBC: 8.8 10*3/uL (ref 4.0–10.5)

## 2016-02-08 LAB — I-STAT TROPONIN, ED: TROPONIN I, POC: 0.02 ng/mL (ref 0.00–0.08)

## 2016-02-08 LAB — D-DIMER, QUANTITATIVE (NOT AT ARMC): D DIMER QUANT: 0.66 ug{FEU}/mL — AB (ref 0.00–0.50)

## 2016-02-08 NOTE — ED Triage Notes (Signed)
Pt from home via [facility]. C/o CP x1wk. Sts it gets worse w/ deep breaths. Sts she took 324 of ASA earlier w/ no relief. Pt sts she has a hx of MI, but no recollection as to when event occurred. Hx of htn & morbid obesity. Rates pain @ 5/10. A&O x4.

## 2016-02-08 NOTE — ED Notes (Signed)
Delay in lab draw pt in bathroom 

## 2016-02-08 NOTE — ED Notes (Signed)
ED Provider at bedside. 

## 2016-02-09 ENCOUNTER — Emergency Department (HOSPITAL_COMMUNITY): Payer: Medicaid Other

## 2016-02-09 ENCOUNTER — Encounter (HOSPITAL_COMMUNITY): Payer: Self-pay | Admitting: Radiology

## 2016-02-09 LAB — I-STAT TROPONIN, ED: Troponin i, poc: 0.01 ng/mL (ref 0.00–0.08)

## 2016-02-09 MED ORDER — IOPAMIDOL (ISOVUE-370) INJECTION 76%
100.0000 mL | Freq: Once | INTRAVENOUS | Status: AC | PRN
  Administered 2016-02-09: 100 mL via INTRAVENOUS

## 2016-02-09 NOTE — ED Notes (Signed)
ED Provider at bedside. 

## 2016-02-09 NOTE — ED Notes (Signed)
Patient transported to CT 

## 2016-02-09 NOTE — ED Notes (Signed)
Pt returned from CT °

## 2016-02-09 NOTE — ED Provider Notes (Signed)
Patient signed out pending repeat troponin and CT. These are both reassuring. CT was somewhat limited secondary to body habitus. On reexam, patient appears comfortable and is nontoxic. Discussed results with the patient. She has reproducible pain on exam. Some component of pain felt to be related to musculoskeletal or chest wall pain. Patient reassured. Follow-up with primary physician and cardiology. Discharge instructions per Dr. Oleta Mouse.  After history, exam, and medical workup I feel the patient has been appropriately medically screened and is safe for discharge home. Pertinent diagnoses were discussed with the patient. Patient was given return precautions.    Merryl Hacker, MD 02/09/16 [phone]

## 2016-02-09 NOTE — ED Provider Notes (Signed)
[facility] DEPT Provider Note   CSN: [id number] Arrival date & time: 02/08/16  2127     History   Chief Complaint Chief Complaint  Patient presents with  . Chest Pain    HPI Jenna Chandler is a 51 y.o. female.  HPI 51 year old female who presents with chest pain. She has a history of diabetes, morbid obesity, hypertension and diastolic heart failure. States onset of constant left-sided chest pain that radiates from the left side of her neck down the left arm and upper chest wall. States that she woke up one day with pain there and states that she normally sleeps on her left side and felt that this could be musculoskeletal pain. Today, states that when she took a deep breath and she had severe sharp pain underneath her left breast that radiated up to the shoulder. She has not had similar pain to this before which brought her to the ED for evaluation. Pain is not worsened by activity or exertion. Not associated with cough, shortness of breath, syncope or near syncope, fevers or chills. No lower extremity edema or calf tenderness. No recent immobilization, exogenous hormones, or prior history of PE/DVT.  Past Medical History:  Diagnosis Date  . Asthma   . CHF (congestive heart failure) ([place])   . COPD (chronic obstructive pulmonary disease) ([name])    +/- asthma   . Diabetes mellitus without complication ([place])   . Enlarged heart   . Fibromyalgia   . Hypertension   . Manic, depressive ([place])   . Morbid obesity ([place])   . Obesity   . OSA (obstructive sleep apnea)     Patient Active Problem List   Diagnosis Date Noted  . SOB (shortness of breath) 05/05/2014  . Chest pain 05/05/2014  . Chronic respiratory failure ([place]) 04/26/2012  . OSA (obstructive sleep apnea) 06/02/2011  . Lethargy 06/02/2011  . Diastolic CHF, chronic ([place]) 06/01/2011  . 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 HTN (hypertension) 05/11/2011  . Morbid obesity ([place]) 05/11/2011  . Normocytic  anemia 05/11/2011  . Manic, depressive ([place])     Past Surgical History:  Procedure Laterality Date  . NO PAST SURGERIES      OB History    Gravida Para Term Preterm AB Living   1 1 1     1    SAB TAB Ectopic Multiple Live Births           1       Home Medications    Prior to Admission medications   Medication Sig Start Date End Date Taking? Authorizing Provider  amLODipine (NORVASC) 10 MG tablet Take 10 mg by mouth 2 (two) times daily. 12/23/14  Yes Historical Provider, MD  cloNIDine (CATAPRES) 0.3 MG tablet Take 1 tablet (0.3 mg total) by mouth 3 (three) times daily. 01/16/13  Yes Sueanne Margarita, MD  cyclobenzaprine (FLEXERIL) 10 MG tablet Take 1 tablet (10 mg total) by mouth 2 (two) times daily as needed for muscle spasms. 08/26/13  Yes Tatyana Kirichenko, PA-C  furosemide (LASIX) 40 MG tablet Take 80 mg by mouth 2 (two) times daily. 05/18/11  Yes Lezlie Octave Black, NP  hydrochlorothiazide (HYDRODIURIL) 25 MG tablet Take 25 mg by mouth daily. 04/28/14  Yes Historical Provider, MD  ibuprofen (ADVIL,MOTRIN) 800 MG tablet Take 800 mg by mouth every 6 (six) hours as needed. AS NEEDED FOR MODERATE PAIN 12/22/14  Yes Historical Provider, MD  lisinopril (PRINIVIL,ZESTRIL) 20 MG tablet Take 1 tablet (20 mg  total) by mouth daily. 01/21/15  Yes Sueanne Margarita, MD  traZODone (DESYREL) 100 MG tablet Take 100 mg by mouth at bedtime as needed for sleep.   Yes Historical Provider, MD  ADVAIR DISKUS 250-50 MCG/DOSE AEPB inhale 1 dose by mouth twice a day Patient not taking: Reported on 02/08/2016 09/29/15   Praveen Mannam, MD  albuterol (PROVENTIL HFA;VENTOLIN HFA) 108 (90 BASE) MCG/ACT inhaler Inhale 2 puffs into the lungs every 6 (six) hours as needed. wheezing Patient not taking: Reported on 02/08/2016 12/29/13   Elsie Stain, MD  albuterol (PROVENTIL) (2.5 MG/3ML) 0.083% nebulizer solution Take 3 mLs (2.5 mg total) by nebulization every 6 (six) hours as needed. Patient not taking: Reported on  02/08/2016 11/17/14   Dorie Rank, MD  albuterol (PROVENTIL) (2.5 MG/3ML) 0.083% nebulizer solution inhale contents of 1 vial in nebulizer every 6 hours if needed Patient not taking: Reported on 02/08/2016 03/26/15   Marshell Garfinkel, MD  doxycycline (VIBRAMYCIN) 100 MG capsule Take 1 capsule (100 mg total) by mouth 2 (two) times daily. Patient not taking: Reported on 02/08/2016 10/26/15   Melynda Ripple, MD  HYDROcodone-acetaminophen [facility]) 5-325 MG tablet Take 1 tablet by mouth every 6 (six) hours as needed. Patient not taking: Reported on 02/08/2016 02/19/15   Marshell Garfinkel, MD    Family History Family History  Problem Relation Age of Onset  . Cancer Mother     Social History Social History  Substance Use Topics  . Smoking status: Former Smoker    Packs/day: 2.00    Years: 15.00    Types: Cigarettes    Quit date: 05/24/2002  . Smokeless tobacco: Never Used  . Alcohol use No     Allergies   Levaquin [levofloxacin]   Review of Systems Review of Systems 10/14 systems reviewed and are negative other than those stated in the HPI   Physical Exam Updated Vital Signs BP 132/91   Pulse 84   Temp 97.4 F (36.3 C) (Oral)   Resp 14   SpO2 97%   Physical Exam Physical Exam  Nursing note and vitals reviewed. Constitutional: Well developed, well nourished, non-toxic, and in no acute distress Head: Normocephalic and atraumatic.  Mouth/Throat: Oropharynx is clear and moist.  Neck: Normal range of motion. Neck supple.  Cardiovascular: Normal rate and regular rhythm.   Pulmonary/Chest: Effort normal and breath sounds normal. no chest wall tenderness Abdominal: Soft. There is no tenderness. There is no rebound and no guarding.  Musculoskeletal: Normal range of motion.  Neurological: Alert, no facial droop, fluent speech, moves all extremities symmetrically Skin: Skin is warm and dry.  Psychiatric: Cooperative   ED Treatments / Results  Labs (all labs ordered are listed, but only  abnormal results are displayed) Labs Reviewed  CBC - Abnormal; Notable for the following:       Result Value   RBC 5.13 (*)    All other components within normal limits  D-DIMER, QUANTITATIVE (NOT AT [facility]) - Abnormal; Notable for the following:    D-Dimer, Quant 0.66 (*)    All other components within normal limits  BASIC METABOLIC PANEL  Randolm Idol, ED  I-STAT TROPOININ, ED    EKG  EKG Interpretation  Date/Time:  Tuesday February 08 2016 22:25:41 EST Ventricular Rate:  81 PR Interval:    QRS Duration: 98 QT Interval:  401 QTC Calculation: 466 R Axis:   48 Text Interpretation:  Sinus rhythm Borderline T wave abnormalities similar to prior EKGs Confirmed by Shamonique Battiste MD, Shanon Seawright (  [id number]) on 02/08/2016 10:40:02 PM       Radiology Dg Chest 2 View  Result Date: 02/08/2016 CLINICAL DATA:  Chest pain for 1 week EXAM: CHEST  2 VIEW COMPARISON:  11/16/2014 FINDINGS: Mildly low lung volumes. No acute infiltrate or effusion. Stable borderline cardiomegaly without overt failure. Atherosclerosis of the aorta. No pneumothorax. IMPRESSION: Mildly low lung volumes. No radiographic evidence for acute cardiopulmonary abnormality. Electronically Signed   By: Donavan Foil M.D.   On: 02/08/2016 22:34    Procedures Procedures (including critical care time)  Medications Ordered in ED Medications  iopamidol (ISOVUE-370) 76 % injection 100 mL (100 mLs Intravenous Contrast Given 02/09/16 0112)     Initial Impression / Assessment and Plan / ED Course  I have reviewed the triage vital signs and the nursing notes.  Pertinent labs & imaging results that were available during my care of the patient were reviewed by me and considered in my medical decision making (see chart for details).  Clinical Course     Presenting with 2 weeks constant chest pain and pleuritic pain x 1 day. Mild tachycardia on arrival, that resolves w/o intervention. Low risk for PE, but ddimer positive and will go on to perform CTA  chest.  cxr visualized and negative for PTX, infiltrate, edema or other cardiopulmonary pocesses. EKG not ischemic and troponin x 1 negative. Pain atypical for and not concerning for dissection. Atypical symptoms for ACS as well, but will perform repeat given some risk factors. Heart score 3.  Pending CTA chest and repeat troponin. Anticipate discharge if negative. To be followed up by Dr. Dina Rich.  Final Clinical Impressions(s) / ED Diagnoses   Final diagnoses:  None    New Prescriptions New Prescriptions   No medications on file     Forde Dandy, MD 02/09/16 [phone]

## 2016-02-09 NOTE — Discharge Instructions (Signed)
Your work-up today is reassuring.  Return without fail for worsening symptoms, including escalating pain, difficulty breathing, passing out or any other symptoms concerning to you.

## 2016-02-10 ENCOUNTER — Other Ambulatory Visit: Payer: Self-pay | Admitting: Cardiology

## 2016-02-23 ENCOUNTER — Other Ambulatory Visit: Payer: Self-pay | Admitting: Cardiology

## 2016-04-28 ENCOUNTER — Other Ambulatory Visit: Payer: Self-pay | Admitting: Cardiology

## 2016-05-10 ENCOUNTER — Ambulatory Visit: Payer: Medicaid Other | Admitting: Cardiology

## 2016-05-23 ENCOUNTER — Telehealth: Payer: Self-pay | Admitting: Pulmonary Disease

## 2016-05-23 NOTE — Telephone Encounter (Signed)
Will need ov, overdue for f/u LMTCB

## 2016-05-24 NOTE — Telephone Encounter (Signed)
lmtcb x2 for pt. 

## 2016-05-25 NOTE — Telephone Encounter (Signed)
lmtcb X3 for pt.  Will close per triage protocol.  

## 2016-06-16 ENCOUNTER — Ambulatory Visit (INDEPENDENT_AMBULATORY_CARE_PROVIDER_SITE_OTHER): Payer: Medicaid Other | Admitting: Cardiology

## 2016-06-16 ENCOUNTER — Encounter: Payer: Self-pay | Admitting: Cardiology

## 2016-06-16 VITALS — BP 144/110 | HR 74 | Resp 16 | Ht 64.0 in | Wt >= 6400 oz

## 2016-06-16 DIAGNOSIS — I10 Essential (primary) hypertension: Secondary | ICD-10-CM | POA: Diagnosis not present

## 2016-06-16 DIAGNOSIS — I5032 Chronic diastolic (congestive) heart failure: Secondary | ICD-10-CM | POA: Diagnosis not present

## 2016-06-16 DIAGNOSIS — G4733 Obstructive sleep apnea (adult) (pediatric): Secondary | ICD-10-CM

## 2016-06-16 MED ORDER — CARVEDILOL 6.25 MG PO TABS: 6.2500 mg | ORAL_TABLET | Freq: Two times a day (BID) | ORAL | 11 refills | Status: DC

## 2016-06-16 MED ORDER — SPIRONOLACTONE 25 MG PO TABS: 25.0000 mg | ORAL_TABLET | Freq: Every day | ORAL | 11 refills | Status: DC

## 2016-06-16 NOTE — Progress Notes (Signed)
Cardiology Office Note    Date:  06/16/2016   ID:  Jenna Chandler, DOB 1965/07/03, MRN [id number]  PCP:  Nolene Ebbs, MD  Cardiologist:  Fransico Him, MD   Chief Complaint  Patient presents with  . Congestive Heart Failure  . Hypertension  . Sleep Apnea    History of Present Illness:  Jenna Chandler is a 51 y.o. female with a history of chronic diastolic CHF, HTN, morbid obesity and OSA on CPAP who presents for followup. Her EF was normal on echo 2013.  She is doing well. She denies any chest pain or pressure.  She denies any dizziness or syncope.  She continues to have DOE with walking which she thinks is worse but has morbid obesity.  She uses O2 PRN and is followed by Pulmonary.  She says that recently she has had more SOB when sleeping and has to position her pillows in order to breath ok when she is sleeping. She is now feeling tired in the am and has to take naps occasionally during the day.  She feels that she needs more CPAP pressure but does tolerate the full face mask without any problems. She still has some problems with dry mouth. Her CPAP machine has some electrical problems and needs a new machine. She contacted [facility] and they said she needed a new machine.  She says that for the past month she has been having palpitations  and was seen in the ER several times but workup was normal.    Past Medical History:  Diagnosis Date  . Asthma   . Chronic diastolic CHF (congestive heart failure) ([place])   . COPD (chronic obstructive pulmonary disease) ([name])    +/- asthma   . Diabetes mellitus without complication ([place])   . Enlarged heart   . Fibromyalgia   . Hypertension   . Manic, depressive ([place])   . Morbid obesity ([place])   . OSA (obstructive sleep apnea)     Past Surgical History:  Procedure Laterality Date  . NO PAST SURGERIES      Current Medications: Current Meds  Medication Sig  . ADVAIR DISKUS 250-50 MCG/DOSE AEPB inhale 1 dose by mouth twice a day  .  albuterol (PROVENTIL HFA;VENTOLIN HFA) 108 (90 BASE) MCG/ACT inhaler Inhale 2 puffs into the lungs every 6 (six) hours as needed. wheezing  . albuterol (PROVENTIL) (2.5 MG/3ML) 0.083% nebulizer solution Take 3 mLs (2.5 mg total) by nebulization every 6 (six) hours as needed.  Marland Kitchen amLODipine (NORVASC) 10 MG tablet Take 10 mg by mouth 2 (two) times daily.  . cloNIDine (CATAPRES) 0.3 MG tablet Take 1 tablet (0.3 mg total) by mouth 3 (three) times daily.  . cyclobenzaprine (FLEXERIL) 10 MG tablet Take 1 tablet (10 mg total) by mouth 2 (two) times daily as needed for muscle spasms.  . furosemide (LASIX) 40 MG tablet Take 80 mg by mouth 2 (two) times daily.  . hydrochlorothiazide (HYDRODIURIL) 25 MG tablet Take 25 mg by mouth daily.  Marland Kitchen ibuprofen (ADVIL,MOTRIN) 800 MG tablet Take 800 mg by mouth every 6 (six) hours as needed. AS NEEDED FOR MODERATE PAIN  . traZODone (DESYREL) 100 MG tablet Take 100 mg by mouth at bedtime as needed for sleep.    Allergies:   Lisinopril and Levaquin [levofloxacin]   Social History   Social History  . Marital status: Single    Spouse name: N/A  . Number of children: N/A  . Years of education: N/A   Social  History Main Topics  . Smoking status: Former Smoker    Packs/day: 2.00    Years: 15.00    Types: Cigarettes    Quit date: 05/24/2002  . Smokeless tobacco: Never Used  . Alcohol use No  . Drug use: No  . Sexual activity: Not Currently    Birth control/ protection: Abstinence   Other Topics Concern  . None   Social History Narrative  . None     Family History:  The patient's family history includes Cancer in her mother.   ROS:   Please see the history of present illness.    ROS All other systems reviewed and are negative.  No flowsheet data found.     PHYSICAL EXAM:   VS:  BP (!) 144/110   Pulse 74   Resp 16   Ht 5\' 4"  (1.626 m)   Wt (!) 434 lb (196.9 kg)   SpO2 97%   BMI 74.50 kg/m    GEN: Well nourished, well developed, in no acute  distress  HEENT: normal  Neck: no JVD, carotid bruits, or masses Cardiac: RRR; no murmurs, rubs, or gallops.  Trace edema.  Intact distal pulses bilaterally.  Respiratory:  clear to auscultation bilaterally, normal work of breathing GI: soft, nontender, nondistended, + BS MS: no deformity or atrophy  Skin: warm and dry, no rash Neuro:  Alert and Oriented x 3, Strength and sensation are intact Psych: euthymic mood, full affect  Wt Readings from Last 3 Encounters:  06/16/16 (!) 434 lb (196.9 kg)  02/09/16 (!) 444 lb (201.4 kg)  02/19/15 (!) 444 lb 9.6 oz (201.7 kg)      Studies/Labs Reviewed:   EKG:  EKG is not ordered today.    Recent Labs: 02/08/2016: BUN 7; Creatinine, Ser 0.73; Hemoglobin 13.4; Platelets 283; Potassium 3.6; Sodium 142   Lipid Panel    Component Value Date/Time   CHOL 152 03/04/2008 2146   TRIG 103 03/04/2008 2146   HDL 56 03/04/2008 2146   CHOLHDL 2.7 Ratio 03/04/2008 2146   VLDL 21 03/04/2008 2146   [place] 75 03/04/2008 2146    Additional studies/ records that were reviewed today include:  none    ASSESSMENT:    1. Diastolic CHF, chronic ([place])   2. Essential hypertension   3. OSA (obstructive sleep apnea)   4. Morbid obesity ([place])      PLAN:  In order of problems listed above:  1.  Chronic diastolic CHF - she appears euvolemic on exam today.  Her weight has actually decreased 10lbs in the past year.  She continues to have DOE and now what sounds like orthopnea but difficult to say how much is CHF and how much is due to morbid obesity with obesity hypoventilation syndrome. I will check a BMET and BNP today. She will continue on current dose of lasix for now. I will get an echo to reevaluate LVF since her last echo was in 2013.  2. HTN - BP is poorly controlled on exam today. She will continue on amlodipine, Clonidine.  I will stop HCTZ and start aldactone 25mg  daily and check BMET in 1 week.  I will also start Coreg 6.25mg  BID for better BP  control.  She will followup with HTN clinic in 1 week for up titration of Bp meds.  She was on Lisinopril but had an cough on this.  May consider addition of ARB if needed.  3.  OSA - the patient is tolerating PAP therapy but feels  that she needs more pressure.  Her machine also has an electrical problem and she needs a new machine.  I am going to order a new split night sleep study to re titrate given her morbid obesity.   4.  Morbid obesity - I have encouraged her to get into a routine exercise program and cut back on carbs and portions.      Medication Adjustments/Labs and Tests Ordered: Current medicines are reviewed at length with the patient today.  Concerns regarding medicines are outlined above.  Medication changes, Labs and Tests ordered today are listed in the Patient Instructions below.  There are no Patient Instructions on file for this visit.   Signed, Fransico Him, MD  06/16/2016 3:31 PM    [facility] [address], [place], [place]  [address] Phone: [phone]; Fax: [phone]

## 2016-06-16 NOTE — Patient Instructions (Signed)
Medication Instructions:  1) STOP HCTZ 2) START ALDACTONE 25 mg daily 3) START COREG 6.25 mg TWICE DAILY  Labwork: TODAY: BMET, BNP  Testing/Procedures: Your physician has requested that you have an echocardiogram. Echocardiography is a painless test that uses sound waves to create images of your heart. It provides your doctor with information about the size and shape of your heart and how well your heart's chambers and valves are working. This procedure takes approximately one hour. There are no restrictions for this procedure.   Your physician has recommended that you have a sleep study. This test records several body functions during sleep, including: brain activity, eye movement, oxygen and carbon dioxide blood levels, heart rate and rhythm, breathing rate and rhythm, the flow of air through your mouth and nose, snoring, body muscle movements, and chest and belly movement.  Follow-Up: Your physician recommends that you schedule a follow-up appointment in 1-2 weeks in the HTN CLINIC.  Your physician wants you to follow-up in: 6 months with Dr. Theodosia Blender assistant. You will receive a reminder letter in the mail two months in advance. If you don't receive a letter, please call our office to schedule the follow-up appointment.   Your physician wants you to follow-up in: 1 year with Dr. Radford Pax. You will receive a reminder letter in the mail two months in advance. If you don't receive a letter, please call our office to schedule the follow-up appointment.   Any Other Special Instructions Will Be Listed Below (If Applicable).     If you need a refill on your cardiac medications before your next appointment, please call your pharmacy.

## 2016-06-17 LAB — BASIC METABOLIC PANEL
BUN / CREAT RATIO: 12 (ref 9–23)
BUN: 10 mg/dL (ref 6–24)
CHLORIDE: 96 mmol/L (ref 96–106)
CO2: 29 mmol/L (ref 18–29)
CREATININE: 0.81 mg/dL (ref 0.57–1.00)
Calcium: 9.3 mg/dL (ref 8.7–10.2)
GFR calc non Af Amer: 84 mL/min/{1.73_m2} (ref >59)
GFR, EST AFRICAN AMERICAN: 97 mL/min/{1.73_m2} (ref >59)
Glucose: 92 mg/dL (ref 65–99)
Potassium: 4.1 mmol/L (ref 3.5–5.2)
SODIUM: 143 mmol/L (ref 134–144)

## 2016-06-17 LAB — PRO B NATRIURETIC PEPTIDE: NT-PRO BNP: 75 pg/mL (ref 0–249)

## 2016-06-19 ENCOUNTER — Telehealth: Payer: Self-pay | Admitting: *Deleted

## 2016-06-19 ENCOUNTER — Telehealth: Payer: Self-pay | Admitting: Cardiology

## 2016-06-19 NOTE — Telephone Encounter (Signed)
Informed patient of results and verbal understanding expressed.  

## 2016-06-19 NOTE — Telephone Encounter (Signed)
F/U  Patient is returning a call from [facility] regarding her test results

## 2016-06-19 NOTE — Telephone Encounter (Signed)
-----   Message from Sueanne Margarita, MD sent at 06/18/2016  5:22 PM EDT ----- Please let patient know that labs were normal.  Continue current medical therapy.

## 2016-06-19 NOTE — Telephone Encounter (Signed)
ESS=15 Informed patient of upcoming home sleep study and patient understanding was verbalized. Patient understands her sleep study is scheduled for Friday August 18 2016. Patient understands her sleep study will be done at [facility] sleep lab. Patient understands she will receive a sleep packet in a week or so. Patient understands to call if she does not receive the sleep packet in a timely manner. Patient agrees with treatment and thanked me for call.

## 2016-06-27 ENCOUNTER — Ambulatory Visit (INDEPENDENT_AMBULATORY_CARE_PROVIDER_SITE_OTHER): Payer: Medicaid Other | Admitting: Pulmonary Disease

## 2016-06-27 ENCOUNTER — Encounter: Payer: Self-pay | Admitting: Pulmonary Disease

## 2016-06-27 VITALS — BP 134/76 | HR 100 | Ht 64.0 in | Wt >= 6400 oz

## 2016-06-27 DIAGNOSIS — J42 Unspecified chronic bronchitis: Secondary | ICD-10-CM

## 2016-06-27 DIAGNOSIS — R0602 Shortness of breath: Secondary | ICD-10-CM

## 2016-06-27 MED ORDER — AZITHROMYCIN 250 MG PO TABS: ORAL_TABLET | ORAL | 0 refills | Status: AC

## 2016-06-27 NOTE — Progress Notes (Signed)
Jenna Chandler    [id number]    Nov 01, 1965  Primary Care Physician:Avbuere, Christean Grief, MD  Referring Physician: Nolene Ebbs, MD [address] [place], [place] [address]  Chief complaint:   COPD with small airways disease. Chronic bronchitis. Morbid obesity OSA on CPAP  HPI: [name] is here for follow-up of her COPD,chronic bronchitis. She is a former patient of Dr. Joya Gaskins. She associates with morbid obesity, COPD, bronchitis. She has recurrent attacks of bronchitis and needs to go on antibiotics frequently. She was last given multiple courses of antibiotic over Fall season including doxycycline, Z-Pak.  Today she states that her dyspnea is worsened over last few weeks with increasing cough, dark sputum production. She has some chills but no objective fevers. She has OSA and is on CPAP. She uses this every day. She is on O2 at night to the CPAP and also during the daytime on exertion.  She smoked for about 30-pack-year. She quit in 2004.  Interim history: She has complains of cough with green sputum production occasional blood-tinged sputum. Denies any fevers, chills, dyspnea. She is trying to lose weight. She was evaluated in the emergency room for pleuritic chest pain. Noted to have elevated d-dimer however CT of the chest did not show any pulmonary embolism or other lung abnormalities.  Outpatient Encounter Prescriptions as of 06/27/2016  Medication Sig  . albuterol (PROVENTIL HFA;VENTOLIN HFA) 108 (90 BASE) MCG/ACT inhaler Inhale 2 puffs into the lungs every 6 (six) hours as needed. wheezing  . albuterol (PROVENTIL) (2.5 MG/3ML) 0.083% nebulizer solution Take 3 mLs (2.5 mg total) by nebulization every 6 (six) hours as needed.  Marland Kitchen amLODipine (NORVASC) 10 MG tablet Take 10 mg by mouth 2 (two) times daily.  . carvedilol (COREG) 6.25 MG tablet Take 1 tablet (6.25 mg total) by mouth 2 (two) times daily.  . cloNIDine (CATAPRES) 0.3 MG tablet Take 1 tablet (0.3 mg total)  by mouth 3 (three) times daily.  . cyclobenzaprine (FLEXERIL) 10 MG tablet Take 1 tablet (10 mg total) by mouth 2 (two) times daily as needed for muscle spasms.  . furosemide (LASIX) 40 MG tablet Take 80 mg by mouth 2 (two) times daily.  Marland Kitchen ibuprofen (ADVIL,MOTRIN) 800 MG tablet Take 800 mg by mouth every 6 (six) hours as needed. AS NEEDED FOR MODERATE PAIN  . spironolactone (ALDACTONE) 25 MG tablet Take 1 tablet (25 mg total) by mouth daily.  . traZODone (DESYREL) 100 MG tablet Take 100 mg by mouth at bedtime as needed for sleep.  . [DISCONTINUED] ADVAIR DISKUS 250-50 MCG/DOSE AEPB inhale 1 dose by mouth twice a day   No facility-administered encounter medications on file as of 06/27/2016.     Allergies as of 06/27/2016 - Review Complete 06/27/2016  Allergen Reaction Noted  . Lisinopril Cough 06/16/2016  . Levaquin [levofloxacin]      Past Medical History:  Diagnosis Date  . Asthma   . Chronic diastolic CHF (congestive heart failure) ([facility])   . COPD (chronic obstructive pulmonary disease) ([name])    +/- asthma   . Diabetes mellitus without complication ([facility])   . Enlarged heart   . Fibromyalgia   . Hypertension   . Manic, depressive ([facility])   . Morbid obesity ([facility])   . OSA (obstructive sleep apnea)     Past Surgical History:  Procedure Laterality Date  . NO PAST SURGERIES      Family History  Problem Relation Age of Onset  . Cancer  Mother     Social History   Social History  . Marital status: Single    Spouse name: N/A  . Number of children: N/A  . Years of education: N/A   Occupational History  . Not on file.   Social History Main Topics  . Smoking status: Former Smoker    Packs/day: 2.00    Years: 15.00    Types: Cigarettes    Quit date: 05/24/2002  . Smokeless tobacco: Never Used  . Alcohol use No  . Drug use: No  . Sexual activity: Not Currently    Birth control/ protection: Abstinence   Other Topics Concern  . Not on file   Social History Narrative  .  No narrative on file    Review of systems: Review of Systems  Constitutional: Negative for fever and chills.  HENT: Negative.   Eyes: Negative for blurred vision.  Respiratory: as per HPI  Cardiovascular: Negative for chest pain and palpitations.  Gastrointestinal: Negative for vomiting, diarrhea, blood per rectum. Genitourinary: Negative for dysuria, urgency, frequency and hematuria.  Musculoskeletal: Negative for myalgias, back pain and joint pain.  Skin: Negative for itching and rash.  Neurological: Negative for dizziness, tremors, focal weakness, seizures and loss of consciousness.  Endo/Heme/Allergies: Negative for environmental allergies.  Psychiatric/Behavioral: Negative for depression, suicidal ideas and hallucinations.  All other systems reviewed and are negative.  Physical Exam: Blood pressure 134/76, pulse 100, height 5\' 4"  (1.626 m), weight (!) 432 lb (196 kg), SpO2 97 %. Gen:      No acute distress HEENT:  EOMI, sclera anicteric Neck:     No masses; no thyromegaly Lungs:    Clear to auscultation bilaterally; normal respiratory effort CV:         Regular rate and rhythm; no murmurs Abd:      + bowel sounds; soft, non-tender; no palpable masses, no distension Ext:    No edema; adequate peripheral perfusion Skin:      Warm and dry; no rash Neuro: alert and oriented x 3 Psych: normal mood and affect  Data Reviewed: PFTs 08/14/11  FVC 1.42 (42%) FEV1 1.06 (41%) F/F 74 FEF 25-75 percent 0.86 [29%] TLC 57% DLCO 36% Minimal obstruction, small airway disease as shown by reduction in mid flow rates. There is a significant response admitted for [facility] bronchial dilator response. Moderate restriction and severe DLCO production. DLCO corrects for alveolar volume.  CXR 11/16/14 No acute cardiopulmonary abnormality. CT angiogram 02/09/16 No pulmonary embolism, no lung abnormalities identified I have reviewed all images personally.  Assessment:  COPD Chronic  bronchitis [name] continues to have issues with COPD, chronic bronchitis, recurrent infections. I suspect that her obesity is a major contributor to her symptoms.  She'll continue on the Advair and albuterol. We will order a more recent set of PFTs to evaluate her lung function as last assessment was in 2013. I'll give her a prescription for Z-Pak and have instructed her to call if her cough with sputum and minor hemoptysis continues.  She was walked in the office today and did not desat. Hence we will hold the ambulatory O2.   OSA She'll continue the nocturnal oxygen and CPAP. She follows with Dr. Radford Pax and split-night study has been ordered for re-titration  Morbid obesity She is working on weight loss with an exercise program and a diet program. I have congratulated her and encouraged her to keep working on weight loss   Plan/Recommendations: - Z pack - PFTs - Continue current inhalers -  Continue working on weight loss  Marshell Garfinkel MD [facility] Pager [phone] 06/27/2016, 3:51 PM  CC: Nolene Ebbs, MD

## 2016-06-27 NOTE — Patient Instructions (Addendum)
We will give prescription for Z-Pak. Please call back if you have persistent symptoms of cough and blood in the sputum. Continue CPAP with oxygen at night. You don't need oxygen during the daytime Continue current inhalers Continue weight loss We'll schedule for pulmonary function tests Return to clinic in 6 months.

## 2016-06-29 ENCOUNTER — Encounter (HOSPITAL_COMMUNITY): Payer: Self-pay | Admitting: *Deleted

## 2016-06-29 ENCOUNTER — Other Ambulatory Visit: Payer: Self-pay

## 2016-06-29 ENCOUNTER — Ambulatory Visit (HOSPITAL_COMMUNITY): Payer: Medicaid Other | Attending: Cardiovascular Disease

## 2016-06-29 DIAGNOSIS — I5032 Chronic diastolic (congestive) heart failure: Secondary | ICD-10-CM | POA: Insufficient documentation

## 2016-06-29 NOTE — Progress Notes (Unsigned)
Suggest Definity to patient and she did not want it.

## 2016-07-04 ENCOUNTER — Telehealth: Payer: Self-pay | Admitting: Cardiology

## 2016-07-04 NOTE — Telephone Encounter (Signed)
Patient called to ask if she still needed to come for [facility] appointment. Reiterated to patient that several medication changes were made at her last OV, and her BP needs to be checked to see if any changes need to be made.  See Dr. Theodosia Blender note below: "2. HTN - BP is poorly controlled on exam today. She will continue on amlodipine, Clonidine.  I will stop HCTZ and start aldactone 25mg  daily and check BMET in 1 week.  I will also start Coreg 6.25mg  BID for better BP control.  She will followup with HTN clinic in 1 week for up titration of Bp meds.  She was on Lisinopril but had an cough on this.  May consider addition of ARB if needed."  She reports she made the medication changes for only 2 days before "going to the old routine."  She reports she read lots of information about Coreg and it scares her to take. She states the only 2 nights she took the Coreg and the Aldactone, her body starting shaking, she had a lot of palpitations, and she thought her heart was going to beat out of her chest. She almost went to the ED but didn't. She resumed her HCTZ and Lisinopril 20 mg qod instead of daily after the episode. She now reports no symptoms, not even the cough that made her stop her Lisinopril. BP while on the phone was 136/83  Patient understands she will be called if Dr. Radford Pax has further instructions.   Med list updated.

## 2016-07-04 NOTE — Telephone Encounter (Signed)
Follow Up Call   Jenna Chandler is returning your call . Thanks

## 2016-07-04 NOTE — Telephone Encounter (Signed)
Informed patient of ECHO results and verbal understanding expressed.   The patient understands she will be called if Dr. Radford Pax has further medication recommendations (see previous note below).

## 2016-07-04 NOTE — Telephone Encounter (Signed)
Patient called to cancel appt with Pharmacist for 07-05-16 and would like to know if she needs to r/s an appt.. Patient would like to know if it is a necessity. Thanks.

## 2016-07-04 NOTE — Telephone Encounter (Signed)
-----   Message from Sueanne Margarita, MD sent at 06/30/2016  2:05 PM EDT ----- Echo showed normal LVF with mildly dilated RA

## 2016-07-04 NOTE — Telephone Encounter (Signed)
Change Lisinopril to Losartan 50mg  daily due to cough and followup in HTN clinic in 1 week.  Please have her check her BP daily for a week and call with results

## 2016-07-05 ENCOUNTER — Ambulatory Visit: Payer: Medicaid Other

## 2016-07-05 MED ORDER — LOSARTAN POTASSIUM 50 MG PO TABS: 50.0000 mg | ORAL_TABLET | Freq: Every day | ORAL | 3 refills | Status: DC

## 2016-07-05 NOTE — Telephone Encounter (Signed)
Instructed patient to STOP LISINOPRIL and START LOSARTAN 50 mg daily. She declines to schedule [facility] Clinic OV as copays are too expensive for her right now. Patient will check BP daily for a week after she starts and call with results. She was grateful for call.

## 2016-07-24 ENCOUNTER — Telehealth: Payer: Self-pay | Admitting: Cardiology

## 2016-07-24 NOTE — Telephone Encounter (Signed)
New message   Pt verbalized that she is calling to speak to rn because she needs to go over her medications

## 2016-07-24 NOTE — Telephone Encounter (Signed)
Reviewed medication list with patient. She states she is now NOT taking HCTZ (she reported she was taking the medication 5/29 - see phone note). She is concerned she is taking too much diuretic and that she may be "putting off too much water." Patient is confused about what she is taking versus what she should be taking. Reiterated to patient that she changed her medications a couple weeks ago on her own because she felt the meds made her feel poorly. She remembers this but it sounds like she did not change her medications correctly.  Patient states she is NOT taking HCTZ, Lisinopril, Aldactone or Coreg. She IS taking Losartan 50 mg daily, Clonidine 0.3 TID, Lasix 80 mg BID, almodipine 10 mg BID. She feels fine, but would like to be assessed to possible discontinue some of the medications.  Scheduled patient with Melina Copa this Wednesday. Patient understands to bring all her medications with her to the appointment.  Med list updated as instructed by patient.

## 2016-07-26 ENCOUNTER — Ambulatory Visit: Payer: Medicaid Other | Admitting: Physician Assistant

## 2016-08-02 ENCOUNTER — Ambulatory Visit: Payer: Medicaid Other

## 2016-08-07 ENCOUNTER — Telehealth: Payer: Self-pay | Admitting: Cardiology

## 2016-08-07 NOTE — Telephone Encounter (Signed)
Patient calling states that Dr. Radford Pax put her on losarten and her PCP put her on valsartan. Patient believes she is taking too much medication and " doesn't know what she is doing."

## 2016-08-07 NOTE — Telephone Encounter (Signed)
Patient called to report that she is still confused about her medications. Her PCP started her on Valsartan 160 mg daily and when she went to pick it up her pharmacist told her she shouldn't take Losartan and Valsartan. She thinks her PCP told her not to take the Losartan but she isn't sure. From now on, she will take Valsartan and NOT Losartan.  Scheduled patient in [facility] Clinic 7/11. Reiterated to patient the importance of keeping this appointment. Instructed her to bring all of her medications to the appointment.  Med list updated.

## 2016-08-07 NOTE — Telephone Encounter (Signed)
Left message to call back  

## 2016-08-16 ENCOUNTER — Ambulatory Visit: Payer: Medicaid Other

## 2016-08-18 ENCOUNTER — Ambulatory Visit (HOSPITAL_BASED_OUTPATIENT_CLINIC_OR_DEPARTMENT_OTHER): Payer: Medicaid Other | Attending: Cardiology | Admitting: Cardiology

## 2016-08-18 DIAGNOSIS — G4719 Other hypersomnia: Secondary | ICD-10-CM | POA: Diagnosis not present

## 2016-08-18 DIAGNOSIS — G4733 Obstructive sleep apnea (adult) (pediatric): Secondary | ICD-10-CM | POA: Insufficient documentation

## 2016-08-18 DIAGNOSIS — I5032 Chronic diastolic (congestive) heart failure: Secondary | ICD-10-CM | POA: Diagnosis not present

## 2016-08-28 NOTE — Procedures (Signed)
   Patient Name: Jenna Chandler, Jenna Chandler Date: 08/18/2016 Gender: Female D.O.B: 03-Dec-1965 Age (years): 10 Referring Provider: Fransico Him MD, ABSM Height (inches): 64 Interpreting Physician: Fransico Him MD, ABSM Weight (lbs): 431 RPSGT: Jorge Ny BMI: 74 MRN: [id number] Neck Size: 18.00  CLINICAL INFORMATION Sleep Study Type: NPSG Indication for sleep study: Congestive Heart Failure, COPD, Hypertension, Obesity, OSA Epworth Sleepiness Score: 12  SLEEP STUDY TECHNIQUE As per the AASM Manual for the Scoring of Sleep and Associated Events v2.3 (April 2016) with a hypopnea requiring 4% desaturations.  The channels recorded and monitored were frontal, central and occipital EEG, electrooculogram (EOG), submentalis EMG (chin), nasal and oral airflow, thoracic and abdominal wall motion, anterior tibialis EMG, snore microphone, electrocardiogram, and pulse oximetry.  MEDICATIONS Medications self-administered by patient taken the night of the study : N/A  SLEEP ARCHITECTURE The study was initiated at 10:18:22 PM and ended at 5:14:51 AM.  Sleep onset time was 8.3 minutes and the sleep efficiency was 24.6%. The total sleep time was 102.5 minutes.  Stage REM latency was N/A minutes.  The patient spent 29.27% of the night in stage N1 sleep, 70.73% in stage N2 sleep, 0.00% in stage N3 and 0.00% in REM.  Alpha intrusion was absent.  Supine sleep was 83.90%.  RESPIRATORY PARAMETERS The overall apnea/hypopnea index (AHI) was 0.0 per hour. There were 0 total apneas, including 0 obstructive, 0 central and 0 mixed apneas. There were 0 hypopneas and 11 RERAs.  The AHI during Stage REM sleep was N/A per hour.  AHI while supine was 0.0 per hour.  The mean oxygen saturation was 92.23%. The minimum SpO2 during sleep was 87.00%.  Moderate snoring was noted during this study.  CARDIAC DATA The 2 lead EKG demonstrated sinus rhythm. The mean heart rate was 80.84 beats per minute.  Other EKG findings include: PVCs.  LEG MOVEMENT DATA The total PLMS were 0 with a resulting PLMS index of 0.00. Associated arousal with leg movement index was 0.0 .  IMPRESSIONS - No significant obstructive sleep apnea occurred during this study (AHI = 0.0/h). - No significant central sleep apnea occurred during this study (CAI = 0.0/h). - Moderate oxygen desaturation was noted during this study (Min O2 = 87.00%). - The patient snored with Moderate snoring volume. - EKG findings include PVCs. - Clinically significant periodic limb movements did not occur during sleep. No significant associated arousals.  DIAGNOSIS - Normal study but lack of adequate sleep  RECOMMENDATIONS - Recommend repeat sleep study with sleep aide. - Avoid alcohol, sedatives and other CNS depressants that may worsen sleep apnea and disrupt normal sleep architecture. - Sleep hygiene should be reviewed to assess factors that may improve sleep quality. - Weight management and regular exercise should be initiated or continued if appropriate.   [place], American Board of Sleep Medicine  ELECTRONICALLY SIGNED ON:  08/28/2016, 8:58 PM [place] PH: [phone]   FX: [phone] [place]

## 2016-08-30 ENCOUNTER — Telehealth: Payer: Self-pay | Admitting: *Deleted

## 2016-08-30 DIAGNOSIS — G4733 Obstructive sleep apnea (adult) (pediatric): Secondary | ICD-10-CM

## 2016-08-30 MED ORDER — ESZOPICLONE 2 MG PO TABS: 2.0000 mg | ORAL_TABLET | Freq: Every evening | ORAL | 0 refills | Status: DC | PRN

## 2016-08-30 NOTE — Telephone Encounter (Signed)
-----   Message from Sueanne Margarita, MD sent at 08/28/2016  9:02 PM EDT ----- Please let patient know that sleep study did not show significant OSA but she had very little sleep time.  Please repeat in lab sleep study with a sleep Aide . Lunesta 2mg  #1 tablet with no refills to be taken once at sleep lab.

## 2016-08-30 NOTE — Telephone Encounter (Addendum)
Informed patient of sleep study results and patient understanding was verbalized. Patient agrees that she had very little sleep time.  Patient understands her sleep study is scheduled for Friday November 03 2016. Patient understands her sleep study will be done at [facility] sleep lab. Patient understands she will receive a sleep packet in a week or so. Patient understands to call if she does not receive the sleep packet in a timely manner. Patient states she can not sleep without her CPAP machine. Patient states she slept part of the night with the cpap on during her test and the last part of the night without the cpap. Patient states she is glad she was prescribed Lunesta 2 mg to help her sleep. Lunesta 2 mg ordered at [place], [address]. Confirmation of prescription received by Baxter Flattery (pharmacist). Patient agrees with treatment and thanked me for call

## 2016-08-31 ENCOUNTER — Telehealth: Payer: Self-pay | Admitting: *Deleted

## 2016-08-31 NOTE — Addendum Note (Signed)
Addended by: Freada Bergeron on: 08/31/2016 08:42 AM   Modules accepted: Orders

## 2016-08-31 NOTE — Telephone Encounter (Signed)
Patient called to say the cpap she has now is barely blowing any air out of it and it is very hard to sleep and breathe with it like that.  Patient was advised to call her DME to receive help with her device. Patient informed me [facility] said they needed to hear from the heart care office before they will help her. Reached out to [facility] Apolonio Schneiders) and asked her to call the patient to troubleshoot her cpap machine. Apolonio Schneiders said she would call the patient today.

## 2016-08-31 NOTE — Telephone Encounter (Signed)
Confirmation of appointment for DME to trouble shoot patient's cpap.

## 2016-10-10 ENCOUNTER — Encounter (HOSPITAL_COMMUNITY): Payer: Self-pay | Admitting: Emergency Medicine

## 2016-10-10 ENCOUNTER — Emergency Department (HOSPITAL_COMMUNITY)
Admission: EM | Admit: 2016-10-10 | Discharge: 2016-10-11 | Disposition: A | Discharge status: Home / Self Care | Payer: Medicaid Other | Attending: Emergency Medicine | Admitting: Emergency Medicine

## 2016-10-10 ENCOUNTER — Emergency Department (HOSPITAL_COMMUNITY): Payer: Medicaid Other

## 2016-10-10 DIAGNOSIS — I5032 Chronic diastolic (congestive) heart failure: Secondary | ICD-10-CM | POA: Diagnosis not present

## 2016-10-10 DIAGNOSIS — J45909 Unspecified asthma, uncomplicated: Secondary | ICD-10-CM | POA: Insufficient documentation

## 2016-10-10 DIAGNOSIS — I110 Hypertensive heart disease with heart failure: Secondary | ICD-10-CM | POA: Insufficient documentation

## 2016-10-10 DIAGNOSIS — R079 Chest pain, unspecified: Secondary | ICD-10-CM | POA: Diagnosis not present

## 2016-10-10 DIAGNOSIS — J449 Chronic obstructive pulmonary disease, unspecified: Secondary | ICD-10-CM | POA: Diagnosis not present

## 2016-10-10 DIAGNOSIS — R600 Localized edema: Secondary | ICD-10-CM

## 2016-10-10 DIAGNOSIS — Z87891 Personal history of nicotine dependence: Secondary | ICD-10-CM | POA: Diagnosis not present

## 2016-10-10 DIAGNOSIS — Z79899 Other long term (current) drug therapy: Secondary | ICD-10-CM | POA: Insufficient documentation

## 2016-10-10 DIAGNOSIS — L03115 Cellulitis of right lower limb: Secondary | ICD-10-CM | POA: Insufficient documentation

## 2016-10-10 DIAGNOSIS — R2241 Localized swelling, mass and lump, right lower limb: Secondary | ICD-10-CM | POA: Diagnosis present

## 2016-10-10 LAB — CBC
HCT: 41 % (ref 36.0–46.0)
HEMOGLOBIN: 12.8 g/dL (ref 12.0–15.0)
MCH: 26.7 pg (ref 26.0–34.0)
MCHC: 31.2 g/dL (ref 30.0–36.0)
MCV: 85.6 fL (ref 78.0–100.0)
Platelets: 285 10*3/uL (ref 150–400)
RBC: 4.79 MIL/uL (ref 3.87–5.11)
RDW: 15.1 % (ref 11.5–15.5)
WBC: 9.2 10*3/uL (ref 4.0–10.5)

## 2016-10-10 LAB — BASIC METABOLIC PANEL
Anion gap: 10 (ref 5–15)
BUN: 9 mg/dL (ref 6–20)
CHLORIDE: 103 mmol/L (ref 101–111)
CO2: 28 mmol/L (ref 22–32)
CREATININE: 0.78 mg/dL (ref 0.44–1.00)
Calcium: 9.1 mg/dL (ref 8.9–10.3)
GFR calc non Af Amer: >60 mL/min (ref >60)
Glucose, Bld: 118 mg/dL — ABNORMAL HIGH (ref 65–99)
POTASSIUM: 3.5 mmol/L (ref 3.5–5.1)
Sodium: 141 mmol/L (ref 135–145)

## 2016-10-10 NOTE — ED Provider Notes (Signed)
[facility] DEPT Provider Note   CSN: [id number] Arrival date & time: 10/10/16  1537     History   Chief Complaint Chief Complaint  Patient presents with  . Cellulitis    HPI w Jenna Chandler is a 51 y.o. female with PMH/o Asthma, COPD, Fibromyalgia Who presents with 3 weeks of worsening right lower extremity edema, redness and pain. Patient reports that over the last few days, symptoms worsen. She notes that the right lower leg was very hot and warm to touch. Patient states that she noticed some worsening swelling just under her knee. Patient states that she took [place] which she had had from a previous prescription to help with symptoms. Patient states that she took 3 days of macrobid that she had been previously prescribed. She reports improvement in pain, swelling, and redness after macrobid use. Patient states that she still been able to ambulate. Patient reports that she has had a "soreness feeling "in her chest but not pain. Patient reports subjective fever today but did not measure any temperature. She has not taken any other medications for the symptoms. Patient has a baseline history of shortness of breath secondary to her COPD. She was recently on 2 L of oxygen but her doctor removed it. Patient states that she would use the O2 when walking around the house. Patient denies any abdominal pain, nausea/vomiting, dysuria, hematuria.     The history is provided by the patient.    Past Medical History:  Diagnosis Date  . Asthma   . Chronic diastolic CHF (congestive heart failure) ([place])   . COPD (chronic obstructive pulmonary disease) ([name])    +/- asthma   . Diabetes mellitus without complication ([place])   . Enlarged heart   . Fibromyalgia   . Hypertension   . Manic, depressive ([place])   . Morbid obesity ([place])   . OSA (obstructive sleep apnea)     Patient Active Problem List   Diagnosis Date Noted  . SOB (shortness of breath) 05/05/2014  . Chest pain 05/05/2014  .  Chronic respiratory failure ([place]) 04/26/2012  . OSA (obstructive sleep apnea) 06/02/2011  . Lethargy 06/02/2011  . Diastolic CHF, chronic ([place]) 06/01/2011  . 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 HTN (hypertension) 05/11/2011  . Morbid obesity ([place]) 05/11/2011  . Normocytic anemia 05/11/2011  . Manic, depressive ([place])     Past Surgical History:  Procedure Laterality Date  . NO PAST SURGERIES      OB History    Gravida Para Term Preterm AB Living   1 1 1     1    SAB TAB Ectopic Multiple Live Births           1       Home Medications    Prior to Admission medications   Medication Sig Start Date End Date Taking? Authorizing Provider  albuterol (PROVENTIL HFA;VENTOLIN HFA) 108 (90 BASE) MCG/ACT inhaler Inhale 2 puffs into the lungs every 6 (six) hours as needed. wheezing 12/29/13  Yes Elsie Stain, MD  albuterol (PROVENTIL) (2.5 MG/3ML) 0.083% nebulizer solution Take 3 mLs (2.5 mg total) by nebulization every 6 (six) hours as needed. 11/17/14  Yes Dorie Rank, MD  amLODipine (NORVASC) 10 MG tablet Take 10 mg by mouth 2 (two) times daily. 12/23/14  Yes [provider]  cloNIDine (CATAPRES) 0.3 MG tablet Take 1 tablet (0.3 mg total) by mouth 3 (three) times daily. 01/16/13  Yes Sueanne Margarita, MD  cyclobenzaprine (FLEXERIL)  10 MG tablet Take 1 tablet (10 mg total) by mouth 2 (two) times daily as needed for muscle spasms. 08/26/13  Yes Kirichenko, Tatyana, PA-C  furosemide (LASIX) 40 MG tablet Take 80 mg by mouth 2 (two) times daily. 05/18/11  Yes Black, Lezlie Octave, NP  gabapentin (NEURONTIN) 300 MG capsule Take 300 mg by mouth 2 (two) times daily. 09/12/16  Yes [provider]  ibuprofen (ADVIL,MOTRIN) 800 MG tablet Take 800 mg by mouth every 6 (six) hours as needed. AS NEEDED FOR MODERATE PAIN 12/22/14  Yes [provider]  losartan (COZAAR) 50 MG tablet Take 50 mg by mouth daily. 09/12/16  Yes [provider]  nitrofurantoin,  macrocrystal-monohydrate, (MACROBID) 100 MG capsule Take 100 mg by mouth 2 (two) times daily. 08/23/16  Yes [provider]  traZODone (DESYREL) 100 MG tablet Take 100 mg by mouth at bedtime as needed for sleep.   Yes [provider]  cephALEXin (KEFLEX) 500 MG capsule Take 1 capsule (500 mg total) by mouth 4 (four) times daily. 10/11/16   Volanda Napoleon, PA-C  eszopiclone (LUNESTA) 2 MG TABS tablet Take 1 tablet (2 mg total) by mouth at bedtime as needed for sleep. Take immediately before bedtime 08/30/16   Sueanne Margarita, MD    Family History Family History  Problem Relation Age of Onset  . Cancer Mother     Social History Social History  Substance Use Topics  . Smoking status: Former Smoker    Packs/day: 2.00    Years: 15.00    Types: Cigarettes    Quit date: 05/24/2002  . Smokeless tobacco: Never Used  . Alcohol use No     Allergies   Lisinopril and Levaquin [levofloxacin]   Review of Systems Review of Systems  Constitutional: Positive for fever. Negative for chills.  Eyes: Negative for visual disturbance.  Respiratory: Negative for shortness of breath.   Cardiovascular: Positive for leg swelling. Negative for chest pain.  Gastrointestinal: Negative for abdominal pain, diarrhea, nausea and vomiting.  Musculoskeletal: Negative for back pain and neck pain.  Skin: Positive for color change. Negative for rash.  Neurological: Negative for weakness, numbness and headaches.     Physical Exam Updated Vital Signs BP (!) 159/100   Pulse 77   Temp 98.8 F (37.1 C) (Oral)   Resp 16   SpO2 97%   Physical Exam  Constitutional: She is oriented to person, place, and time. She appears well-developed and well-nourished.  Sitting comfortably on examination table  HENT:  Head: Normocephalic and atraumatic.  Mouth/Throat: Oropharynx is clear and moist and mucous membranes are normal.  Eyes: Pupils are equal, round, and reactive to light. Conjunctivae, EOM and  lids are normal.  Neck: Full passive range of motion without pain.  Cardiovascular: Normal rate, regular rhythm, normal heart sounds and normal pulses.  Exam reveals no gallop and no friction rub.   No murmur heard. Pulses:      Dorsalis pedis pulses are 2+ on the right side, and 2+ on the left side.  Pulmonary/Chest: Effort normal and breath sounds normal. No tachypnea. No respiratory distress.  No evidence of respiratory distress. Able to speak in full sentences without difficulty.  Abdominal: Soft. Normal appearance. There is no tenderness. There is no rigidity and no guarding.  Musculoskeletal: Normal range of motion.  2+ pitting edema to bilateral lower extremities that begins at the foot and extends to the mid calf region. Mild soft tissue swelling to the anterior aspect of the  proximal tib/fib just inferior to the right knee.   Neurological: She is alert and oriented to person, place, and time.  Cranial nerves III-XII intact Follows commands, Moves all extremities  5/5 strength to BUE and BLE  Sensation intact throughout all major nerve distributions Normal finger to nose. No dysdiadochokinesia. No pronator drift No slurred speech. No facial droop.   Skin: Skin is warm and dry. Capillary refill takes less than 2 seconds.  Bilateral lower extremities exhibits chronic venous stasis changes. Both of them have overlying erythema with right slightly greater than left. The right lower extremity is not warm to touch.  Psychiatric: She has a normal mood and affect. Her speech is normal.  Nursing note and vitals reviewed.    ED Treatments / Results  Labs (all labs ordered are listed, but only abnormal results are displayed) Labs Reviewed  BASIC METABOLIC PANEL - Abnormal; Notable for the following:       Result Value   Glucose, Bld 118 (*)    All other components within normal limits  CBC  I-STAT TROPONIN, ED    EKG  EKG Interpretation  Date/Time:  Tuesday October 10 2016  23:45:29 EDT Ventricular Rate:  77 PR Interval:    QRS Duration: 98 QT Interval:  401 QTC Calculation: 510 R Axis:   25 Text Interpretation:  Sinus rhythm Probable left ventricular hypertrophy Baseline wander in lead(s) II No significant change since last tracing Confirmed by Deno Etienne [phone]) on 10/10/2016 11:47:28 PM Also confirmed by Deno Etienne [phone]), editor Drema Pry [phone])  on 10/11/2016 7:32:40 AM       Radiology Dg Chest 2 View  Result Date: 10/10/2016 CLINICAL DATA:  Chest pain.  Shortness of breath. EXAM: CHEST  2 VIEW COMPARISON:  Radiographs and CT 02/09/2016 FINDINGS: The cardiomediastinal contours are normal. The lungs are clear. Pulmonary vasculature is normal. No consolidation, pleural effusion, or pneumothorax. No acute osseous abnormalities are seen. Soft tissue attenuation from body habitus limits detailed assessment. IMPRESSION: No acute abnormality.  No change from prior exam. Electronically Signed   By: Jeb Levering M.D.   On: 10/10/2016 22:55    Procedures Procedures (including critical care time)  Medications Ordered in ED Medications  cephALEXin (KEFLEX) capsule 500 mg (500 mg Oral Given 10/11/16 0101)     Initial Impression / Assessment and Plan / ED Course  I have reviewed the triage vital signs and the nursing notes.  Pertinent labs & imaging results that were available during my care of the patient were reviewed by me and considered in my medical decision making (see chart for details).     51 year old female who presents with worsening right lower extremity edema and redness. Reports baseline history of BLE edema but has noticed in the last few weeks the RLE has worsened. Took 3 days worth of macrobid and had improvement in symptoms. Patient is afebrile, non-toxic appearing, sitting comfortably on examination table. Vital signs reviewed. Patient hypertensive, likely secondary to pain. She states that she took her hypertension medications  prior to ED arrival. Patient denying any headache, vision changes, CP, SOB, numbness/weakness at this time. Patient has some chest soreness but otherwise no pain. Patient took 3 days of Macrobid that she had previously prescribed and had improvement in symptoms. Physical exam shows bilateral lower extremity edema that consists dorsal aspect of the feet and extends approximately to mid calf region. Mild overlying erythema to both bilateral lower extremities. Consider cellulitis versus DVT versus chronic edema versus CHF. Strong  suspicion for cellulitis given presentation and improvement with use of antibiotics. Will plan to obtain basic labs including CBC, BMP, EKG, chest x-ray.  BMP unremarkable. CBC unremarkable. No elevation of white blood cell count. I-STAT troponin is negative. Chest x-rays negative for any acute infectious in origin. EKG showed sinus rhythm rate 77. Blood pressure has improved. Vitals have remained stable. Per tech, patient had an episode where her O2 sat dropped to 85% as she was walking across the room. Returned to 97% on RA when patient was at rest. Per patient she was previously supposed to be on 2 L of home O2 which she states that she would use if she would get up and walk around the house. Patient states that her pulmonologist recently took her off of it but is unclear why. Throughout all discussions, patient's O2 sats are greater than 95% on room air. Patient is able to get up and walk and turn around to show me her legs without drop in O2 sat. She will likely need to be replaced back on her home O2, secondary to morbid obesity and pre-existing lung disease. Patient does have a CPAP machine at home that she uses.  Discussed patient with Dr. Tyrone Nine after evaluation of the patient. We'll plan to treat as presumptive cellulitis. Will plan to discharge patient home on antiemetic therapy. Will also plan to have patient follow-up at [facility] tomorrow morning for lower extremity venous  ultrasound to rule out DVT. Certainly patient to follow-up with, and Hospital tomorrow morning for ultrasound evaluation. Patient expresses understanding and agreement to plan. Patient instructed follow-up with her pulmonologist in the next 24-48 hours to discuss restarting her home O2 therapy. Return precautions discussed. Patient expresses understanding and agreement to plan.  Final Clinical Impressions(s) / ED Diagnoses   Final diagnoses:  Cellulitis of right leg  Leg edema, right    New Prescriptions Discharge Medication List as of 10/11/2016 12:59 AM    START taking these medications   Details  cephALEXin (KEFLEX) 500 MG capsule Take 1 capsule (500 mg total) by mouth 4 (four) times daily., Starting Wed 10/11/2016, Print         Volanda Napoleon, PA-C 10/11/16 [place], [place], DO 10/11/16 2202

## 2016-10-10 NOTE — ED Triage Notes (Signed)
Per pt, states right lower extremity swelling, redness-states history of cellulitis-no fever at this time

## 2016-10-11 ENCOUNTER — Ambulatory Visit (HOSPITAL_BASED_OUTPATIENT_CLINIC_OR_DEPARTMENT_OTHER)
Admission: RE | Admit: 2016-10-11 | Discharge: 2016-10-11 | Disposition: A | Discharge status: Home / Self Care | Payer: Medicaid Other | Source: Ambulatory Visit | Attending: Emergency Medicine | Admitting: Emergency Medicine

## 2016-10-11 DIAGNOSIS — M7989 Other specified soft tissue disorders: Secondary | ICD-10-CM

## 2016-10-11 DIAGNOSIS — R609 Edema, unspecified: Secondary | ICD-10-CM | POA: Diagnosis not present

## 2016-10-11 LAB — I-STAT TROPONIN, ED: TROPONIN I, POC: 0.01 ng/mL (ref 0.00–0.08)

## 2016-10-11 MED ORDER — CEPHALEXIN 500 MG PO CAPS
500.0000 mg | ORAL_CAPSULE | Freq: Once | ORAL | Status: AC
  Administered 2016-10-11: 500 mg via ORAL
  Filled 2016-10-11: qty 1

## 2016-10-11 MED ORDER — CEPHALEXIN 500 MG PO CAPS: 500.0000 mg | ORAL_CAPSULE | Freq: Four times a day (QID) | ORAL | 0 refills | Status: DC

## 2016-10-11 NOTE — Discharge Instructions (Addendum)
Take antibiotics as directed. Please take all of your antibiotics until finished.  As we discussed, he will go to [facility] tomorrow morning at 8 AM for an ultrasound of the right lower leg. He will go to the main hospital and tell them you will need to get an ultrasound for further evaluation.  Follow-up with your primary care doctor in the next 24-48 hours for further evaluation.  As we discussed return to Emergency Department for any worsening pain, redness/swelling of the leg, Chest pain, difficulty breathing, nausea/vomiting, or any other worsening concerns.

## 2016-10-11 NOTE — Progress Notes (Signed)
**  Preliminary report by tech**  Right lower extremity venous duplex complete. There is no evidence of deep or superficial vein thrombosis involving the right lower extremity. All visualized vessels appear patent and compressible. There is no evidence of a Baker's cyst on the right.  10/11/16 9:47 AM Carlos Levering RVT

## 2016-10-31 ENCOUNTER — Encounter (HOSPITAL_BASED_OUTPATIENT_CLINIC_OR_DEPARTMENT_OTHER): Payer: Medicaid Other

## 2016-11-03 ENCOUNTER — Encounter (HOSPITAL_BASED_OUTPATIENT_CLINIC_OR_DEPARTMENT_OTHER): Payer: Medicaid Other

## 2016-11-25 ENCOUNTER — Encounter (HOSPITAL_BASED_OUTPATIENT_CLINIC_OR_DEPARTMENT_OTHER): Payer: Medicaid Other

## 2016-12-22 ENCOUNTER — Encounter (HOSPITAL_BASED_OUTPATIENT_CLINIC_OR_DEPARTMENT_OTHER): Payer: Medicaid Other

## 2016-12-26 ENCOUNTER — Ambulatory Visit: Payer: Medicaid Other | Admitting: Pulmonary Disease

## 2017-03-01 ENCOUNTER — Ambulatory Visit (HOSPITAL_COMMUNITY)
Admission: EM | Admit: 2017-03-01 | Discharge: 2017-03-01 | Disposition: A | Discharge status: Home / Self Care | Payer: Medicaid Other | Attending: Family Medicine | Admitting: Family Medicine

## 2017-03-01 ENCOUNTER — Encounter (HOSPITAL_COMMUNITY): Payer: Self-pay | Admitting: Emergency Medicine

## 2017-03-01 ENCOUNTER — Other Ambulatory Visit: Payer: Self-pay

## 2017-03-01 DIAGNOSIS — L03311 Cellulitis of abdominal wall: Secondary | ICD-10-CM | POA: Diagnosis not present

## 2017-03-01 MED ORDER — DOXYCYCLINE HYCLATE 100 MG PO TABS: 100.0000 mg | ORAL_TABLET | Freq: Two times a day (BID) | ORAL | 0 refills | Status: DC

## 2017-03-01 MED ORDER — KETOCONAZOLE 200 MG PO TABS: 200.0000 mg | ORAL_TABLET | Freq: Every day | ORAL | 0 refills | Status: DC

## 2017-03-01 NOTE — ED Provider Notes (Signed)
[place] [facility]   [id number] 03/01/17 Arrival Time: 1419   SUBJECTIVE:  Jenna Chandler is a 52 y.o. female who presents to the urgent care with complaint of redness, warm, painful abdominal wall .  Patient concerned for cellulitis.  Patient noticed this area 3 weeks ago  Past Medical History:  Diagnosis Date  . Asthma   . Chronic diastolic CHF (congestive heart failure) ([place])   . COPD (chronic obstructive pulmonary disease) ([name])    +/- asthma   . Diabetes mellitus without complication ([place])   . Enlarged heart   . Fibromyalgia   . Hypertension   . Manic, depressive ([place])   . Morbid obesity ([place])   . OSA (obstructive sleep apnea)    Family History  Problem Relation Age of Onset  . Cancer Mother    Social History   Socioeconomic History  . Marital status: Single    Spouse name: Not on file  . Number of children: Not on file  . Years of education: Not on file  . Highest education level: Not on file  Social Needs  . Financial resource strain: Not on file  . Food insecurity - worry: Not on file  . Food insecurity - inability: Not on file  . Transportation needs - medical: Not on file  . Transportation needs - non-medical: Not on file  Occupational History  . Not on file  Tobacco Use  . Smoking status: Former Smoker    Packs/day: 2.00    Years: 15.00    Pack years: 30.00    Types: Cigarettes    Last attempt to quit: 05/24/2002    Years since quitting: 14.7  . Smokeless tobacco: Never Used  Substance and Sexual Activity  . Alcohol use: No  . Drug use: No  . Sexual activity: Not Currently    Birth control/protection: Abstinence  Other Topics Concern  . Not on file  Social History Narrative  . Not on file   No outpatient medications have been marked as taking for the 03/01/17 encounter [facility] Encounter).   Allergies  Allergen Reactions  . Lisinopril Cough  . Levaquin [Levofloxacin]     Nauseated       ROS: As per HPI, remainder of ROS  negative.   OBJECTIVE:   Vitals:   03/01/17 1434  BP: (!) 170/109  Pulse: 96  Resp: (!) 28  Temp: 97.6 F (36.4 C)  TempSrc: Oral  SpO2: 96%     General appearance: alert; no distress; morbidly obese with large abdominal panniculus Eyes: PERRL; EOMI; conjunctiva normal HENT: normocephalic; atraumatic;  Neck: supple Abdomen: soft, non-tender; bowel sounds normal; no masses or organomegaly; no guarding or rebound tenderness; hyperpigmented intertriginous panniculus Back: no CVA tenderness Extremities: no cyanosis or edema; symmetrical with no gross deformities Skin: warm and dry; inflamed panniculus Neurologic: normal gait; grossly normal Psychological: alert and cooperative; normal mood and affect      Labs:  Results for orders placed or performed during the hospital encounter of 10/10/16  CBC  Result Value Ref Range   WBC 9.2 4.0 - 10.5 K/uL   RBC 4.79 3.87 - 5.11 MIL/uL   Hemoglobin 12.8 12.0 - 15.0 g/dL   HCT 41.0 36.0 - 46.0 %   MCV 85.6 78.0 - 100.0 fL   MCH 26.7 26.0 - 34.0 pg   MCHC 31.2 30.0 - 36.0 g/dL   RDW 15.1 11.5 - 15.5 %   Platelets 285 150 - 400 K/uL  Basic metabolic panel  Result  Value Ref Range   Sodium 141 135 - 145 mmol/L   Potassium 3.5 3.5 - 5.1 mmol/L   Chloride 103 101 - 111 mmol/L   CO2 28 22 - 32 mmol/L   Glucose, Bld 118 (H) 65 - 99 mg/dL   BUN 9 6 - 20 mg/dL   Creatinine, Ser 0.78 0.44 - 1.00 mg/dL   Calcium 9.1 8.9 - 10.3 mg/dL   GFR calc non Af Amer >60 >60 mL/min   GFR calc Af Amer >60 >60 mL/min   Anion gap 10 5 - 15  I-Stat Troponin, ED (not at [facility])  Result Value Ref Range   Troponin i, poc 0.01 0.00 - 0.08 ng/mL   Comment 3            Labs Reviewed - No data to display  No results found.     ASSESSMENT & PLAN:  1. Cellulitis of abdominal wall     Meds ordered this encounter  Medications  . doxycycline (VIBRA-TABS) 100 MG tablet    Sig: Take 1 tablet (100 mg total) by mouth 2 (two) times daily.     Dispense:  20 tablet    Refill:  0  . ketoconazole (NIZORAL) 200 MG tablet    Sig: Take 1 tablet (200 mg total) by mouth daily.    Dispense:  7 tablet    Refill:  0    Reviewed expectations re: course of current medical issues. Questions answered. Outlined signs and symptoms indicating need for more acute intervention. Patient verbalized understanding. After Visit Summary given.   Gently wash the abdomen with soap and water twice daily.   Robyn Haber, MD 03/01/17 1512

## 2017-03-01 NOTE — ED Triage Notes (Signed)
Lower abdomen with slight redness, warm, painful area .  Patient concerned for cellulitis.  Patient noticed this area 3 weeks ago

## 2017-03-01 NOTE — Discharge Instructions (Signed)
Gently wash the abdomen with soap and water twice daily.

## 2017-04-05 ENCOUNTER — Ambulatory Visit: Payer: Medicaid Other | Admitting: Pulmonary Disease

## 2017-04-18 ENCOUNTER — Encounter: Payer: Self-pay | Admitting: Pulmonary Disease

## 2017-04-18 ENCOUNTER — Ambulatory Visit (INDEPENDENT_AMBULATORY_CARE_PROVIDER_SITE_OTHER): Payer: Self-pay | Admitting: Pulmonary Disease

## 2017-04-18 VITALS — BP 124/60 | HR 104 | Ht 64.0 in | Wt >= 6400 oz

## 2017-04-18 DIAGNOSIS — J42 Unspecified chronic bronchitis: Secondary | ICD-10-CM

## 2017-04-18 MED ORDER — CEPHALEXIN 500 MG PO CAPS: 500.0000 mg | ORAL_CAPSULE | Freq: Two times a day (BID) | ORAL | 0 refills | Status: DC

## 2017-04-18 MED ORDER — BENZONATATE 200 MG PO CAPS: 200.0000 mg | ORAL_CAPSULE | Freq: Three times a day (TID) | ORAL | 1 refills | Status: DC | PRN

## 2017-04-18 NOTE — Patient Instructions (Signed)
I will start you on cephalexin 500 mg twice daily for 10 days Continue to work on weight loss and exercise Follow-up in 6 months.

## 2017-04-18 NOTE — Progress Notes (Signed)
Jenna Chandler    [id number]    1965/09/30  Primary Care Physician:Avbuere, Christean Grief, MD  Referring Physician: Nolene Ebbs, MD [address] [place], [place] [address]  Chief complaint:   COPD with small airways disease. Chronic bronchitis. Morbid obesity OSA on CPAP  HPI: Jenna Chandler is here for follow-up of her COPD,chronic bronchitis. She is a former patient of Dr. Joya Gaskins. She associates with morbid obesity, COPD, bronchitis. She has recurrent attacks of bronchitis and needs to go on antibiotics frequently. She was last given multiple courses of antibiotic over Fall season including doxycycline, Z-Pak.  Today she states that her dyspnea is worsened over last few weeks with increasing cough, dark sputum production. She has some chills but no objective fevers. She has OSA and is on CPAP. She uses this every day. She is on O2 at night to the CPAP and also during the daytime on exertion.  She smoked for about 30-pack-year. She quit in 2004.  Interim history: Complains of cough with green, brown sputum production for the past 1 week.  No chills, fevers.  Outpatient Encounter Medications as of 04/18/2017  Medication Sig  . albuterol (PROVENTIL HFA;VENTOLIN HFA) 108 (90 BASE) MCG/ACT inhaler Inhale 2 puffs into the lungs every 6 (six) hours as needed. wheezing  . albuterol (PROVENTIL) (2.5 MG/3ML) 0.083% nebulizer solution Take 3 mLs (2.5 mg total) by nebulization every 6 (six) hours as needed.  Marland Kitchen amLODipine (NORVASC) 10 MG tablet Take 10 mg by mouth 2 (two) times daily.  . cloNIDine (CATAPRES) 0.3 MG tablet Take 1 tablet (0.3 mg total) by mouth 3 (three) times daily.  . cyclobenzaprine (FLEXERIL) 10 MG tablet Take 1 tablet (10 mg total) by mouth 2 (two) times daily as needed for muscle spasms.  . furosemide (LASIX) 40 MG tablet Take 80 mg by mouth 2 (two) times daily.  Marland Kitchen gabapentin (NEURONTIN) 300 MG capsule Take 300 mg by mouth 2 (two) times daily.  Marland Kitchen ibuprofen  (ADVIL,MOTRIN) 800 MG tablet Take 800 mg by mouth every 6 (six) hours as needed. AS NEEDED FOR MODERATE PAIN  . losartan (COZAAR) 50 MG tablet Take 50 mg by mouth daily.  . traZODone (DESYREL) 100 MG tablet Take 100 mg by mouth at bedtime as needed for sleep.  . [DISCONTINUED] doxycycline (VIBRA-TABS) 100 MG tablet Take 1 tablet (100 mg total) by mouth 2 (two) times daily. (Patient not taking: Reported on 04/18/2017)  . [DISCONTINUED] eszopiclone (LUNESTA) 2 MG TABS tablet Take 1 tablet (2 mg total) by mouth at bedtime as needed for sleep. Take immediately before bedtime (Patient not taking: Reported on 04/18/2017)  . [DISCONTINUED] ketoconazole (NIZORAL) 200 MG tablet Take 1 tablet (200 mg total) by mouth daily. (Patient not taking: Reported on 04/18/2017)   No facility-administered encounter medications on file as of 04/18/2017.     Allergies as of 04/18/2017 - Review Complete 04/18/2017  Allergen Reaction Noted  . Lisinopril Cough 06/16/2016  . Levaquin [levofloxacin]      Past Medical History:  Diagnosis Date  . Asthma   . Chronic diastolic CHF (congestive heart failure) ([place])   . COPD (chronic obstructive pulmonary disease) ([name])    +/- asthma   . Diabetes mellitus without complication ([place])   . Enlarged heart   . Fibromyalgia   . Hypertension   . Manic, depressive ([place])   . Morbid obesity ([place])   . OSA (obstructive sleep apnea)     Past Surgical History:  Procedure  Laterality Date  . NO PAST SURGERIES      Family History  Problem Relation Age of Onset  . Cancer Mother     Social History   Socioeconomic History  . Marital status: Single    Spouse name: Not on file  . Number of children: Not on file  . Years of education: Not on file  . Highest education level: Not on file  Social Needs  . Financial resource strain: Not on file  . Food insecurity - worry: Not on file  . Food insecurity - inability: Not on file  . Transportation needs - medical: Not on file  .  Transportation needs - non-medical: Not on file  Occupational History  . Not on file  Tobacco Use  . Smoking status: Former Smoker    Packs/day: 2.00    Years: 15.00    Pack years: 30.00    Types: Cigarettes    Last attempt to quit: 05/24/2002    Years since quitting: 14.9  . Smokeless tobacco: Never Used  Substance and Sexual Activity  . Alcohol use: No  . Drug use: No  . Sexual activity: Not Currently    Birth control/protection: Abstinence  Other Topics Concern  . Not on file  Social History Narrative  . Not on file    Review of systems: Review of Systems  Constitutional: Negative for fever and chills.  HENT: Negative.   Eyes: Negative for blurred vision.  Respiratory: as per HPI  Cardiovascular: Negative for chest pain and palpitations.  Gastrointestinal: Negative for vomiting, diarrhea, blood per rectum. Genitourinary: Negative for dysuria, urgency, frequency and hematuria.  Musculoskeletal: Negative for myalgias, back pain and joint pain.  Skin: Negative for itching and rash.  Neurological: Negative for dizziness, tremors, focal weakness, seizures and loss of consciousness.  Endo/Heme/Allergies: Negative for environmental allergies.  Psychiatric/Behavioral: Negative for depression, suicidal ideas and hallucinations.  All other systems reviewed and are negative.  Physical Exam: Blood pressure 124/60, pulse (!) 104, height 5\' 4"  (1.626 m), weight (!) 454 lb (205.9 kg), SpO2 96 %. Gen:      No acute distress HEENT:  EOMI, sclera anicteric Neck:     No masses; no thyromegaly Lungs:    Clear to auscultation bilaterally; normal respiratory effort CV:         Regular rate and rhythm; no murmurs Abd:      + bowel sounds; soft, non-tender; no palpable masses, no distension Ext:    No edema; adequate peripheral perfusion Skin:      Warm and dry; no rash Neuro: alert and oriented x 3 Psych: normal mood and affect  Data Reviewed: PFTs 08/14/11  FVC 1.42 (42%) FEV1 1.06  (41%) F/F 74 FEF 25-75 percent 0.86 [29%] TLC 57% DLCO 36% Minimal obstruction, small airway disease as shown by reduction in mid flow rates. There is a significant response in mod flow rates after albuterol. Moderate restriction and severe DLCO production. DLCO corrects for alveolar volume.  CT angiogram 02/09/16- No pulmonary embolism, no lung abnormalities identified Chest x-ray 10/10/16-clear lungs.  No acute abnormality. I have reviewed the images personally.  Assessment:  COPD Chronic bronchitis [name] continues to have reccurent issues with COPD, chronic bronchitis, recurrent infections. I suspect that her obesity is a major contributor to her symptoms.  She is back in the clinic with another episode of bronchitis Give her a course of cefazolin she states Z-Pak and Levaquin were not effective in the past. She will need PFTs after  recovery from a more recent assessment of her lung function.  OSA On nocturnal oxygen and CPAP. She follows with Dr. Radford Pax for management.  Morbid obesity Working on weight loss with diet and exercise.  Plan/Recommendations: - Cefazolin - Continue current inhalers - Work on weight loss.  Marshell Garfinkel MD [facility] Pager [phone] 04/18/2017, 4:52 PM  CC: Nolene Ebbs, MD

## 2017-05-04 ENCOUNTER — Telehealth: Payer: Self-pay | Admitting: Pulmonary Disease

## 2017-05-04 MED ORDER — AMOXICILLIN-POT CLAVULANATE 875-125 MG PO TABS: 1.0000 | ORAL_TABLET | Freq: Two times a day (BID) | ORAL | 0 refills | Status: DC

## 2017-05-04 NOTE — Telephone Encounter (Signed)
Don't think another round of antibiotics will help. Ask her to use mucinex OTC 1200 mg bid, stay well hydrated

## 2017-05-04 NOTE — Telephone Encounter (Signed)
Spoke to patient. She stated that she has been taking Mucinex DM, Tylenol Cold & Flu and Delsym and she is not receiving any relief. She needs something stronger than an abx.   Dr. Vaughan Browner, please advise. Thanks!

## 2017-05-04 NOTE — Telephone Encounter (Signed)
Call in augmentin 875 mg bid for 7 days

## 2017-05-04 NOTE — Telephone Encounter (Signed)
Called pt letting her know I was sending script of Augmentin to her pharmacy for her to take 1 tab bid for 1 week.  Pt expressed understanding. Nothing further needed at this time.

## 2017-05-04 NOTE — Telephone Encounter (Signed)
Spoke with patient. She stated that she was seen on 04/18/17 by Dr. Vaughan Browner and was given cephalexin 500mg  and Tessalon perles for her cough. The cephalexin helped but she was unable to get the perles due to her insurance not paying for them.   However, the cough has returned. Productive cough with thick, brown phlegm especially in the evenings. She feels like she is choking at times with the cough. Only has SOB during the coughing spells. Denies fever.   She wants to know if she needs another round of cephalexin.   She wishes to use [facility] on [address].   Dr. Vaughan Browner, please advise. Thanks!

## 2017-05-31 ENCOUNTER — Encounter: Payer: Self-pay | Admitting: Cardiology

## 2017-06-18 NOTE — Telephone Encounter (Addendum)
Patient states she needs a new cpap. Reached out to [facility] to see if patient is eligible for a new unit.    Per [facility] Scientist, water quality) patient is eligible for a new CPAP. Message sent to Dr Radford Pax for new cpap orders .  Pt is aware and agreeable to treatment.

## 2017-06-19 ENCOUNTER — Ambulatory Visit: Payer: Medicaid Other | Admitting: Cardiology

## 2017-06-25 ENCOUNTER — Emergency Department (HOSPITAL_COMMUNITY): Payer: Medicaid Other

## 2017-06-25 ENCOUNTER — Emergency Department (HOSPITAL_COMMUNITY)
Admission: EM | Admit: 2017-06-25 | Discharge: 2017-06-25 | Disposition: A | Discharge status: Home / Self Care | Payer: Medicaid Other | Attending: Emergency Medicine | Admitting: Emergency Medicine

## 2017-06-25 ENCOUNTER — Encounter (HOSPITAL_COMMUNITY): Payer: Self-pay

## 2017-06-25 DIAGNOSIS — I5032 Chronic diastolic (congestive) heart failure: Secondary | ICD-10-CM | POA: Insufficient documentation

## 2017-06-25 DIAGNOSIS — R1011 Right upper quadrant pain: Secondary | ICD-10-CM | POA: Diagnosis present

## 2017-06-25 DIAGNOSIS — E119 Type 2 diabetes mellitus without complications: Secondary | ICD-10-CM | POA: Diagnosis not present

## 2017-06-25 DIAGNOSIS — Z79899 Other long term (current) drug therapy: Secondary | ICD-10-CM | POA: Insufficient documentation

## 2017-06-25 DIAGNOSIS — Z87891 Personal history of nicotine dependence: Secondary | ICD-10-CM | POA: Diagnosis not present

## 2017-06-25 DIAGNOSIS — J449 Chronic obstructive pulmonary disease, unspecified: Secondary | ICD-10-CM | POA: Insufficient documentation

## 2017-06-25 DIAGNOSIS — R1084 Generalized abdominal pain: Secondary | ICD-10-CM | POA: Diagnosis not present

## 2017-06-25 DIAGNOSIS — L03319 Cellulitis of trunk, unspecified: Secondary | ICD-10-CM | POA: Diagnosis not present

## 2017-06-25 DIAGNOSIS — I110 Hypertensive heart disease with heart failure: Secondary | ICD-10-CM | POA: Diagnosis not present

## 2017-06-25 LAB — I-STAT BETA HCG BLOOD, ED (MC, WL, AP ONLY): I-stat hCG, quantitative: <5 m[IU]/mL (ref <5)

## 2017-06-25 LAB — CBC WITH DIFFERENTIAL/PLATELET
Abs Immature Granulocytes: 0 10*3/uL (ref 0.0–0.1)
Basophils Absolute: 0 10*3/uL (ref 0.0–0.1)
Basophils Relative: 1 %
EOS PCT: 3 %
Eosinophils Absolute: 0.2 10*3/uL (ref 0.0–0.7)
HEMATOCRIT: 43.2 % (ref 36.0–46.0)
HEMOGLOBIN: 13.1 g/dL (ref 12.0–15.0)
Immature Granulocytes: 1 %
LYMPHS ABS: 2.1 10*3/uL (ref 0.7–4.0)
LYMPHS PCT: 32 %
MCH: 26 pg (ref 26.0–34.0)
MCHC: 30.3 g/dL (ref 30.0–36.0)
MCV: 85.7 fL (ref 78.0–100.0)
MONO ABS: 0.4 10*3/uL (ref 0.1–1.0)
Monocytes Relative: 6 %
Neutro Abs: 3.8 10*3/uL (ref 1.7–7.7)
Neutrophils Relative %: 59 %
Platelets: 305 10*3/uL (ref 150–400)
RBC: 5.04 MIL/uL (ref 3.87–5.11)
RDW: 14.6 % (ref 11.5–15.5)
WBC: 6.4 10*3/uL (ref 4.0–10.5)

## 2017-06-25 LAB — URINALYSIS, ROUTINE W REFLEX MICROSCOPIC
Bilirubin Urine: NEGATIVE
GLUCOSE, UA: NEGATIVE mg/dL
KETONES UR: NEGATIVE mg/dL
Leukocytes, UA: NEGATIVE
NITRITE: NEGATIVE
PROTEIN: NEGATIVE mg/dL
Specific Gravity, Urine: 1.012 (ref 1.005–1.030)
pH: 6 (ref 5.0–8.0)

## 2017-06-25 LAB — COMPREHENSIVE METABOLIC PANEL
ALK PHOS: 74 U/L (ref 38–126)
ALT: 13 U/L — ABNORMAL LOW (ref 14–54)
ANION GAP: 11 (ref 5–15)
AST: 17 U/L (ref 15–41)
Albumin: 3.5 g/dL (ref 3.5–5.0)
BILIRUBIN TOTAL: 0.7 mg/dL (ref 0.3–1.2)
BUN: 5 mg/dL — ABNORMAL LOW (ref 6–20)
CO2: 25 mmol/L (ref 22–32)
Calcium: 8.8 mg/dL — ABNORMAL LOW (ref 8.9–10.3)
Chloride: 104 mmol/L (ref 101–111)
Creatinine, Ser: 0.62 mg/dL (ref 0.44–1.00)
GFR calc non Af Amer: >60 mL/min (ref >60)
GLUCOSE: 97 mg/dL (ref 65–99)
Potassium: 3.7 mmol/L (ref 3.5–5.1)
Sodium: 140 mmol/L (ref 135–145)
Total Protein: 7.3 g/dL (ref 6.5–8.1)

## 2017-06-25 LAB — LIPASE, BLOOD: Lipase: 20 U/L (ref 11–51)

## 2017-06-25 MED ORDER — IOHEXOL 300 MG/ML  SOLN
100.0000 mL | Freq: Once | INTRAMUSCULAR | Status: AC | PRN
  Administered 2017-06-25: 100 mL via INTRAVENOUS

## 2017-06-25 MED ORDER — TRAMADOL HCL 50 MG PO TABS: 50.0000 mg | ORAL_TABLET | Freq: Four times a day (QID) | ORAL | 0 refills | Status: DC | PRN

## 2017-06-25 MED ORDER — DOXYCYCLINE HYCLATE 100 MG PO CAPS: 100.0000 mg | ORAL_CAPSULE | Freq: Two times a day (BID) | ORAL | 0 refills | Status: DC

## 2017-06-25 MED ORDER — DOXYCYCLINE HYCLATE 100 MG PO TABS
100.0000 mg | ORAL_TABLET | Freq: Once | ORAL | Status: AC
  Administered 2017-06-25: 100 mg via ORAL
  Filled 2017-06-25: qty 1

## 2017-06-25 MED ORDER — OXYCODONE-ACETAMINOPHEN 5-325 MG PO TABS
1.0000 | ORAL_TABLET | Freq: Once | ORAL | Status: AC
  Administered 2017-06-25: 1 via ORAL
  Filled 2017-06-25: qty 1

## 2017-06-25 NOTE — ED Provider Notes (Signed)
[facility] EMERGENCY DEPARTMENT Provider Note   CSN: [id number] Arrival date & time: 06/25/17  1247     History   Chief Complaint Chief Complaint  Patient presents with  . Flank Pain    HPI Jenna Chandler is a 52 y.o. female.  HPI   52 year old female presents today with complaints of abdominal pain.  She notes 3 days of right-sided abdominal pain both right upper and right lower.  Patient notes that the pain has persisted, worse with movement, worse with ambulation, and palpation.  Patient denies any dysuria or changes in her stool.  Patient notes that the pain radiates into the flank.  She denies any vaginal discharge or bleeding.  She notes ibuprofen has not improved her symptoms.  She denies any fever chills nausea or vomiting.  Patient does note that she recently started an exercise routine where she is bringing her knees up and down and marching in place and preparing to walk on a regular basis.  Past Medical History:  Diagnosis Date  . Asthma   . Chronic diastolic CHF (congestive heart failure) ([place])   . COPD (chronic obstructive pulmonary disease) ([name])    +/- asthma   . Diabetes mellitus without complication ([place])   . Enlarged heart   . Fibromyalgia   . Hypertension   . Manic, depressive ([place])   . Morbid obesity ([place])   . OSA (obstructive sleep apnea)     Patient Active Problem List   Diagnosis Date Noted  . SOB (shortness of breath) 05/05/2014  . Chest pain 05/05/2014  . Chronic respiratory failure ([place]) 04/26/2012  . OSA (obstructive sleep apnea) 06/02/2011  . Lethargy 06/02/2011  . Diastolic CHF, chronic ([place]) 06/01/2011  . 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 HTN (hypertension) 05/11/2011  . Morbid obesity ([place]) 05/11/2011  . Normocytic anemia 05/11/2011  . Manic, depressive ([place])     Past Surgical History:  Procedure Laterality Date  . NO PAST SURGERIES       OB History    Gravida  1   Para  1   Term  1    Preterm      AB      Living  1     SAB      TAB      Ectopic      Multiple      Live Births  1            Home Medications    Prior to Admission medications   Medication Sig Start Date End Date Taking? Authorizing Provider  albuterol (PROVENTIL HFA;VENTOLIN HFA) 108 (90 BASE) MCG/ACT inhaler Inhale 2 puffs into the lungs every 6 (six) hours as needed. wheezing 12/29/13  Yes Elsie Stain, MD  albuterol (PROVENTIL) (2.5 MG/3ML) 0.083% nebulizer solution Take 3 mLs (2.5 mg total) by nebulization every 6 (six) hours as needed. 11/17/14  Yes Dorie Rank, MD  amLODipine (NORVASC) 10 MG tablet Take 10 mg by mouth 2 (two) times daily. 12/23/14  Yes [provider]  cloNIDine (CATAPRES) 0.2 MG tablet Take 0.2 mg by mouth 2 (two) times daily. 05/25/17  Yes [provider]  cyclobenzaprine (FLEXERIL) 10 MG tablet Take 1 tablet (10 mg total) by mouth 2 (two) times daily as needed for muscle spasms. 08/26/13  Yes Kirichenko, Tatyana, PA-C  furosemide (LASIX) 40 MG tablet Take 80 mg by mouth 2 (two) times daily. 05/18/11  Yes Black, Lezlie Octave, NP  gabapentin (NEURONTIN) 300 MG capsule Take 300 mg by mouth 2 (two) times daily. 09/12/16  Yes [provider]  ibuprofen (ADVIL,MOTRIN) 800 MG tablet Take 800 mg by mouth every 6 (six) hours as needed. AS NEEDED FOR MODERATE PAIN 12/22/14  Yes [provider]  losartan (COZAAR) 50 MG tablet Take 50 mg by mouth daily. 09/12/16  Yes [provider]  traZODone (DESYREL) 100 MG tablet Take 100 mg by mouth at bedtime as needed for sleep.   Yes [provider]  doxycycline (VIBRAMYCIN) 100 MG capsule Take 1 capsule (100 mg total) by mouth 2 (two) times daily. 06/25/17   Madysen Faircloth, Dellis Filbert, PA-C  traMADol (ULTRAM) 50 MG tablet Take 1 tablet (50 mg total) by mouth every 6 (six) hours as needed. 06/25/17   Okey Regal, PA-C    Family History Family History  Problem Relation Age of Onset  . Cancer  Mother     Social History Social History   Tobacco Use  . Smoking status: Former Smoker    Packs/day: 2.00    Years: 15.00    Pack years: 30.00    Types: Cigarettes    Last attempt to quit: 05/24/2002    Years since quitting: 15.0  . Smokeless tobacco: Never Used  Substance Use Topics  . Alcohol use: No  . Drug use: No     Allergies   Lisinopril and Levaquin [levofloxacin]   Review of Systems Review of Systems  All other systems reviewed and are negative.  Physical Exam Updated Vital Signs BP (!) 161/98 (BP Location: Right Arm)   Pulse 67   Temp 98.4 F (36.9 C) (Oral)   Resp 16   SpO2 98%   Physical Exam  Constitutional: She is oriented to person, place, and time. She appears well-developed and well-nourished.  Morbidly obese  HENT:  Head: Normocephalic and atraumatic.  Eyes: Pupils are equal, round, and reactive to light. Conjunctivae are normal. Right eye exhibits no discharge. Left eye exhibits no discharge. No scleral icterus.  Neck: Normal range of motion. No JVD present. No tracheal deviation present.  Pulmonary/Chest: Effort normal. No stridor.  Abdominal:  Minor amount of erythema noted over the central abdomen below the umbilicus-no significant induration discharge or fluctuance-tenderness palpation of the abdomen both right upper and right lower, no left upper abdominal tenderness  Neurological: She is alert and oriented to person, place, and time. Coordination normal.  Psychiatric: She has a normal mood and affect. Her behavior is normal. Judgment and thought content normal.  Nursing note and vitals reviewed.    ED Treatments / Results  Labs (all labs ordered are listed, but only abnormal results are displayed) Labs Reviewed  COMPREHENSIVE METABOLIC PANEL - Abnormal; Notable for the following components:      Result Value   BUN 5 (*)    Calcium 8.8 (*)    ALT 13 (*)    All other components within normal limits  URINALYSIS, ROUTINE W REFLEX  MICROSCOPIC - Abnormal; Notable for the following components:   Hgb urine dipstick SMALL (*)    Bacteria, UA RARE (*)    All other components within normal limits  CBC WITH DIFFERENTIAL/PLATELET  LIPASE, BLOOD  I-STAT BETA HCG BLOOD, ED ([facility], [facility], [facility] ONLY)    EKG None  Radiology Ct Abdomen Pelvis W Contrast  Result Date: 06/25/2017 CLINICAL DATA:  Right lower quadrant and right flank pain for several days. Denies dysuria. Describes urinary frequency. EXAM: CT ABDOMEN AND PELVIS WITH CONTRAST TECHNIQUE:  Multidetector CT imaging of the abdomen and pelvis was performed using the standard protocol following bolus administration of intravenous contrast. CONTRAST:  111mL OMNIPAQUE IOHEXOL 300 MG/ML  SOLN COMPARISON:  07/31/2013 FINDINGS: Moderate degradation involving the pelvis and less so abdomen secondary to patient body habitus. Lower chest: Minimal atelectasis at the left lung base. Mild cardiomegaly, without pericardial or pleural effusion. Hepatobiliary: Hepatomegaly at 21.0 craniocaudal. Normal gallbladder, without biliary ductal dilatation. Pancreas: Normal, without mass or ductal dilatation. Spleen: Normal in size, without focal abnormality. Adrenals/Urinary Tract: Normal adrenal glands. Normal right kidney. Interpolar left renal lesion is not well characterized secondary to small lesion size and patient body habitus. 1.3 cm. No hydronephrosis. Bladder not well evaluated. Stomach/Bowel: Normal stomach, without wall thickening. Normal colon and terminal ileum. Appendix not well visualized. No gross pericecal inflammation identified. Normal small bowel. Vascular/Lymphatic: Grossly normal aorta and branch vessels. No abdominal and no gross pelvic adenopathy. Prominent inguinal nodes are likely reactive. Reproductive: Grossly normal uterus.  Adnexa not well evaluated. Other: No abdominopelvic ascites.  No free intraperitoneal air. Musculoskeletal: Lumbosacral spondylosis. IMPRESSION: 1. Moderately  degraded exam secondary to patient body habitus. 2. No gross abnormality to explain right-sided pain. Appendix not confidently identified but no gross right lower quadrant inflammation seen. 3. Hepatomegaly. Electronically Signed   By: Abigail Miyamoto M.D.   On: 06/25/2017 19:18    Procedures Procedures (including critical care time)  Medications Ordered in ED Medications  oxyCODONE-acetaminophen (PERCOCET/ROXICET) 5-325 MG per tablet 1 tablet (has no administration in time range)  doxycycline (VIBRA-TABS) tablet 100 mg (has no administration in time range)  iohexol (OMNIPAQUE) 300 MG/ML solution 100 mL (100 mLs Intravenous Contrast Given 06/25/17 1834)     Initial Impression / Assessment and Plan / ED Course  I have reviewed the triage vital signs and the nursing notes.  Pertinent labs & imaging results that were available during my care of the patient were reviewed by me and considered in my medical decision making (see chart for details).     Labs: I-STAT beta-hCG, urinalysis, CBC, CMP, lipase  Imaging: CT abdomen pelvis with contrast  Consults:  Therapeutics: Percocet doxycycline  Discharge Meds: Doxycycline, Ultram  Assessment/Plan: 52 year old female presents today with abdominal pain.  Uncertain etiology, her exam is very difficult given her morbid obesity.  Her work-up is reassuring with no signs of significant intra-abdominal infection or abnormality.  She is afebrile, no elevation in white count with nonfocal abdominal pain.  Her CT scan was degraded secondary to body habitus, but no acute abnormalities were noted.  I discussed these findings with the patient, she will be discharged with pain medication and strict return precautions given the fact that symptoms have persisted.  Patient will return in 2 days if symptoms persist sooner if they worsen.  Patient also had some minor cellulitis noted over the superficial abdomen, this is unlikely related to her presentation today, she  does have a history of the same, she will be treated with doxycycline for this.  Patient verbalized understanding and agreement to today's plan had no further questions or concerns the time discharge   Final Clinical Impressions(s) / ED Diagnoses   Final diagnoses:  Generalized abdominal pain  Cellulitis of trunk, unspecified site of trunk    ED Discharge Orders        Ordered    doxycycline (VIBRAMYCIN) 100 MG capsule  2 times daily     06/25/17 2120    traMADol (ULTRAM) 50 MG tablet  Every 6 hours  PRN     06/25/17 2120       Okey Regal, PA-C 06/25/17 2128    Duffy Bruce, MD 06/26/17 1208

## 2017-06-25 NOTE — ED Provider Notes (Signed)
Patient placed in Quick Look pathway, seen and evaluated   Chief Complaint: right abdominal and flank pain  HPI:   Pt states she has sharp pain in right upper abdomen radiating to right lower abdomen and right flank. States started 3 days ago. Denies similar pain before. No nausea or vomiting. States had some constipation and took prune juice which helped with bowel movement but did not help with pain. No fever or chills. Reports urinary frequency, no dysuria. Denies abd surgeries  ROS: abd pain, flank pain, urinary frequency. No nausea, vomiting, changes in bowels.   Physical Exam:   Gen: No distress  Neuro: Awake and Alert  Skin: Warm    Focused Exam: some skin erythema and induration to the lower midline panus, no tenderness. RUQ and RLQ tenderness, right CVA tenderness, although exam is limited by pt's body habitus and sitting position in triage. No rashes or skin discoloration over tender areas.    Initiation of care has begun. The patient has been counseled on the process, plan, and necessity for staying for the completion/evaluation, and the remainder of the medical screening examination  Pt with abd pain, RUQ, RLQ, Flank pain for 3 days. No prior surgeries. Differential includes abdominal spasms, cellulitis, colitis, cholecysts, appendicitis among some. Will check labs, urine, CT abd/pelvis.     Jeannett Senior, PA-C 06/25/17 1354    Pattricia Boss, MD 06/27/17 1242

## 2017-06-25 NOTE — Discharge Instructions (Addendum)
Please read attached information. If you experience any new or worsening signs or symptoms please return to the emergency room for evaluation. Please follow-up with your primary care provider or specialist as discussed. Please use medication prescribed only as directed and discontinue taking if you have any concerning signs or symptoms.   °

## 2017-06-25 NOTE — ED Triage Notes (Signed)
Pt reports right flank and lower abdominal pain x several days. Denies dysuria. Endorses urinary frequency.

## 2017-06-26 ENCOUNTER — Telehealth: Payer: Self-pay | Admitting: *Deleted

## 2017-06-26 ENCOUNTER — Encounter: Payer: Self-pay | Admitting: *Deleted

## 2017-06-26 DIAGNOSIS — G4733 Obstructive sleep apnea (adult) (pediatric): Secondary | ICD-10-CM

## 2017-06-26 NOTE — Telephone Encounter (Signed)
Patient was scheduled for a split night study twice in September 25th and 28th 2018. On October 20 and December 22 2016 and she has cancelled each time. I called patient and lmtcb as to whether she will reschedule her repeat study or not. Thanks

## 2017-06-26 NOTE — Telephone Encounter (Signed)
Certified letter sent today.

## 2017-06-26 NOTE — Telephone Encounter (Signed)
-----   Message from Sueanne Margarita, MD sent at 06/19/2017  8:37 AM EDT ----- Regarding: RE: NEW CPAP She was supposed to have a repeat sleep study with sleep aid back in July 2018 and it does not appear that this was ever done please find out if this is right ----- Message ----- From: Freada Bergeron, CMA Sent: 06/18/2017  10:39 AM To: Sueanne Margarita, MD Subject: NEW CPAP                                       Per [facility] ( [place]) patient is eligible for a new CPAP. Please write orders. Thanks

## 2017-07-06 ENCOUNTER — Encounter (HOSPITAL_COMMUNITY): Payer: Self-pay

## 2017-07-06 ENCOUNTER — Emergency Department (HOSPITAL_COMMUNITY)
Admission: EM | Admit: 2017-07-06 | Discharge: 2017-07-07 | Disposition: A | Discharge status: Home / Self Care | Payer: Medicaid Other | Attending: Emergency Medicine | Admitting: Emergency Medicine

## 2017-07-06 ENCOUNTER — Other Ambulatory Visit: Payer: Self-pay

## 2017-07-06 DIAGNOSIS — J449 Chronic obstructive pulmonary disease, unspecified: Secondary | ICD-10-CM | POA: Diagnosis not present

## 2017-07-06 DIAGNOSIS — M5431 Sciatica, right side: Secondary | ICD-10-CM | POA: Diagnosis not present

## 2017-07-06 DIAGNOSIS — R1011 Right upper quadrant pain: Secondary | ICD-10-CM | POA: Diagnosis not present

## 2017-07-06 DIAGNOSIS — Z79899 Other long term (current) drug therapy: Secondary | ICD-10-CM | POA: Insufficient documentation

## 2017-07-06 DIAGNOSIS — M545 Low back pain: Secondary | ICD-10-CM | POA: Diagnosis present

## 2017-07-06 DIAGNOSIS — I110 Hypertensive heart disease with heart failure: Secondary | ICD-10-CM | POA: Diagnosis not present

## 2017-07-06 DIAGNOSIS — E119 Type 2 diabetes mellitus without complications: Secondary | ICD-10-CM | POA: Insufficient documentation

## 2017-07-06 DIAGNOSIS — I5032 Chronic diastolic (congestive) heart failure: Secondary | ICD-10-CM | POA: Diagnosis not present

## 2017-07-06 DIAGNOSIS — Z87891 Personal history of nicotine dependence: Secondary | ICD-10-CM | POA: Insufficient documentation

## 2017-07-06 LAB — CBC
HEMATOCRIT: 48.2 % — AB (ref 36.0–46.0)
HEMOGLOBIN: 15.1 g/dL — AB (ref 12.0–15.0)
MCH: 27.1 pg (ref 26.0–34.0)
MCHC: 31.3 g/dL (ref 30.0–36.0)
MCV: 86.4 fL (ref 78.0–100.0)
Platelets: 234 10*3/uL (ref 150–400)
RBC: 5.58 MIL/uL — AB (ref 3.87–5.11)
RDW: 14.6 % (ref 11.5–15.5)
WBC: 8.7 10*3/uL (ref 4.0–10.5)

## 2017-07-06 LAB — COMPREHENSIVE METABOLIC PANEL
ALK PHOS: 84 U/L (ref 38–126)
ALT: 11 U/L — AB (ref 14–54)
AST: 20 U/L (ref 15–41)
Albumin: 4.1 g/dL (ref 3.5–5.0)
Anion gap: 13 (ref 5–15)
BUN: 13 mg/dL (ref 6–20)
CALCIUM: 9.2 mg/dL (ref 8.9–10.3)
CHLORIDE: 103 mmol/L (ref 101–111)
CO2: 27 mmol/L (ref 22–32)
CREATININE: 0.73 mg/dL (ref 0.44–1.00)
GFR calc non Af Amer: >60 mL/min (ref >60)
GLUCOSE: 91 mg/dL (ref 65–99)
Potassium: 3.6 mmol/L (ref 3.5–5.1)
SODIUM: 143 mmol/L (ref 135–145)
Total Bilirubin: 0.8 mg/dL (ref 0.3–1.2)
Total Protein: 8.3 g/dL — ABNORMAL HIGH (ref 6.5–8.1)

## 2017-07-06 LAB — URINALYSIS, ROUTINE W REFLEX MICROSCOPIC
BILIRUBIN URINE: NEGATIVE
Glucose, UA: NEGATIVE mg/dL
HGB URINE DIPSTICK: NEGATIVE
Ketones, ur: NEGATIVE mg/dL
LEUKOCYTES UA: NEGATIVE
NITRITE: NEGATIVE
PROTEIN: 30 mg/dL — AB
Specific Gravity, Urine: 1.013 (ref 1.005–1.030)
pH: 6 (ref 5.0–8.0)

## 2017-07-06 LAB — LIPASE, BLOOD: Lipase: 22 U/L (ref 11–51)

## 2017-07-06 LAB — I-STAT BETA HCG BLOOD, ED (MC, WL, AP ONLY)

## 2017-07-06 MED ORDER — OXYCODONE-ACETAMINOPHEN 5-325 MG PO TABS
1.0000 | ORAL_TABLET | ORAL | Status: AC | PRN
  Administered 2017-07-06 – 2017-07-07 (×2): 1 via ORAL
  Filled 2017-07-06 (×2): qty 1

## 2017-07-06 NOTE — ED Triage Notes (Addendum)
PT BIB EMS C/O CONTINUED RIGHT FLANK PAIN X3 WEEKS. PER EMSPT WAS SEEN AT [facility] FOR SAME 2 WEEKS AGO. PT STS SHE HAS RUQ/RLQ PAIN WITH NAUSEA W/O VOMITING. DENIES URINARY SYMPTOMS.

## 2017-07-06 NOTE — ED Notes (Signed)
BLOOD DRAW UNSUCCESSFUL X2.

## 2017-07-07 ENCOUNTER — Emergency Department (HOSPITAL_COMMUNITY): Payer: Medicaid Other

## 2017-07-07 ENCOUNTER — Other Ambulatory Visit: Payer: Self-pay

## 2017-07-07 MED ORDER — IOPAMIDOL (ISOVUE-300) INJECTION 61%
INTRAVENOUS | Status: AC
  Filled 2017-07-07: qty 100

## 2017-07-07 MED ORDER — IOPAMIDOL (ISOVUE-300) INJECTION 61%
100.0000 mL | Freq: Once | INTRAVENOUS | Status: AC | PRN
  Administered 2017-07-07: 100 mL via INTRAVENOUS

## 2017-07-07 MED ORDER — PREDNISONE 20 MG PO TABS: 40.0000 mg | ORAL_TABLET | Freq: Every day | ORAL | 0 refills | Status: DC

## 2017-07-07 NOTE — ED Provider Notes (Signed)
[facility] DEPT Provider Note   CSN: [id number] Arrival date & time: 07/06/17  1911     History   Chief Complaint Chief Complaint  Patient presents with  . Abdominal Pain    RUQ/RLQ    HPI Jenna Chandler is a 52 y.o. female.  Patient presents to the emergency department with a chief complaint of abdominal pain.  She states that the pain radiates from her low back on the right side and wraps around her right lower abdomen.  She states that the pain also radiates into her buttock.  She denies any fever, chills, nausea, vomiting.  She states that she is very concerned because her sister died of liver cancer.  She was seen recently at [facility] and had a negative CT.  She is convinced that something is wrong, and would like to be evaluated again.  Her symptoms are worsened with palpation and movement.  The history is provided by the patient. No language interpreter was used.    Past Medical History:  Diagnosis Date  . Asthma   . Chronic diastolic CHF (congestive heart failure) ([place])   . COPD (chronic obstructive pulmonary disease) ([name])    +/- asthma   . Diabetes mellitus without complication ([place])   . Enlarged heart   . Fibromyalgia   . Hypertension   . Manic, depressive ([place])   . Morbid obesity ([place])   . OSA (obstructive sleep apnea)     Patient Active Problem List   Diagnosis Date Noted  . SOB (shortness of breath) 05/05/2014  . Chest pain 05/05/2014  . Chronic respiratory failure ([place]) 04/26/2012  . OSA (obstructive sleep apnea) 06/02/2011  . Lethargy 06/02/2011  . Diastolic CHF, chronic ([place]) 06/01/2011  . 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 HTN (hypertension) 05/11/2011  . Morbid obesity ([place]) 05/11/2011  . Normocytic anemia 05/11/2011  . Manic, depressive ([place])     Past Surgical History:  Procedure Laterality Date  . NO PAST SURGERIES       OB History    Gravida  1   Para  1   Term  1   Preterm      AB      Living  1     SAB      TAB      Ectopic      Multiple      Live Births  1            Home Medications    Prior to Admission medications   Medication Sig Start Date End Date Taking? Authorizing Provider  albuterol (PROVENTIL HFA;VENTOLIN HFA) 108 (90 BASE) MCG/ACT inhaler Inhale 2 puffs into the lungs every 6 (six) hours as needed. wheezing 12/29/13  Yes Elsie Stain, MD  albuterol (PROVENTIL) (2.5 MG/3ML) 0.083% nebulizer solution Take 3 mLs (2.5 mg total) by nebulization every 6 (six) hours as needed. 11/17/14  Yes Dorie Rank, MD  amLODipine (NORVASC) 10 MG tablet Take 10 mg by mouth 2 (two) times daily. 12/23/14  Yes [provider]  cloNIDine (CATAPRES) 0.2 MG tablet Take 0.2 mg by mouth 2 (two) times daily. 05/25/17  Yes [provider]  cyclobenzaprine (FLEXERIL) 10 MG tablet Take 1 tablet (10 mg total) by mouth 2 (two) times daily as needed for muscle spasms. 08/26/13  Yes Kirichenko, Tatyana, PA-C  furosemide (LASIX) 40 MG tablet Take 80 mg by mouth 2 (two) times daily. 05/18/11  Yes Black, Lezlie Octave,  NP  gabapentin (NEURONTIN) 300 MG capsule Take 300 mg by mouth 2 (two) times daily. 09/12/16  Yes [provider]  ibuprofen (ADVIL,MOTRIN) 800 MG tablet Take 800 mg by mouth every 6 (six) hours as needed. AS NEEDED FOR MODERATE PAIN 12/22/14  Yes [provider]  losartan (COZAAR) 50 MG tablet Take 50 mg by mouth daily. 09/12/16  Yes [provider]  traZODone (DESYREL) 100 MG tablet Take 100 mg by mouth at bedtime as needed for sleep.   Yes [provider]  doxycycline (VIBRAMYCIN) 100 MG capsule Take 1 capsule (100 mg total) by mouth 2 (two) times daily. Patient not taking: Reported on 07/06/2017 06/25/17   Hedges, Dellis Filbert, PA-C  traMADol (ULTRAM) 50 MG tablet Take 1 tablet (50 mg total) by mouth every 6 (six) hours as needed. Patient not taking: Reported on 07/06/2017 06/25/17   Okey Regal, PA-C    Family  History Family History  Problem Relation Age of Onset  . Cancer Mother     Social History Social History   Tobacco Use  . Smoking status: Former Smoker    Packs/day: 2.00    Years: 15.00    Pack years: 30.00    Types: Cigarettes    Last attempt to quit: 05/24/2002    Years since quitting: 15.1  . Smokeless tobacco: Never Used  Substance Use Topics  . Alcohol use: No  . Drug use: No     Allergies   Lisinopril and Levaquin [levofloxacin]   Review of Systems Review of Systems  All other systems reviewed and are negative.    Physical Exam Updated Vital Signs BP (!) 162/102 (BP Location: Right Wrist)   Pulse 76   Temp 98.3 F (36.8 C) (Oral)   Resp 20   Ht 5\' 4"  (1.626 m)   Wt (!) 206.4 kg (455 lb)   SpO2 97%   BMI 78.10 kg/m   Physical Exam  Constitutional: She is oriented to person, place, and time. She appears well-developed and well-nourished.  HENT:  Head: Normocephalic and atraumatic.  Eyes: Pupils are equal, round, and reactive to light. Conjunctivae and EOM are normal.  Neck: Normal range of motion. Neck supple.  Cardiovascular: Normal rate and regular rhythm. Exam reveals no gallop and no friction rub.  No murmur heard. Pulmonary/Chest: Effort normal and breath sounds normal. No respiratory distress. She has no wheezes. She has no rales. She exhibits no tenderness.  Abdominal: Soft. Bowel sounds are normal. She exhibits no distension and no mass. There is tenderness in the right lower quadrant. There is no rebound and no guarding.  Musculoskeletal: Normal range of motion. She exhibits no edema or tenderness.  Neurological: She is alert and oriented to person, place, and time.  Skin: Skin is warm and dry.  Psychiatric: She has a normal mood and affect. Her behavior is normal. Judgment and thought content normal.  Nursing note and vitals reviewed.    ED Treatments / Results  Labs (all labs ordered are listed, but only abnormal results are  displayed) Labs Reviewed  COMPREHENSIVE METABOLIC PANEL - Abnormal; Notable for the following components:      Result Value   Total Protein 8.3 (*)    ALT 11 (*)    All other components within normal limits  CBC - Abnormal; Notable for the following components:   RBC 5.58 (*)    Hemoglobin 15.1 (*)    HCT 48.2 (*)    All other components within normal limits  URINALYSIS,  ROUTINE W REFLEX MICROSCOPIC - Abnormal; Notable for the following components:   Protein, ur 30 (*)    Bacteria, UA RARE (*)    All other components within normal limits  LIPASE, BLOOD  I-STAT BETA HCG BLOOD, ED ([facility], [facility], [facility] ONLY)    EKG None  Radiology No results found.  Procedures Procedures (including critical care time)  Medications Ordered in ED Medications  oxyCODONE-acetaminophen (PERCOCET/ROXICET) 5-325 MG per tablet 1 tablet (1 tablet Oral Given 07/06/17 2015)     Initial Impression / Assessment and Plan / ED Course  I have reviewed the triage vital signs and the nursing notes.  Pertinent labs & imaging results that were available during my care of the patient were reviewed by me and considered in my medical decision making (see chart for details).     Presents with right flank, low back, and right buttock pain.  She is very concerned about her liver and appendix.  She states that her sister died of liver cancer.  She states that she was seen about 3 weeks ago, but they could not identify her appendix on CT scan.  She states that she is very concerned that she has appendicitis.  She denies any fever, or vomiting.  I discussed with the patient that it is highly unlikely that she would have appendicitis that would have lasted this long.  Nevertheless, patient insists on getting repeat CAT scan.  I believe symptoms to be likely lumbar radiculopathy versus sciatica in etiology.  This pain does radiate down her leg and into her buttock.  CT scan is reassuring.  Normal appendix.  Unremarkable appearance  of the liver.  Her liver enzymes are not elevated.  Will treat for lumbar radiculopathy/sciatica with prednisone.  She is nondiabetic.  Recommend orthopedic/spine follow-up.  Patient understands and agrees with the plan.  She is stable and ready for discharge.  Final Clinical Impressions(s) / ED Diagnoses   Final diagnoses:  Sciatica of right side    ED Discharge Orders        Ordered    predniSONE (DELTASONE) 20 MG tablet  Daily     07/07/17 0319       Montine Circle, PA-C 07/07/17 [place], April, MD 07/07/17 [phone]

## 2017-07-07 NOTE — ED Notes (Signed)
Patient transported to CT 

## 2017-08-02 ENCOUNTER — Telehealth: Payer: Self-pay | Admitting: *Deleted

## 2017-08-02 NOTE — Addendum Note (Signed)
Addended by: Freada Bergeron on: 08/02/2017 11:15 AM   Modules accepted: Orders

## 2017-08-02 NOTE — Telephone Encounter (Signed)
Reached out to patient to ask about her intentions to reschedule her sleep study and was informed that she wanted to reschedule her sleep study. Patient states she has been sick on and off which is the reason she has cancelled all of her previous sleep studies. Per Dr Radford Pax her sleep study has been reordered and sent to the sleep pool.

## 2017-08-03 ENCOUNTER — Telehealth: Payer: Self-pay | Admitting: *Deleted

## 2017-08-03 NOTE — Telephone Encounter (Signed)
Staff message sent to [place]. Patient has Medicaid and does not need a pre cert. Ok to schedule.

## 2017-08-03 NOTE — Telephone Encounter (Signed)
-----   Message from Freada Bergeron, [place] sent at 08/02/2017 11:01 AM EDT ----- Regarding: pre cert Split night

## 2017-08-06 NOTE — Telephone Encounter (Signed)
  Lauralee Evener, CMA  Freada Bergeron, CMA        Patient has Medicaid she doesn't need pre cert.     ----- Message -----  From: Freada Bergeron, CMA  Sent: 08/02/2017 11:01 AM  To: [facility] Sleep Studies  Subject: pre cert                     Split night

## 2017-08-06 NOTE — Telephone Encounter (Addendum)
Patient is scheduled for lab study on 09/04/17. Patient understands her sleep study will be done at [facility] sleep lab. Patient understands she will receive a sleep packet in a week or so. Patient understands to call if she does not receive the sleep packet in a timely manner. Patient agrees with treatment and thanked me for call Left detailed message on voicemail with date and time of titration and informed patient to call back to confirm or reschedule.

## 2017-08-14 ENCOUNTER — Ambulatory Visit: Payer: Medicaid Other | Admitting: Cardiology

## 2017-08-29 ENCOUNTER — Encounter (HOSPITAL_BASED_OUTPATIENT_CLINIC_OR_DEPARTMENT_OTHER): Payer: Medicaid Other

## 2017-09-04 ENCOUNTER — Ambulatory Visit (HOSPITAL_BASED_OUTPATIENT_CLINIC_OR_DEPARTMENT_OTHER): Payer: Medicaid Other | Attending: Cardiology | Admitting: Cardiology

## 2017-09-04 VITALS — Ht 64.0 in | Wt >= 6400 oz

## 2017-09-04 DIAGNOSIS — G4733 Obstructive sleep apnea (adult) (pediatric): Secondary | ICD-10-CM | POA: Diagnosis not present

## 2017-09-04 DIAGNOSIS — R0902 Hypoxemia: Secondary | ICD-10-CM | POA: Diagnosis not present

## 2017-09-06 ENCOUNTER — Telehealth: Payer: Self-pay | Admitting: *Deleted

## 2017-09-06 NOTE — Telephone Encounter (Signed)
-----   Message from Sueanne Margarita, MD sent at 09/06/2017  9:17 AM EDT ----- Please let patient know that they have significant sleep apnea and had successful PAP titration and will be set up with PAP unit.  Please let DME know that order is in EPIC.  Please set patient up for OV in 10 weeks

## 2017-09-06 NOTE — Procedures (Signed)
Patient Name: Jenna Chandler, Jenna Chandler Date: 09/04/2017   Gender: Female  D.O.B: 1965/07/27  Age (years): 8  Referring Provider: Fransico Him MD, ABSM  Height (inches): 64  Interpreting Physician: Fransico Him MD, ABSM  Weight (lbs): 431  RPSGT: Jorge Ny  BMI: 74  MRN: [id number]  Neck Size: 18.00   CLINICAL INFORMATION  The patient is referred for a split night study with BPAP. Most recent polysomnogram dated 08/18/2016 revealed an AHI of 0.0/h and RDI of 6.4/h.   MEDICATIONS  Medications self-administered by patient taken the night of the study : N/A  SLEEP STUDY TECHNIQUE  As per the AASM Manual for the Scoring of Sleep and Associated Events v2.3 (April 2016) with a hypopnea requiring 4% desaturations. The channels recorded and monitored were frontal, central and occipital EEG, electrooculogram (EOG), submentalis EMG (chin), nasal and oral airflow, thoracic and abdominal wall motion, anterior tibialis EMG, snore microphone, electrocardiogram, and pulse oximetry. Bi-level positive airway pressure (BiPAP) was initiated when the patient met split night criteria and was titrated according to treat sleep-disordered breathing.  RESPIRATORY PARAMETERS  Diagnostic Total AHI (/hr): 93.8 RDI (/hr): 101.2 OA Index (/hr): 0.5 CA Index (/hr): 0.0  REM AHI (/hr): N/A NREM AHI (/hr): 93.8 Supine AHI (/hr): 69.2 Non-supine AHI (/hr): 112.13  Min O2 Sat (%): 77.0 Mean O2 (%): 88.5 Time below 88% (min): 64.7      Titration Optimal IPAP Pressure (cm): 19 Optimal EPAP Pressure (cm): 15 AHI at Optimal Pressure (/hr): 3.8 Min O2 at Optimal Pressure (%): 82.0  Sleep % at Optimal (%): 100 Supine % at Optimal (%): 100           SLEEP ARCHITECTURE  The study was initiated at 10:13:07 PM and terminated at 5:57:49 AM. The total recorded time was 464.7 minutes. EEG confirmed total sleep time was 344 minutes yielding a sleep efficiency of 74.0%%. Sleep onset after lights out was 45.9 minutes  with a REM latency of 192.0 minutes. The patient spent 4.8%% of the night in stage N1 sleep, 77.2%% in stage N2 sleep, 0.0%% in stage N3 and 18% in REM. Wake after sleep onset (WASO) was 74.8 minutes. The Arousal Index was 30.5/hour.  LEG MOVEMENT DATA  The total Periodic Limb Movements of Sleep (PLMS) were 0. The PLMS index was 0.0 .  CARDIAC DATA  The 2 lead EKG demonstrated sinus rhythm. The mean heart rate was 100.0 beats per minute. Other EKG findings include: PACs.   IMPRESSIONS  Severe obstructive sleep apnea occurred during the diagnostic portion of the study (AHI = 93.8 /hour). An optimal PAP pressure was selected for this patient ( 19 / cm of water)  No significant central sleep apnea occurred during the diagnostic portion of the study (CAI = 0.0/hour).  Severe oxygen desaturation was noted during the diagnostic portion of the study (Min O2 = 77.0%).  The patient snored with moderate snoring volume during the diagnostic portion of the study.  EKG findings include PACs.   Clinically significant periodic limb movements of sleep did not occur during the study.  DIAGNOSIS  Obstructive Sleep Apnea (327.23 [G47.33 ICD-10])  Nocturnal Hypoxemia  RECOMMENDATIONS  Trial of BiPAP therapy on auto with IPAP max 20cm H2O, EPAP min 6cm H2O and PS of 4cm H2O with a Medium size Resmed Full Face Mask AirFit F20 mask and heated humidification. Avoid alcohol, sedatives and other CNS depressants that may worsen sleep apnea and disrupt normal sleep architecture.  Sleep hygiene should be reviewed  to assess factors that may improve sleep quality.  Weight management and regular exercise should be initiated or continued.  Return to Sleep Center for re-evaluation.  [Electronically signed] 09/06/2017 09:14 AM Fransico Him MD, ABSM  Diplomate, American Board of Sleep Medicine

## 2017-09-06 NOTE — Telephone Encounter (Signed)
Informed patient of sleep titration results and patient understanding was verbalized. Patient understands her sleep study showed they have significant sleep apnea and had successful PAP titration and will be set up with PAP unit.  lmtcb

## 2017-09-07 NOTE — Telephone Encounter (Signed)
Called results lmtcb. 

## 2017-09-07 NOTE — Telephone Encounter (Signed)
Informed patient of sleep study results and patient understanding was verbalized. Patient understands her sleep study showed they have significant sleep apnea and had successful PAP titration and will be set up with PAP unit.  Upon patient request DME selection is [facility] Patient understands she will be contacted by [facility] to set up her cpap. Patient understands to call if [facility] does not contact her with new setup in a timely manner. Patient understands they will be called once confirmation has been received from [facility] that they have received their new machine to schedule 10 week follow up appointment.  [facility] notified of new cpap order  Please add to airview Patient was grateful for the call and thanked me.

## 2017-09-10 ENCOUNTER — Other Ambulatory Visit: Payer: Self-pay

## 2017-09-10 MED ORDER — LOSARTAN POTASSIUM 50 MG PO TABS: 50.0000 mg | ORAL_TABLET | Freq: Every day | ORAL | 0 refills | Status: DC

## 2017-09-10 MED ORDER — CLONIDINE HCL 0.2 MG PO TABS: 0.2000 mg | ORAL_TABLET | Freq: Two times a day (BID) | ORAL | 0 refills | Status: AC

## 2017-09-10 MED ORDER — AMLODIPINE BESYLATE 10 MG PO TABS: 10.0000 mg | ORAL_TABLET | Freq: Two times a day (BID) | ORAL | 0 refills | Status: DC

## 2017-10-09 NOTE — Telephone Encounter (Signed)
Patient called in to ask about the status of her Bipap machine. I reached out to [facility] and spoke to Taylor who informed me that her medicare was still pending. Called patient back to let her know her medicare was still pending and that it takes 14 days for medicare to make a decision and per Ivin Booty at [facility] still has 8 days to use if they choose. Contact information was given to the [facility] for [facility] Ivin Booty) at [phone]. Pt is aware and agreeable to treatment.

## 2017-10-11 NOTE — Telephone Encounter (Signed)
Ivin Booty at [facility] called to say the patient has been denied by medicaid because her office visit is past a year old  Ivin Booty states the patient has to start over with an office visit to discuss her cpap issues. Patient has been notified and was reminded to keep her upcoming 10/7//19 office visit with Dr Radford Pax.

## 2017-10-15 ENCOUNTER — Telehealth: Payer: Self-pay | Admitting: Pulmonary Disease

## 2017-10-15 MED ORDER — AZITHROMYCIN 250 MG PO TABS: ORAL_TABLET | ORAL | 0 refills | Status: DC

## 2017-10-15 NOTE — Telephone Encounter (Signed)
Pt aware of recs.  rx sent to preferred pharmacy.  Nothing further needed.  

## 2017-10-15 NOTE — Telephone Encounter (Signed)
Spoke with pt, she states she has an infection in her lungs because she has yellowish brown mucus. She is requesting an antibiotic for her symptoms. She has tried nasal decongestant but did not relieve her symptoms. She feels "yucky" and if she doesn't receive an ABX then she will get sicker. Denies fever or chest tightness. Dr. Vaughan Browner please advise.   [facility]  Current Outpatient Medications on File Prior to Visit  Medication Sig Dispense Refill  . albuterol (PROVENTIL HFA;VENTOLIN HFA) 108 (90 BASE) MCG/ACT inhaler Inhale 2 puffs into the lungs every 6 (six) hours as needed. wheezing 18 g 4  . albuterol (PROVENTIL) (2.5 MG/3ML) 0.083% nebulizer solution Take 3 mLs (2.5 mg total) by nebulization every 6 (six) hours as needed. 75 mL 6  . amLODipine (NORVASC) 10 MG tablet Take 1 tablet (10 mg total) by mouth 2 (two) times daily. 180 tablet 0  . cloNIDine (CATAPRES) 0.2 MG tablet Take 1 tablet (0.2 mg total) by mouth 2 (two) times daily. 180 tablet 0  . cyclobenzaprine (FLEXERIL) 10 MG tablet Take 1 tablet (10 mg total) by mouth 2 (two) times daily as needed for muscle spasms. 20 tablet 0  . doxycycline (VIBRAMYCIN) 100 MG capsule Take 1 capsule (100 mg total) by mouth 2 (two) times daily. (Patient not taking: Reported on 07/06/2017) 20 capsule 0  . furosemide (LASIX) 40 MG tablet Take 80 mg by mouth 2 (two) times daily.    Marland Kitchen gabapentin (NEURONTIN) 300 MG capsule Take 300 mg by mouth 2 (two) times daily.  0  . ibuprofen (ADVIL,MOTRIN) 800 MG tablet Take 800 mg by mouth every 6 (six) hours as needed. AS NEEDED FOR MODERATE PAIN  0  . losartan (COZAAR) 50 MG tablet Take 1 tablet (50 mg total) by mouth daily. 90 tablet 0  . predniSONE (DELTASONE) 20 MG tablet Take 2 tablets (40 mg total) by mouth daily. 10 tablet 0  . traMADol (ULTRAM) 50 MG tablet Take 1 tablet (50 mg total) by mouth every 6 (six) hours as needed. (Patient not taking: Reported on 07/06/2017) 10 tablet 0  . traZODone (DESYREL)  100 MG tablet Take 100 mg by mouth at bedtime as needed for sleep.     No current facility-administered medications on file prior to visit.    Allergies  Allergen Reactions  . Lisinopril Cough  . Levaquin [Levofloxacin]     Nauseated

## 2017-10-15 NOTE — Telephone Encounter (Signed)
Call in Z pack 

## 2017-10-29 ENCOUNTER — Other Ambulatory Visit: Payer: Self-pay | Admitting: Cardiology

## 2017-11-12 ENCOUNTER — Encounter: Payer: Self-pay | Admitting: Cardiology

## 2017-11-12 ENCOUNTER — Ambulatory Visit (INDEPENDENT_AMBULATORY_CARE_PROVIDER_SITE_OTHER): Payer: Medicaid Other | Admitting: Cardiology

## 2017-11-12 VITALS — BP 142/94 | HR 87 | Ht 64.0 in | Wt >= 6400 oz

## 2017-11-12 DIAGNOSIS — G4733 Obstructive sleep apnea (adult) (pediatric): Secondary | ICD-10-CM | POA: Diagnosis not present

## 2017-11-12 DIAGNOSIS — R002 Palpitations: Secondary | ICD-10-CM

## 2017-11-12 DIAGNOSIS — I10 Essential (primary) hypertension: Secondary | ICD-10-CM

## 2017-11-12 DIAGNOSIS — L03119 Cellulitis of unspecified part of limb: Secondary | ICD-10-CM

## 2017-11-12 DIAGNOSIS — I5032 Chronic diastolic (congestive) heart failure: Secondary | ICD-10-CM

## 2017-11-12 DIAGNOSIS — R0602 Shortness of breath: Secondary | ICD-10-CM

## 2017-11-12 MED ORDER — LOSARTAN POTASSIUM 50 MG PO TABS: 50.0000 mg | ORAL_TABLET | Freq: Every day | ORAL | 3 refills | Status: DC

## 2017-11-12 NOTE — Progress Notes (Addendum)
Cardiology Office Note:    Date:  11/12/2017   ID:  Jenna Chandler, DOB Apr 06, 1965, MRN [id number]  PCP:  Jenna Ebbs, MD  Cardiologist:  No primary care provider on file.    Referring MD: Jenna Ebbs, MD   Chief Complaint  Patient presents with  . Congestive Heart Failure  . Hypertension  . Sleep Apnea    History of Present Illness:    Jenna Chandler is a 52 y.o. female with a hx of chronic diastolic CHF, HTN, morbid obesity and OSA on BiPAP who presents for followup. Her EF was normal on echo 2013.  She has chronic DOE with walking felt related to morbid obesity. She uses O2 PRN and is followed by Pulmonary.    She is here today for followup and is doing fairly well.  She denies any chest pain or pressure, PND, orthopnea,  dizziness, palpitations or syncope.  She has chronic lower extremity edema related to morbid obesity but is actually been pretty good lately.  She uses her Lasix daily.  She has had some problems with increased swelling but more up under her fat pads in her legs and abdomen and is worried she has cellulitis.  She is compliant with her meds and is tolerating meds with no SE. She has chronic shortness of breath related to morbid obesity.  She has been trying to exercise more and walks back and forth in her house.  She is actually lost 22 pounds since March.    I saw her back in May 2018 she was complained that she did not feel like she was getting enough pressure for her CPAP machine.  She underwent a split-night sleep study with sleep aid on 09/04/2017 which revealed severe obstructive sleep apnea with an AHI of 93.8/h and no significant central sleep apnea.  She had oxygen desaturations as low as 77%.  She underwent BiPAP titration but still could not be adequately titrated so auto BiPAP was ordered unfortunately her device was denied by insurance because she had waited too long to get the sleep study done that she was more than a year out from recent office  visit.  She is now here for office visit related to sleep apnea so that she can get her new BiPAP machine.    Past Medical History:  Diagnosis Date  . Asthma   . Chronic diastolic CHF (congestive heart failure) ([facility])   . COPD (chronic obstructive pulmonary disease) ([name])    +/- asthma   . Diabetes mellitus without complication ([place])   . Enlarged heart   . Fibromyalgia   . Hypertension   . Manic, depressive ([place])   . Morbid obesity ([place])   . OSA (obstructive sleep apnea)     Past Surgical History:  Procedure Laterality Date  . NO PAST SURGERIES      Current Medications: Current Meds  Medication Sig  . albuterol (PROVENTIL HFA;VENTOLIN HFA) 108 (90 BASE) MCG/ACT inhaler Inhale 2 puffs into the lungs every 6 (six) hours as needed. wheezing  . albuterol (PROVENTIL) (2.5 MG/3ML) 0.083% nebulizer solution Take 3 mLs (2.5 mg total) by nebulization every 6 (six) hours as needed.  Marland Kitchen amLODipine (NORVASC) 10 MG tablet Take 1 tablet (10 mg total) by mouth 2 (two) times daily.  . cloNIDine (CATAPRES) 0.2 MG tablet Take 1 tablet (0.2 mg total) by mouth 2 (two) times daily.  . cyclobenzaprine (FLEXERIL) 10 MG tablet Take 1 tablet (10 mg total) by mouth 2 (two) times daily  as needed for muscle spasms.  . furosemide (LASIX) 40 MG tablet Take 80 mg by mouth 2 (two) times daily.  Marland Kitchen gabapentin (NEURONTIN) 300 MG capsule Take 300 mg by mouth 2 (two) times daily.  Marland Kitchen ibuprofen (ADVIL,MOTRIN) 800 MG tablet Take 800 mg by mouth every 6 (six) hours as needed. AS NEEDED FOR MODERATE PAIN  . traZODone (DESYREL) 100 MG tablet Take 100 mg by mouth at bedtime as needed for sleep.     Allergies:   Lisinopril and Levaquin [levofloxacin]   Social History   Socioeconomic History  . Marital status: Single    Spouse name: Not on file  . Number of children: Not on file  . Years of education: Not on file  . Highest education level: Not on file  Occupational History  . Not on file  Social Needs  .  Financial resource strain: Not on file  . Food insecurity:    Worry: Not on file    Inability: Not on file  . Transportation needs:    Medical: Not on file    Non-medical: Not on file  Tobacco Use  . Smoking status: Former Smoker    Packs/day: 2.00    Years: 15.00    Pack years: 30.00    Types: Cigarettes    Last attempt to quit: 05/24/2002    Years since quitting: 15.4  . Smokeless tobacco: Never Used  Substance and Sexual Activity  . Alcohol use: No  . Drug use: No  . Sexual activity: Not Currently    Birth control/protection: Abstinence  Lifestyle  . Physical activity:    Days per week: Not on file    Minutes per session: Not on file  . Stress: Not on file  Relationships  . Social connections:    Talks on phone: Not on file    Gets together: Not on file    Attends religious service: Not on file    Active member of club or organization: Not on file    Attends meetings of clubs or organizations: Not on file    Relationship status: Not on file  Other Topics Concern  . Not on file  Social History Narrative  . Not on file     Family History: The patient's family history includes Cancer in her mother.  ROS:   Please see the history of present illness.    ROS  All other systems reviewed and negative.   EKGs/Labs/Other Studies Reviewed:    The following studies were reviewed today: PAP download  EKG:  EKG is not ordered today.  Recent Labs: 07/06/2017: ALT 11; BUN 13; Creatinine, Ser 0.73; Hemoglobin 15.1; Platelets 234; Potassium 3.6; Sodium 143   Recent Lipid Panel    Component Value Date/Time   CHOL 152 03/04/2008 2146   TRIG 103 03/04/2008 2146   HDL 56 03/04/2008 2146   CHOLHDL 2.7 Ratio 03/04/2008 2146   VLDL 21 03/04/2008 2146   LDLCALC 75 03/04/2008 2146    Physical Exam:    VS:  BP (!) 142/94   Pulse 87   Ht 5\' 4"  (1.626 m)   Wt (!) 432 lb 3.2 oz (196 kg)   SpO2 94%   BMI 74.19 kg/m     Wt Readings from Last 3 Encounters:  11/12/17 (!)  432 lb 3.2 oz (196 kg)  09/04/17 (!) 455 lb (206.4 kg)  07/06/17 (!) 455 lb (206.4 kg)     GEN:  Well nourished, well developed in no acute distress HEENT:  Normal NECK: No JVD; No carotid bruits LYMPHATICS: No lymphadenopathy CARDIAC: RRR, no murmurs, rubs, gallops RESPIRATORY:  Clear to auscultation without rales, wheezing or rhonchi  ABDOMEN: Soft, non-tender, non-distended MUSCULOSKELETAL:  Trace LE edema; No deformity  SKIN: Warm and dry NEUROLOGIC:  Alert and oriented x 3 PSYCHIATRIC:  Normal affect   ASSESSMENT:    1. Diastolic CHF, chronic ([name])   2. Essential hypertension   3. OSA (obstructive sleep apnea)   4. Morbid obesity ([name])   5. Palpitations   6. Cellulitis of lower extremity, unspecified laterality   7. SOB (shortness of breath)    PLAN:    In order of problems listed above:  1.  Chronic diastolic CHF - she appears euvolemic on exam today and her weight is stable.  She is actually lost 22 pounds since March.  She will continue on Lasix 80 mg twice daily.    Since she is having shortness of breath as well as some lower extremity edema I recommended checking a BNP and a bmet today.  It is very difficult to determine her volume status because of her morbid obesity.  ProBNP a year ago was 75 0 proBNP is normal at this time is unlikely that she is having exacerbation of CHF and that her edema is most likely due to morbid obesity.  2.  HTN -BP is borderline controlled on exam today.  She will continue on clonidine 0.2 mg twice daily, amlodipine 10 mg twice daily.  She had run out of her losartan which was likely the issue with her elevated blood pressure.  I will refill fill her losartan 50 mill grams daily.  Creatinine was normal at 0.73 potassium 3.6 on 07/06/2017.  3.  OSA -last split-night sleep study showed severe obstructive sleep apnea with an AHI of 93.8/h and significant oxygen desaturations to 77%.  She was placed on BiPAP auto but unfortunately never got her  device because she had not seen me for an office visit in a year and it because it took her so long to get her titration done.  She is anxious to get on her device.  She has been using her old PAP which she has had to basically held together with tape.  I will order her an Airsense BiPAP device with heated humidity and mask of choice on auto setting with an IPAP max of 20 cm H2O and EPAP min of 6 cm H2O and pressure support of 4 cm H2O.  4.  Morbid obesity - I have encouraged her to get into a routine exercise program and cut back on carbs and portions.  I have encouraged her to try starting a walking program even if it is in her house walking 15 minutes at a time and each week at 1 5 more minutes.  She is already lost 22 pounds which she is very excited about.  5.  Palpitations - she is complaining of intermittent fluttering in her chest for a while now.  She is clearly at higher risk of developing atrial fibrillation given her morbid obesity as well as obstructive sleep apnea.  I recommended getting a 30-day event monitor to rule out PAF.  6.  Possible lower extremity cellulitis - do not see anything on the lower parts of her legs but she says she is very excoriated and a lot of pain under the fat pads in her legs and abdomen and is worried she has another infection with possible yeast.  Into her PCP tomorrow  for further evaluation.     Medication Adjustments/Labs and Tests Ordered: Current medicines are reviewed at length with the patient today.  Concerns regarding medicines are outlined above.  Orders Placed This Encounter  Procedures  . Basic metabolic panel  . Pro b natriuretic peptide (BNP)  . Bipap  . CARDIAC EVENT MONITOR   Meds ordered this encounter  Medications  . losartan (COZAAR) 50 MG tablet    Sig: Take 1 tablet (50 mg total) by mouth daily.    Dispense:  90 tablet    Refill:  3    Signed, Fransico Him, MD  11/12/2017 4:42 PM    [place]

## 2017-11-12 NOTE — Patient Instructions (Signed)
Medication Instructions:  Refilled Losartan   If you need a refill on your cardiac medications before your next appointment, please call your pharmacy.   Lab work: Today: BMET and ProBNP  If you have labs (blood work) drawn today and your tests are completely normal, you will receive your results only by: Marland Kitchen MyChart Message (if you have MyChart) OR . A paper copy in the mail If you have any lab test that is abnormal or we need to change your treatment, we will call you to review the results.  Testing/Procedures: Your physician has recommended that you wear an event monitor. Event monitors are medical devices that record the heart's electrical activity. Doctors most often [place] these monitors to diagnose arrhythmias. Arrhythmias are problems with the speed or rhythm of the heartbeat. The monitor is a small, portable device. You can wear one while you do your normal daily activities. This is usually used to diagnose what is causing palpitations/syncope (passing out).  Follow-Up: Your physician recommends that you schedule a follow-up appointment in: 10 weeks with Dr. Radford Pax.  See Primary Care Doctor ASAP

## 2017-11-22 ENCOUNTER — Other Ambulatory Visit: Payer: Medicaid Other

## 2017-11-28 ENCOUNTER — Encounter: Payer: Self-pay | Admitting: Cardiology

## 2017-11-28 NOTE — Telephone Encounter (Signed)
New Message:   Patient calling concerning some results and she has not heard from anyone. Pleas call

## 2017-11-28 NOTE — Telephone Encounter (Signed)
Entered in error

## 2017-11-29 ENCOUNTER — Other Ambulatory Visit: Payer: Medicaid Other

## 2017-11-29 ENCOUNTER — Ambulatory Visit: Payer: Medicaid Other

## 2017-11-30 LAB — BASIC METABOLIC PANEL
BUN / CREAT RATIO: 14 (ref 9–23)
BUN: 13 mg/dL (ref 6–24)
CALCIUM: 9.3 mg/dL (ref 8.7–10.2)
CO2: 28 mmol/L (ref 20–29)
Chloride: 97 mmol/L (ref 96–106)
Creatinine, Ser: 0.92 mg/dL (ref 0.57–1.00)
GFR calc non Af Amer: 71 mL/min/{1.73_m2} (ref >59)
GFR, EST AFRICAN AMERICAN: 82 mL/min/{1.73_m2} (ref >59)
GLUCOSE: 89 mg/dL (ref 65–99)
POTASSIUM: 3.4 mmol/L — AB (ref 3.5–5.2)
Sodium: 141 mmol/L (ref 134–144)

## 2017-11-30 LAB — PRO B NATRIURETIC PEPTIDE: NT-Pro BNP: 52 pg/mL (ref 0–249)

## 2017-12-04 ENCOUNTER — Telehealth: Payer: Self-pay

## 2017-12-04 DIAGNOSIS — E876 Hypokalemia: Secondary | ICD-10-CM

## 2017-12-04 MED ORDER — POTASSIUM CHLORIDE CRYS ER 20 MEQ PO TBCR: 20.0000 meq | EXTENDED_RELEASE_TABLET | Freq: Every day | ORAL | 3 refills | Status: DC

## 2017-12-04 NOTE — Telephone Encounter (Signed)
Left message to call back  

## 2017-12-04 NOTE — Telephone Encounter (Signed)
Spoke with the patient, she accepted her results and will start Kdur 20 meq, daily. She will call back tomorrow to schedule lab appointment for BMET.

## 2017-12-04 NOTE — Telephone Encounter (Signed)
Notes recorded by Sueanne Margarita, MD on 11/30/2017 at 9:48 AM EDT K mildy low - add Kdur 21meq daily and repeat bmet in 1 week

## 2017-12-07 ENCOUNTER — Telehealth: Payer: Self-pay | Admitting: *Deleted

## 2017-12-07 NOTE — Telephone Encounter (Signed)
Reached out to [facility] about patients machine and the authorization is still pending. Jenna Chandler will call the patient to update her on her status.

## 2017-12-07 NOTE — Telephone Encounter (Signed)
-----   Message from Earnestine Mealing sent at 12/07/2017  2:52 PM EDT ----- Regarding: CPAP machine Gae Bon, Please call pt regarding her CPAP machine.  She states that it is not working and she has had a sleep study.

## 2017-12-10 ENCOUNTER — Telehealth: Payer: Self-pay | Admitting: Cardiology

## 2017-12-10 NOTE — Telephone Encounter (Signed)
New Message:   Patient calling about sleep monitor she states she states she needed it back would like to speak with a nurse. Please call patient back.

## 2017-12-10 NOTE — Telephone Encounter (Signed)
Reached out to patient to inform her that [facility] informed me that medicare has approved her request for a new a PAP machine and [facility] will be calling her to come into their office to be fitted for a mask and education on how to operate the PAP machine. Patient has a 10 week follow up appointment scheduled for 1/24/ 2019 at 1:40.. Patient understands she needs to keep this appointment for insurance compliance. Patient was grateful for the call and thanked me.

## 2017-12-12 NOTE — Telephone Encounter (Signed)
Reached out to patient to see why she asked [facility] to cancel her appointment to be set up on her [place]. Patient states she does not have transportation and can not get out to the [facility]  office to get her [place]. Patient states her PCP got her set up with [facility] and she would like to stay with [facility] because it is closer for her. I called [facility] Ivin Booty) to inform her of the patients decision and Ivin Booty replied that she had finally got the authorization and had set an appointment for the patient to come and pick up her Bipap on 12/21/17. Ivin Booty states if patient is sure she wants to cancel she will cancel everything. The patient said she was sure she wanted to cancel with [facility].

## 2017-12-14 ENCOUNTER — Telehealth: Payer: Self-pay | Admitting: Cardiology

## 2017-12-14 NOTE — Telephone Encounter (Signed)
Called patient back. Ben, Dr. Theodosia Blender nurse, might have called her to schedule lab work. Patient was suppose to start taking potassium 20 meq daily and then have BMET check in a week. Patient stated she has not started potassium yet, but she will start this weekend. Patient has an apointment next week for a monitor, and she wants to do lab work that day. Made lab appointment for patient to have BMET after monitor appointment. Patient verbalized understanding.

## 2017-12-14 NOTE — Telephone Encounter (Signed)
New message ° ° ° °Pt is calling back for lab results. °

## 2017-12-19 ENCOUNTER — Other Ambulatory Visit: Payer: Medicaid Other

## 2017-12-20 ENCOUNTER — Encounter: Payer: Self-pay | Admitting: Cardiology

## 2017-12-25 ENCOUNTER — Other Ambulatory Visit: Payer: Self-pay | Admitting: Cardiology

## 2018-01-10 ENCOUNTER — Ambulatory Visit: Payer: Medicaid Other | Admitting: Cardiology

## 2018-03-01 ENCOUNTER — Ambulatory Visit: Payer: Medicaid Other | Admitting: Cardiology

## 2018-03-08 ENCOUNTER — Telehealth: Payer: Self-pay | Admitting: *Deleted

## 2018-03-08 NOTE — Telephone Encounter (Signed)
Informed patient of compliancePatient is aware and agreeable to AHI being within range at 4.3. Patient is aware and agreeable to improving in compliance with machine usage

## 2018-03-08 NOTE — Telephone Encounter (Signed)
-----   Message from Sueanne Margarita, MD sent at 03/07/2018  2:22 PM EST ----- Good AHI on PAP but needs to improve compliance

## 2018-04-29 ENCOUNTER — Telehealth: Payer: Self-pay | Admitting: Pulmonary Disease

## 2018-04-29 NOTE — Telephone Encounter (Signed)
televisit 02/24 at 930am

## 2018-04-29 NOTE — Telephone Encounter (Signed)
Needs E-visit today or tomorrow with me

## 2018-04-29 NOTE — Telephone Encounter (Signed)
Primary Pulmonologist: Mann[place] Last office visit and with whom: 04/18/17 with Dr. Vaughan Browner What do we see them for (pulmonary problems): chronic bronchitis Last OV assessment/plan: Instructions   I will start you on cephalexin 500 mg twice daily for 10 days Continue to work on weight loss and exercise Follow-up in 6 months.         Was appointment offered to patient (explain)?  Pt requesting prescription   Reason for call: called and spoke with pt who states she needs a refill of cephalexin abx due to having symptoms of cough with yellow to green which she says she has had x2 days.  Pt denies any fever and denies any recent travel and states she has not been around anyone that has been sick. Pt denies any increased SOB or chest congestion.  Beth, please advise on this for pt. Thanks!

## 2018-04-30 ENCOUNTER — Other Ambulatory Visit: Payer: Self-pay

## 2018-04-30 ENCOUNTER — Telehealth (INDEPENDENT_AMBULATORY_CARE_PROVIDER_SITE_OTHER): Payer: Medicaid Other | Admitting: Primary Care

## 2018-04-30 DIAGNOSIS — I10 Essential (primary) hypertension: Secondary | ICD-10-CM

## 2018-04-30 DIAGNOSIS — I5032 Chronic diastolic (congestive) heart failure: Secondary | ICD-10-CM

## 2018-04-30 DIAGNOSIS — J9610 Chronic respiratory failure, unspecified whether with hypoxia or hypercapnia: Secondary | ICD-10-CM | POA: Diagnosis not present

## 2018-04-30 DIAGNOSIS — M797 Fibromyalgia: Secondary | ICD-10-CM | POA: Insufficient documentation

## 2018-04-30 MED ORDER — TRAZODONE HCL 100 MG PO TABS: 100.0000 mg | ORAL_TABLET | Freq: Every evening | ORAL | 3 refills | Status: DC | PRN

## 2018-04-30 MED ORDER — DOXYCYCLINE HYCLATE 100 MG PO TABS: 100.0000 mg | ORAL_TABLET | Freq: Two times a day (BID) | ORAL | 0 refills | Status: DC

## 2018-04-30 NOTE — Progress Notes (Signed)
Virtual Visit via Telephone Note  I connected with Jenna Chandler on 04/30/18 at  9:30 AM EDT by telephone and verified that I am speaking with the correct person using two identifiers.   I discussed the limitations, risks, security and privacy concerns of performing an evaluation and management service by telephone and the availability of in person appointments. I also discussed with the patient that there may be a patient responsible charge related to this service. The patient expressed understanding and agreed to proceed.   History of Present Illness: 53 year old female former smoker quit in 2004 (30 pack year hx). PMH significant for HTN, diastolic CHF, chronic respiratory failure, COPD GOLD C, chronic bronchitis, OSA, morbid obesity. Patient of Dr. Vaughan Browner, last seen in office 04/18/17. She is on O2 at night with CPAP.   Patient is at home, agreeing to [place] today. Myself and Ezabella Teska presents on phone call. My nurse Braulio Conte helped set up follow-up and printed AVS to mail to patient.   Patient called with complaints of increased cough productive with new colored yellow/green mucus. Cough is not new for her. She has been taking mucinex. Has rescue inhaler on hand if needed. Her breathing has been the same, she is not experiencing any wheezing or increased shortness of breath. Continues to wear CPAP at night with oxygen. Reports O2 level 97-98% RA during a office visit 3 months ago. She is working on weight loss, trying keto and intermittent fasting. Down to 432 at last visit (from 455). No recent travel or sick contact. Denies chills, sweats, sob or wheezing.   Observations/Objective:  Breathing appear normal over the phone. Able to speak in full sentences.  No vital signs available  No thermometer  No Blood pressure cuff No pulse oximeter  No scale   Assessment and Plan:  COPD exacerbation d/t bronchitis, tx doxycycline course and continue mucinex twice daily     Follow  Up Instructions:  Follow-up in July with Dr. Vaughan Browner recommended. His schedule is not available yet, our visit will call patient closed to date to make this appointment  I discussed the assessment and treatment plan with the patient. The patient was provided an opportunity to ask questions and all were answered. The patient agreed with the plan and demonstrated an understanding of the instructions.   The patient was advised to call back or seek an in-person evaluation if the symptoms worsen or if the condition fails to improve as anticipated.  I provided 30 minutes of non-face-to-face time during this encounter. Phone call time 9:45-10:15.    Martyn Ehrich, NP

## 2018-04-30 NOTE — Patient Instructions (Addendum)
Thank you for allowing me to speak with you on the phone today We went over your medication and problem list  Treating you for COPD exacerbation d/t bronchitis symptoms   Recommedantions: Continue mucinex twice daily Stay well hydrated Monitor for fever >100 or worsening cough  Stay home over the next few weeks to minimize your chance of contracting COVID-19 We will try and place an order for blood pressure cuff, pulse oximeter and scale through medical supply company  Rx: Doxycyline sent to pharmacy   Follow-up: July with Dr. Vaughan Browner  Call if you do not improve or symptoms worsen or needed to set up an E-vist     Coronavirus (COVID-19) Are you at risk?  Are you at risk for the Coronavirus (COVID-19)?  To be considered HIGH RISK for Coronavirus (COVID-19), you have to meet the following criteria:  . Traveled to [place], [place], [place], [place] or [place]; or in the [place] to [place], [place], [place], or [place]; and have fever, cough, and shortness of breath within the last 2 weeks of travel OR . Been in close contact with a person diagnosed with COVID-19 within the last 2 weeks and have fever, cough, and shortness of breath . IF YOU DO NOT MEET THESE CRITERIA, YOU ARE CONSIDERED LOW RISK FOR COVID-19.  What to do if you are HIGH RISK for COVID-19?  Marland Kitchen If you are having a medical emergency, call 911. . Seek medical care right away. Before you go to a doctor's office, urgent care or emergency department, call ahead and tell them about your recent travel, contact with someone diagnosed with COVID-19, and your symptoms. You should receive instructions from your physician's office regarding next steps of care.  . When you arrive at healthcare provider, tell the healthcare staff immediately you have returned from visiting [place], [place], [place], [place] or [place]; or traveled in the [place] to [place], [place], [place], or [place]; in the last two weeks or  you have been in close contact with a person diagnosed with COVID-19 in the last 2 weeks.   . Tell the health care staff about your symptoms: fever, cough and shortness of breath. . After you have been seen by a medical provider, you will be either: o Tested for (COVID-19) and discharged home on quarantine except to seek medical care if symptoms worsen, and asked to  - Stay home and avoid contact with others until you get your results (4-5 days)  - Avoid travel on public transportation if possible (such as bus, train, or airplane) or o Sent to the Emergency Department by EMS for evaluation, COVID-19 testing, and possible admission depending on your condition and test results.  What to do if you are LOW RISK for COVID-19?  Reduce your risk of any infection by using the same precautions used for avoiding the common cold or flu:  Marland Kitchen Wash your hands often with soap and warm water for at least 20 seconds.  If soap and water are not readily available, use an alcohol-based hand sanitizer with at least 60% alcohol.  . If coughing or sneezing, cover your mouth and nose by coughing or sneezing into the elbow areas of your shirt or coat, into a tissue or into your sleeve (not your hands). . Avoid shaking hands with others and consider head nods or verbal greetings only. . Avoid touching your eyes, nose, or mouth with unwashed hands.  . Avoid close contact with people who are  sick. . Avoid places or events with large numbers of people in one location, like concerts or sporting events. . Carefully consider travel plans you have or are making. . If you are planning any travel outside or inside the [place], visit the CDC's Travelers' Health webpage for the latest health notices. . If you have some symptoms but not all symptoms, continue to monitor at home and seek medical attention if your symptoms worsen. . If you are having a medical emergency, call 911.   Stamps / e-Visit: [email or web address]         [facility]: [facility]: [phone]                   [facility]: [phone]

## 2018-09-18 ENCOUNTER — Telehealth: Payer: Self-pay | Admitting: Pulmonary Disease

## 2018-09-18 ENCOUNTER — Other Ambulatory Visit: Payer: Self-pay

## 2018-09-18 ENCOUNTER — Encounter: Payer: Self-pay | Admitting: Pulmonary Disease

## 2018-09-18 ENCOUNTER — Ambulatory Visit (INDEPENDENT_AMBULATORY_CARE_PROVIDER_SITE_OTHER): Payer: Medicaid Other | Admitting: Pulmonary Disease

## 2018-09-18 DIAGNOSIS — J42 Unspecified chronic bronchitis: Secondary | ICD-10-CM

## 2018-09-18 MED ORDER — AZITHROMYCIN 250 MG PO TABS: 250.0000 mg | ORAL_TABLET | Freq: Every day | ORAL | 0 refills | Status: DC

## 2018-09-18 MED ORDER — BUDESONIDE-FORMOTEROL FUMARATE 80-4.5 MCG/ACT IN AERO: 2.0000 | INHALATION_SPRAY | Freq: Two times a day (BID) | RESPIRATORY_TRACT | 5 refills | Status: DC

## 2018-09-18 NOTE — Telephone Encounter (Signed)
Called pharmacy back. Verified per Dr. Vaughan Browner directions on zpak 2 tabs day 1 then1 tab daily until done. Shaila read back directions. Also ZY:TMMITVI bronchitis. Nothing further needed.

## 2018-09-18 NOTE — Patient Instructions (Addendum)
Glad you are doing well with your breathing We will call in a Z-Pak for bronchitis Continue albuterol as needed We will call in Symbicort 80 twice a day. Follow-up in 6 months.

## 2018-09-18 NOTE — Progress Notes (Signed)
Virtual Visit via Telephone Note  I connected with Jenna Chandler on 09/18/18 at  2:45 PM EDT by telephone and verified that I am speaking with the correct person using two identifiers.  Location: Patient: Home Provider: [facility], [place], [place]   I discussed the limitations, risks, security and privacy concerns of performing an evaluation and management service by telephone and the availability of in person appointments. I also discussed with the patient that there may be a patient responsible charge related to this service. The patient expressed understanding and agreed to proceed.  History of Present Illness: Chief complaint:   COPD. Chronic bronchitis. Morbid obesity OSA on CPAP  HPI: [name] is here for follow-up of her COPD,chronic bronchitis., morbid obesity. She has recurrent attacks of bronchitis and needs to go on antibiotics frequently.   Last seen by Derl Barrow, NP and given doxycycline.  Reports that her breathing is doing well however she has worsening cough with yellow-green mucus production. Currently on just albuterol as needed.  Observations/Objective: 53 year old with COPD, chronic bronchitis She continues to have recurrent issues with COPD, chronic bronchitis We will call in a Z-Pak and start Symbicort  We will need pulmonary function tests before next visit Advised to continue on weight loss with diet and exercise.  Assessment and Plan: - Z-Pak - Symbicort  Follow Up Instructions: Follow-up in 6 months.   I discussed the assessment and treatment plan with the patient. The patient was provided an opportunity to ask questions and all were answered. The patient agreed with the plan and demonstrated an understanding of the instructions.   The patient was advised to call back or seek an in-person evaluation if the symptoms worsen or if the condition fails to improve as anticipated.  I provided 25 minutes of non-face-to-face  time during this encounter.   Marshell Garfinkel MD [facility] 09/18/2018, 2:15 PM

## 2018-11-02 ENCOUNTER — Other Ambulatory Visit: Payer: Self-pay | Admitting: Primary Care

## 2018-11-04 NOTE — Telephone Encounter (Signed)
yes

## 2018-11-04 NOTE — Telephone Encounter (Signed)
Appropriate for refill

## 2018-11-28 ENCOUNTER — Telehealth: Payer: Self-pay | Admitting: Pulmonary Disease

## 2018-11-28 MED ORDER — DOXYCYCLINE HYCLATE 100 MG PO TABS: 100.0000 mg | ORAL_TABLET | Freq: Two times a day (BID) | ORAL | 0 refills | Status: AC

## 2018-11-28 NOTE — Telephone Encounter (Signed)
Called and spoke to pt. Informed her of the recs per Dr. Mannam. Pt verbalized understanding and denied any further questions or concerns at this time.   

## 2018-11-28 NOTE — Telephone Encounter (Signed)
I have ordered doxycycline 100 mg twice daily for 7 days

## 2018-11-28 NOTE — Telephone Encounter (Signed)
Called and spoke to pt. Pt c/o chest congestion, prod cough with brown and yellow mucus, sinus congestion x 1 week. Pt denies SOB, CP/tightness, f/c/s. Pt states she has been taking a Benadryl and Mucinex and it is helping some. Pt is requesting abx be sent to pharmacy. Pt denies any known exposure to covid.   Dr. Vaughan Browner please advise. Thank you.

## 2019-01-24 ENCOUNTER — Telehealth: Payer: Self-pay | Admitting: Pulmonary Disease

## 2019-01-24 MED ORDER — PREDNISONE 10 MG PO TABS: 20.0000 mg | ORAL_TABLET | Freq: Every day | ORAL | 0 refills | Status: DC

## 2019-01-24 MED ORDER — DOXYCYCLINE HYCLATE 100 MG PO TABS: 100.0000 mg | ORAL_TABLET | Freq: Two times a day (BID) | ORAL | 0 refills | Status: DC

## 2019-01-24 NOTE — Telephone Encounter (Signed)
Routing to Dr. Vaughan Browner for follow-up as he is in office today.  Wyn Quaker FNP

## 2019-01-24 NOTE — Telephone Encounter (Signed)
Spoke with pt, she has yellow/green mucus from her nasal passage. She has had symptoms for a week but denies cough, fever, or any other symptoms. She states she always calls to get a refill on Doxy because it usually clears it up her symptoms. She is not on oxygen , denies being around anyone with Covid 19, and denies travel. She has used alka seltzer cold medication and mucinex for her symptoms.  This should have been a sick message but was not marked red. Aaron Edelman can you advise since it is late. Thanks.    Patient Instructions by Marshell Garfinkel, MD at 09/18/2018 2:45 PM Author: Marshell Garfinkel, MD Author Type: Physician Filed: 09/18/2018 2:25 PM  Note Status: Addendum Cosign: Cosign Not Required Encounter Date: 09/18/2018  Editor: Marshell Garfinkel, MD (Physician)  Prior Versions: 1. Marshell Garfinkel, MD (Physician) at 09/18/2018 2:20 PM - Signed    Glad you are doing well with your breathing We will call in a Z-Pak for bronchitis Continue albuterol as needed We will call in Symbicort 80 twice a day. Follow-up in 6 months.

## 2019-01-24 NOTE — Telephone Encounter (Signed)
Okay to send course of doxycycline.  Doxycycline >>> 1 100 mg tablet every 12 hours for 7 days >>>take with food  >>>wear sunscreen   Prednisone 10mg  tablet  >>>Take 2 tablets (20 mg total) daily for the next 5 days >>> Take with food in the morning  Please place order   Patient needs acute televisit with APP next week to follow-up regarding her symptoms.  Please schedule.  will route to Dr. Vaughan Browner as Juluis Rainier.  Wyn Quaker FNP

## 2019-01-24 NOTE — Telephone Encounter (Signed)
Called and spoke with pt letting her know that we were sending rx for doxy and pred to pharmacy for but we needed to schedule televisit next week to follow up. Pt verbalized understanding. Rx sent to pharmacy and televisit scheduled. Nothing further needed.

## 2019-01-26 NOTE — Progress Notes (Signed)
Virtual Visit via Telephone Note  I connected with Jenna Chandler on 01/27/19 at 11:00 AM EST by telephone and verified that I am speaking with the correct person using two identifiers.  Location: Patient: Home Provider: Office [facility] - [id number], [place], [place], [place] [address]   I discussed the limitations, risks, security and privacy concerns of performing an evaluation and management service by telephone and the availability of in person appointments. I also discussed with the patient that there may be a patient responsible charge related to this service. The patient expressed understanding and agreed to proceed.  Patient consented to consult via telephone: Yes People present and their role in Jenna Chandler care: Jenna Chandler     History of Present Illness:  53 year old female former smoker followed in our office for COPD, obstructive sleep apnea and chronic respiratory failure  Past medical history: Hypertension, morbid obesity, diastolic CHF, fibromyalgia Smoking history: Former smoker. Quit 2004. 30 pack years.  Maintenance: Symbicort 80  Patient of Dr. Vaughan Browner  Chief complaint: Discolored nasal drainage  53 year old female completing a telephonic visit with our office today to discuss her acute symptoms of discolored nasal drainage, congestion and cough.  Patient contacted our office on 01/24/2019.  Patient was scheduled for follow-up and empirically prescribed doxycycline and a short course of prednisone.  Patient reports that she started the doxycycline the prednisone on 01/26/2019.  She does feel that her symptoms are improving.  Unfortunately she is only using her Symbicort as needed as needed.  She is unsure if it is helping.  She feels that she benefits more from using her rescue inhaler.  She uses her rescue inhaler about 1-2 times a day.  She used to benefit from having a nebulizer as well as albuterol nebulized meds but her nebulizer broke and she has not received a new  one.  We can work on this today.  Patient continues to be maintained on CPAP.  I do not have a compliance report to confirm this.  She reports she uses it every night.  She used to be managed also on chronic oxygen therapy.  She is currently on room air.  She reports her oxygen saturations ranged between 95 and 98% on room air.  She has not checked them after physical exertion.  She reports that she will start doing that.   Patient is exceptionally cautious of presenting to office visits in person due to the SARS-CoV-2 pandemic.  She is resistant to scheduling a pulmonary function test due to this.  She would like to schedule her pulmonary function test in March/2021 due to these concerns.  She knows that there are earlier options available.  Observations/Objective:  02/09/2016-CTA chest-significant study limitations due to body habitus and poor opacification of the pulmonary arteries, no acute findings are evident, lungs are clear  PFTs 08/14/11 FVC 1.42 (42%) FEV1 1.06 (41%) F/F 74 FEF 25-75 percent 0.86 [29%] TLC 57% DLCO 36% Minimal obstruction, small airway disease as shown by reduction in mid flow rates. There is a significant response in mod flow rates after albuterol. Moderate restriction and severe DLCO production. DLCO corrects for alveolar volume.   Social History   Tobacco Use  Smoking Status Former Smoker  . Packs/day: 2.00  . Years: 15.00  . Pack years: 30.00  . Types: Cigarettes  . Quit date: 05/24/2002  . Years since quitting: 16.6  Smokeless Tobacco Never Used   Immunization History  Administered Date(s) Administered  . Influenza Split 10/26/2011,  11/05/2012  . Influenza,inj,Quad PF,6+ Mos 12/29/2013  . 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 Plan:  Continue Symbicort 80  Needs walk with next ov  Needs pulmonary function  test Nebulizer order today Okay to use albuterol/rescue inhaler as needed for shortness of breath or wheezing Continue to monitor oxygen levels at home and report to our office of your oxygen saturations are below 90%  Chronic respiratory failure Patient reporting oxygen levels are running 95 to 97% on room air  Plan: Continue to monitor oxygen saturations at home Patient would benefit from a walk at next in-person office visit  Restrictive lung disease Significant restrictive lung disease on 2013 pulmonary function test  Plan: Obtain repeat pulmonary function testing  Morbid obesity Emphasized again to the patient today that her morbid obesity contributes to her worsening shortness of breath.  She already has significant restriction on her 2013 pulmonary function test.  Her weight continues to increase.  I recommended that she have an in person office evaluation as well as an earlier pulmonary function test.  She declined due to concerns over the SARS-CoV-2 pandemic.  She reports that she would consider these in March/2021   Follow Up Instructions:  Return in about 3 months (around 04/27/2019), or if symptoms worsen or fail to improve, for Follow up with Dr. Vaughan Browner, Follow up for PFT.   Reviewed with patient multiple times that I do not agree with delaying her work-up with her pulmonary function test.  She has concerns regarding the SARS-CoV-2 pandemic.  She is aware that there are earlier slots available to complete pulmonary function testing.  She is choosing to wait till March/2021.  We can respect the patient's choice.  Although it is a delay of care.  I discussed the assessment and treatment plan with the patient. The patient was provided an opportunity to ask questions and all were answered. The patient agreed with the plan and demonstrated an understanding of the instructions.   The patient was advised to call back or seek an in-person evaluation if the symptoms worsen or if  the condition fails to improve as anticipated.  I provided 25 minutes of non-face-to-face time during this encounter.   Lauraine Rinne, NP

## 2019-01-27 ENCOUNTER — Ambulatory Visit (INDEPENDENT_AMBULATORY_CARE_PROVIDER_SITE_OTHER): Payer: Medicaid Other | Admitting: Pulmonary Disease

## 2019-01-27 ENCOUNTER — Encounter: Payer: Self-pay | Admitting: Pulmonary Disease

## 2019-01-27 ENCOUNTER — Other Ambulatory Visit: Payer: Self-pay

## 2019-01-27 DIAGNOSIS — J449 Chronic obstructive pulmonary disease, unspecified: Secondary | ICD-10-CM | POA: Diagnosis not present

## 2019-01-27 DIAGNOSIS — G4733 Obstructive sleep apnea (adult) (pediatric): Secondary | ICD-10-CM

## 2019-01-27 DIAGNOSIS — J984 Other disorders of lung: Secondary | ICD-10-CM | POA: Diagnosis not present

## 2019-01-27 DIAGNOSIS — J9610 Chronic respiratory failure, unspecified whether with hypoxia or hypercapnia: Secondary | ICD-10-CM | POA: Diagnosis not present

## 2019-01-27 MED ORDER — ALBUTEROL SULFATE (2.5 MG/3ML) 0.083% IN NEBU: 2.5000 mg | INHALATION_SOLUTION | Freq: Four times a day (QID) | RESPIRATORY_TRACT | 2 refills | Status: DC | PRN

## 2019-01-27 NOTE — Assessment & Plan Note (Signed)
Significant restrictive lung disease on 2013 pulmonary function test  Plan: Obtain repeat pulmonary function testing

## 2019-01-27 NOTE — Assessment & Plan Note (Signed)
Patient reporting oxygen levels are running 95 to 97% on room air  Plan: Continue to monitor oxygen saturations at home Patient would benefit from a walk at next in-person office visit

## 2019-01-27 NOTE — Patient Instructions (Signed)
You were seen today by Lauraine Rinne, NP  for:   1. 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  - The less you use it, the better it will work when you need it. - Ok to use up to 2 puffs  every 4 hours if you must but call for immediate appointment if use goes up over your usual need - Don't leave home without it !!  (think of it like the spare tire for your car)   Order placed today for new nebulizer  Can also use albuterol nebulized meds every 6 hours as needed for shortness of breath or wheezing in place of your rescue inhaler  2. OSA (obstructive sleep apnea)  We recommend that you continue using your CPAP daily >>>Keep up the hard work using your device >>> Goal should be wearing this for the entire night that you are sleeping, at least 4 to 6 hours  Remember:  . Do not drive or operate heavy machinery if tired or drowsy.  . Please notify the supply company and office if you are unable to use your device regularly due to missing supplies or machine being broken.  . Work on maintaining a healthy weight and following your recommended nutrition plan  . Maintain proper daily exercise and movement  . Maintaining proper use of your device can also help improve management of other chronic illnesses such as: Blood pressure, blood sugars, and weight management.   BiPAP/ CPAP Cleaning:  >>>Clean weekly, with Dawn soap, and bottle brush.  Set up to air dry. >>> Wipe mask out daily with wet wipe or towelette   3. Chronic respiratory failure, unspecified whether with hypoxia or hypercapnia  ([name])  Continue to monitor oxygen saturations at home Notify our office of your oxygen saturations are below 90%  4. Restrictive lung disease  Obtain pulmonary function testing to compare to 2013  Continue to increase overall physical activity to reduce body weight and BMI   We recommend today:  Orders Placed This Encounter  Procedures  . Ambulatory Referral for DME    Referral Priority:   Routine    Referral Type:   Durable Medical Equipment Purchase    Number of Visits Requested:   1   Orders Placed This Encounter  Procedures  . Ambulatory Referral for DME   Meds ordered this encounter  Medications  . albuterol (PROVENTIL) (2.5 MG/3ML) 0.083% nebulizer solution    Sig: Take 3 mLs (2.5 mg total) by nebulization every 6 (six) hours as needed for wheezing or shortness of breath.    Dispense:  360 mL    Refill:  2    Dx: J44.9    Follow Up:    Return in about 3 months (around 04/27/2019), or if symptoms worsen or fail to improve, for Follow up with Dr. Vaughan Browner, Follow up for PFT, With walk in office.   Please do your part to reduce the spread of COVID-19:      Reduce your risk of any infection  and COVID19 by using  the similar precautions used for avoiding the common cold or flu:  Marland Kitchen Wash your hands often with soap and warm water for at least 20 seconds.  If soap and water are not readily available, use an alcohol-based hand sanitizer with at least 60% alcohol.  . If coughing or sneezing, cover your mouth and nose by coughing or sneezing into the elbow areas of your shirt or coat, into a tissue or into your sleeve (not your hands). Langley Gauss A MASK when in public  . Avoid shaking hands with others and consider head nods or verbal greetings only. . Avoid touching your eyes, nose, or mouth with unwashed hands.  . Avoid close contact with people who are sick. . Avoid places or events with large numbers of people in one location, like concerts or sporting events. . If you have  some symptoms but not all symptoms, continue to monitor at home and seek medical attention if your symptoms worsen. . If you are having a medical emergency, call 911.   [place] / e-Visit: [email or web address]         [facility]: [facility]: [id number]                   [facility]: [id number]     It is flu season:   >>> Best ways to protect herself from the flu: Receive the yearly flu vaccine, practice good hand hygiene washing with soap and also using hand sanitizer when available, eat a nutritious meals, get adequate rest, hydrate appropriately   Please contact the office if your symptoms worsen or you have concerns that you are not improving.   Thank you for choosing [facility] for your healthcare, and for allowing [place] to partner with you on your healthcare journey. I am thankful to be able to provide care to you today.   Wyn Quaker FNP-C

## 2019-01-27 NOTE — Assessment & Plan Note (Signed)
Emphasized again to the patient today that her morbid obesity contributes to her worsening shortness of breath.  She already has significant restriction on her 2013 pulmonary function test.  Her weight continues to increase.  I recommended that she have an in person office evaluation as well as an earlier pulmonary function test.  She declined due to concerns over the SARS-CoV-2 pandemic.  She reports that she would consider these in March/2021

## 2019-01-27 NOTE — Assessment & Plan Note (Signed)
Plan: Continue CPAP therapy 

## 2019-01-27 NOTE — Assessment & Plan Note (Addendum)
Plan:  Continue Symbicort 80  Needs walk with next ov  Needs pulmonary function test Nebulizer order today Okay to use albuterol/rescue inhaler as needed for shortness of breath or wheezing Continue to monitor oxygen levels at home and report to our office of your oxygen saturations are below 90%

## 2019-03-03 IMAGING — CT CT ABD-PELV W/ CM
2 of 5 series · 16 of 46 positions shown, 18 images · IV contrast (ISOVUE)
Comparison: CT of the abdomen and pelvis performed 06/25/2017

CLINICAL DATA: Subacute onset of right flank pain. Right upper
quadrant and right lower quadrant abdominal pain and nausea.

EXAM:
CT ABDOMEN AND PELVIS WITH CONTRAST
TECHNIQUE: Multidetector CT imaging of the abdomen and pelvis was performed
using the standard protocol following bolus administration of
intravenous contrast.
CONTRAST:  100mL HLVC03-QAA IOPAMIDOL (HLVC03-QAA) INJECTION 61%

[Series 2: axial st · axial · 0.91mm/px · z∈[+920,+1310]mm · 13 of 92 slices shown, 15 images]
[im 7/92  soft-tissue]
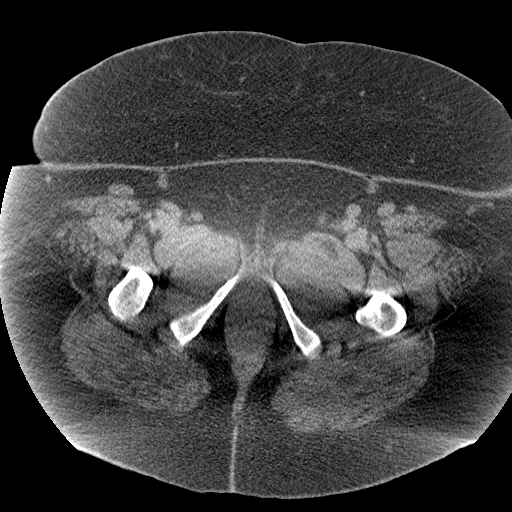
[im 7/92  bone]
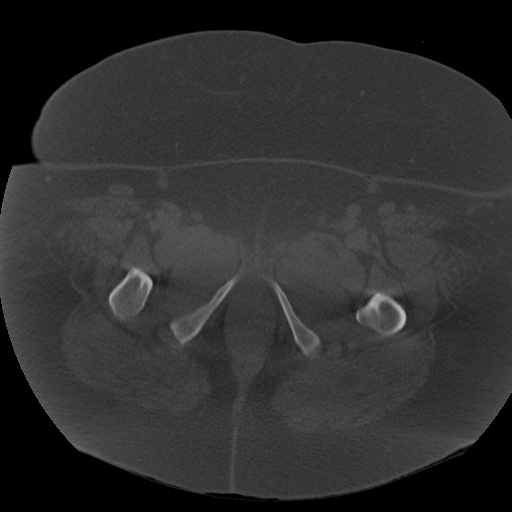
[im 13/92  soft-tissue]
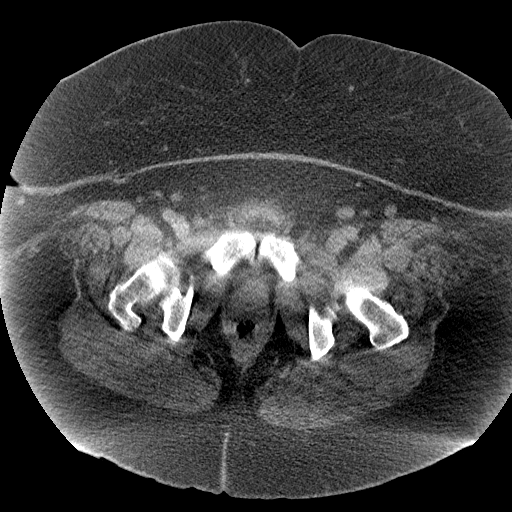
[im 19/92  soft-tissue]
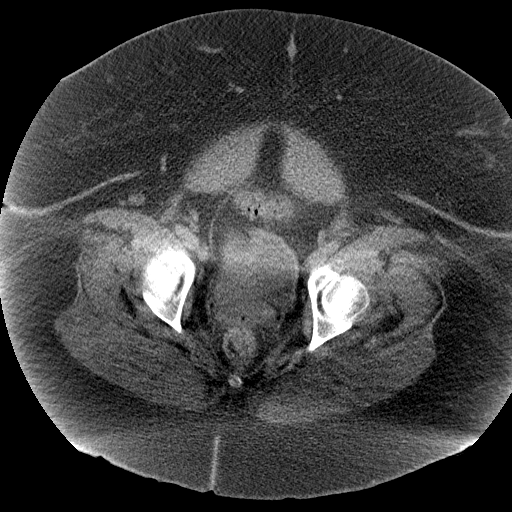
[im 25/92  soft-tissue]
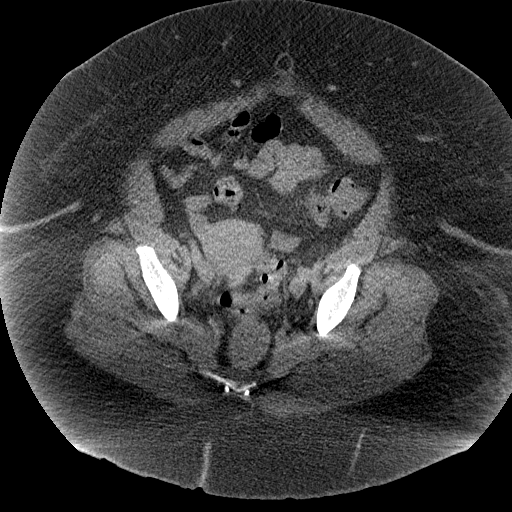
[im 31/92  soft-tissue]
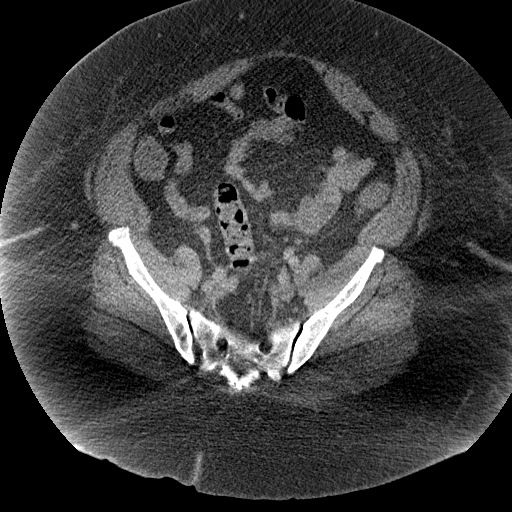
[im 37/92  soft-tissue]
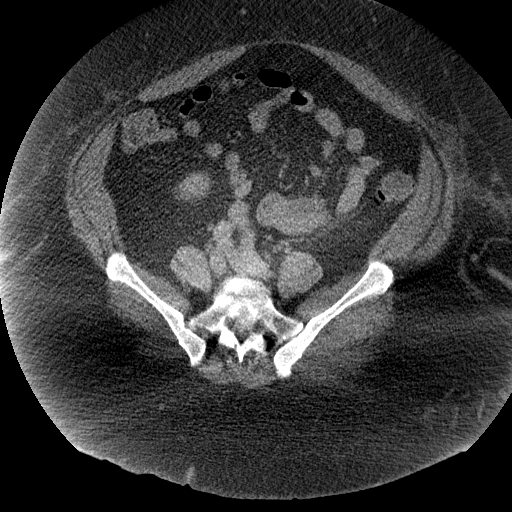
[im 49/92  soft-tissue]
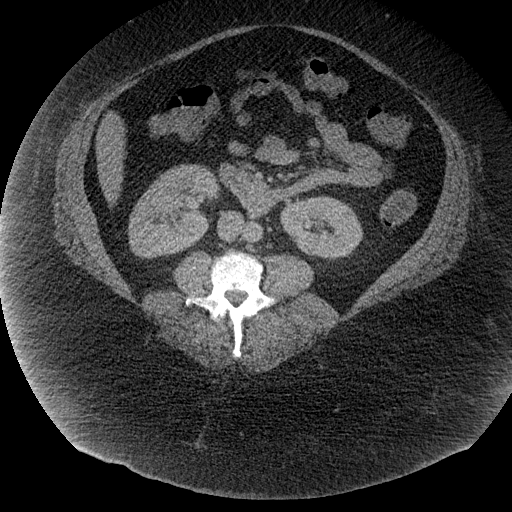
[im 55/92  soft-tissue]
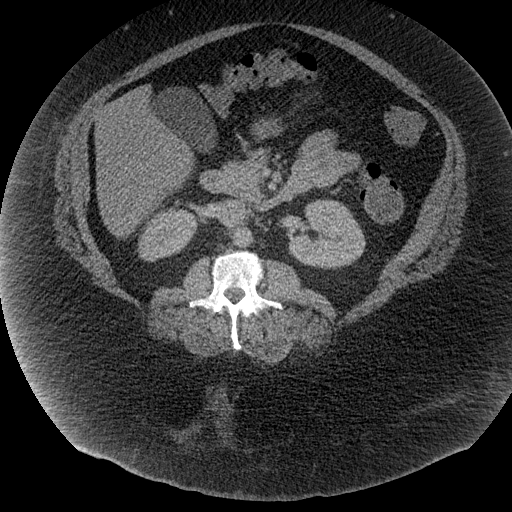
[im 61/92  soft-tissue]
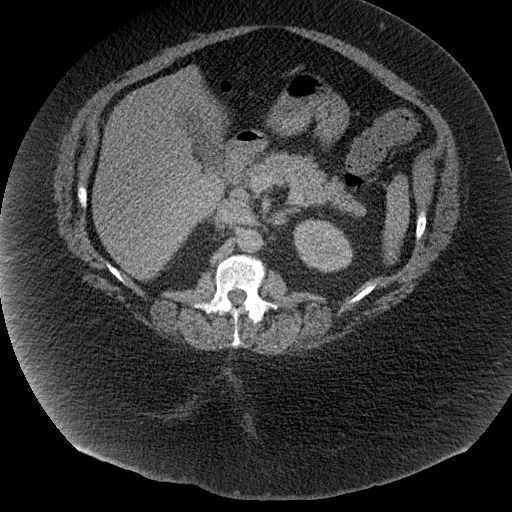
[im 61/92  bone]
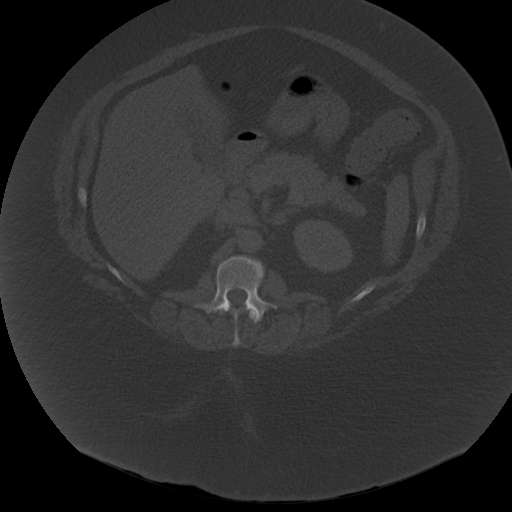
[im 67/92  soft-tissue]
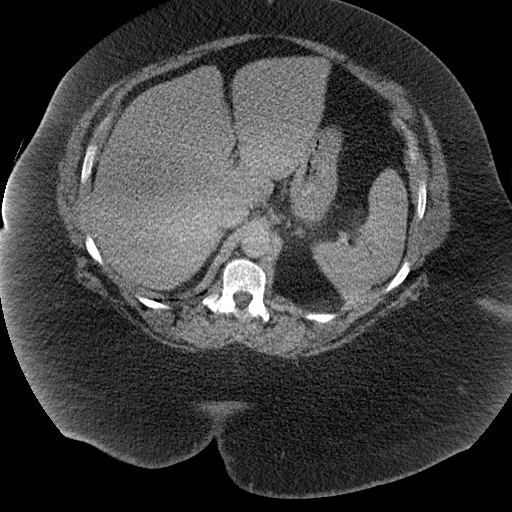
[im 73/92  soft-tissue]
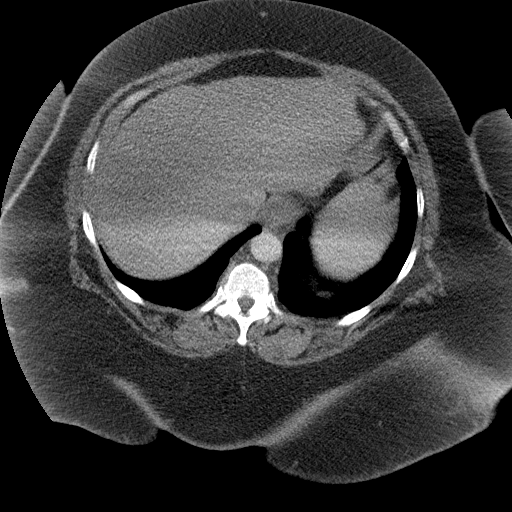
[im 79/92  soft-tissue]
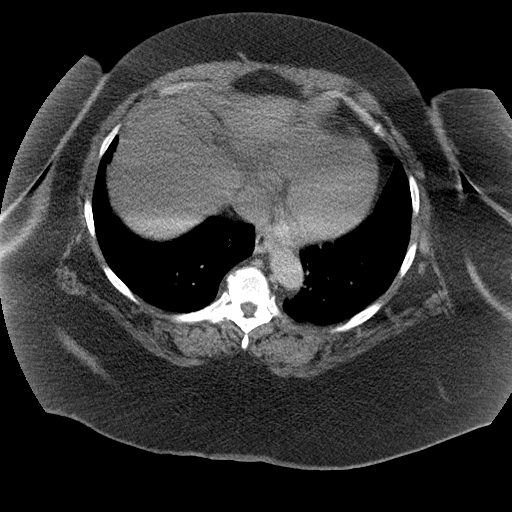
[im 85/92  soft-tissue]
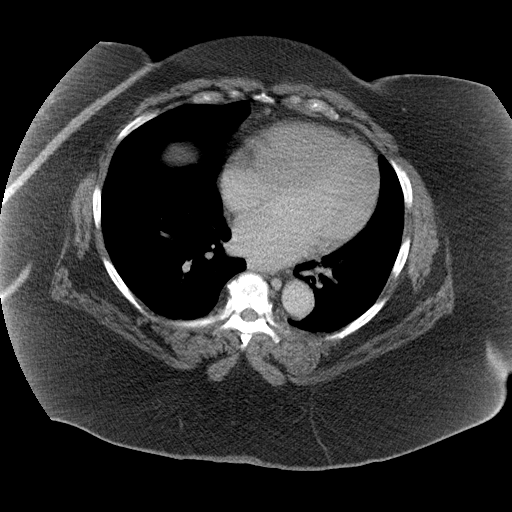

[Series 6: coronal st · coronal · 0.92mm/px · 3 of 157 slices shown]
[im 53/157  soft-tissue]
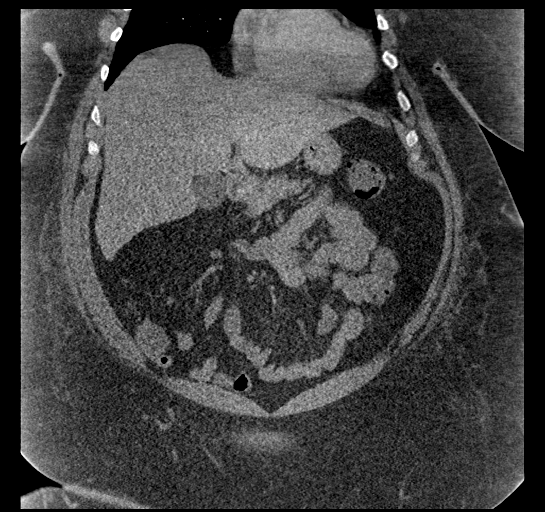
[im 70/157  soft-tissue]
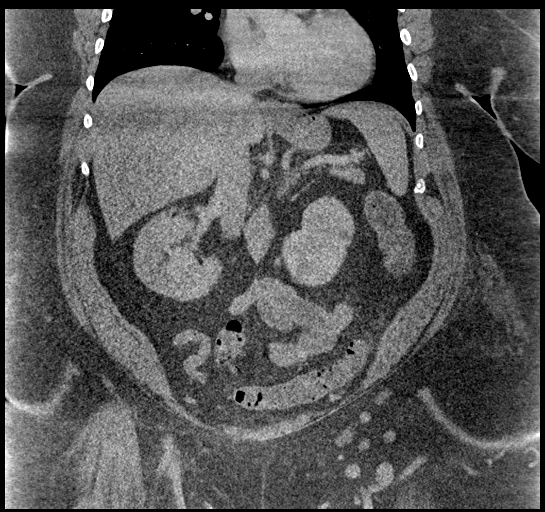
[im 87/157  soft-tissue]
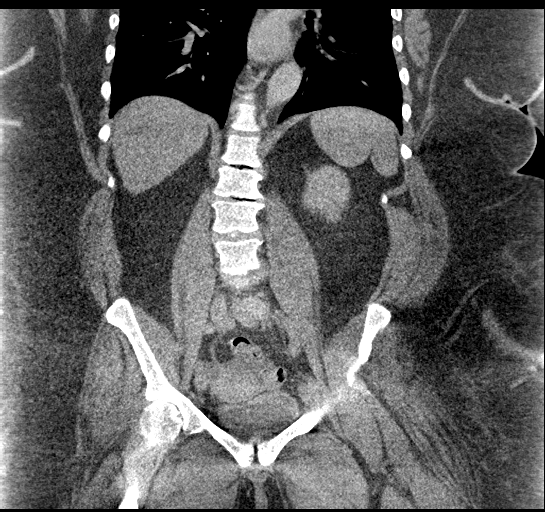

[16 of 46 positions shown; findings below may reference images not displayed]

FINDINGS: Lower chest: Minimal right basilar atelectasis is noted. The
visualized portions of the mediastinum are unremarkable.

Hepatobiliary: The liver is unremarkable in appearance. The
gallbladder is unremarkable in appearance. The common bile duct
remains normal in caliber.

Pancreas: The pancreas is within normal limits.

Spleen: The spleen is unremarkable in appearance.

Adrenals/Urinary Tract: The adrenal glands are unremarkable in
appearance. The kidneys are within normal limits. There is no
evidence of hydronephrosis. No renal or ureteral stones are
identified. No perinephric stranding is seen.

Stomach/Bowel: The stomach is unremarkable in appearance. The small
bowel is within normal limits. The appendix is normal in caliber,
without evidence of appendicitis. The colon is unremarkable in
appearance.

Vascular/Lymphatic: The abdominal aorta is unremarkable in
appearance. The inferior vena cava is grossly unremarkable. A few
retroperitoneal nodes measure up to 8 mm in size. This appears
grossly stable from 9192, and may reflect the patient's baseline. No
pelvic sidewall lymphadenopathy is identified.

Reproductive: The bladder is mildly distended and grossly
unremarkable. The uterus is grossly unremarkable. The ovaries are
relatively symmetric. No suspicious adnexal masses are seen.

Other: No additional soft tissue abnormalities are seen.

Musculoskeletal: No acute osseous abnormalities are identified.
Multilevel vacuum phenomenon is noted along the lumbar spine. The
visualized musculature is unremarkable in appearance.
IMPRESSION: 1. No acute abnormality seen to explain the patient's symptoms.
2. Few retroperitoneal nodes measure up to 8 mm in size, grossly
stable from 9192, and may reflect the patient's baseline.

## 2019-06-19 ENCOUNTER — Telehealth: Payer: Self-pay | Admitting: Pulmonary Disease

## 2019-06-19 MED ORDER — DOXYCYCLINE HYCLATE 100 MG PO TABS: 100.0000 mg | ORAL_TABLET | Freq: Two times a day (BID) | ORAL | 0 refills | Status: DC

## 2019-06-19 MED ORDER — PREDNISONE 10 MG PO TABS: ORAL_TABLET | ORAL | 0 refills | Status: DC

## 2019-06-19 NOTE — Telephone Encounter (Signed)
lmtcb for pt.  

## 2019-06-19 NOTE — Telephone Encounter (Signed)
Spoke with pt, aware of recs.  rx's sent to pharmacy.  Nothing further needed at this time- will close encounter.   

## 2019-06-19 NOTE — Telephone Encounter (Signed)
Please send in Doxy 100 mg twice daily and  prednisone taper starting at 40 mg.  Reduce dose by 10 mg every 2 days.

## 2019-06-19 NOTE — Telephone Encounter (Signed)
Pt called back, c/o prod cough with yellow-brown thick mucus, sinus congestion, pnd, sob X3 days.   Denies fever, CP, HA.  States this happens periodically to her, requesting doxycycline and pred taper.  Has been taking OTC mucinex and cold and flu meds, which are not helping with s/s.    Pharmacy: [facility] on [address].    Dr. Vaughan Browner please advise.  Thanks!

## 2019-09-04 ENCOUNTER — Other Ambulatory Visit: Payer: Self-pay | Admitting: Primary Care

## 2019-09-04 NOTE — Telephone Encounter (Signed)
Beth, please advise if you are okay refilling med. 

## 2019-09-04 NOTE — Telephone Encounter (Signed)
Needs visit, last seen in December by Wyn Quaker

## 2019-09-29 ENCOUNTER — Telehealth: Payer: Self-pay | Admitting: Pulmonary Disease

## 2019-09-29 NOTE — Telephone Encounter (Signed)
Spoke with this patient earlier this morning and advised that the infusion team would reach out to her regarding infusion for covid patients.  Patient calling back: Patient states having symptom of cough with blood. Would like antiobotics called into pharmacy. Pharmacy is [facility] [address]. Also would like to know about antibodies treatment for Covid 19. Patient phone number is [phone].   Messaged Wyn Quaker NP regarding her new s/s, advised she will need to go to the ER.  I will try her back again prior to 5 pm.

## 2019-09-29 NOTE — Telephone Encounter (Signed)
Agree.  Thank you TP NP.Wyn Quaker, FNP

## 2019-09-29 NOTE — Telephone Encounter (Signed)
Spoke with patient and relayed information per Wyn Quaker NP.  She verbalized understanding.  Nothing further needed.

## 2019-09-29 NOTE — Telephone Encounter (Signed)
09/29/2019  Patient tested positive for COVID-19 on 09/26/2019.  She is unvaccinated.  We will route to TP NP to see if patient would be a candidate for the monoclonal antibody infusion.  This may be something worth considering for the patient to help with hospitalizations.  Please let the patient know that somebody from the monoclonal antibody infusion team will likely be calling her to see if she would be a candidate for the monoclonal body infusion.  We would recommend this if she is.  If symptoms worsen in the meantime such as chest pain, heart racing, or changes with her respiratory status she needs to present to the emergency room for further evaluation.  Wyn Quaker FNP

## 2019-09-29 NOTE — Telephone Encounter (Signed)
Noted in triage. 

## 2019-09-29 NOTE — Telephone Encounter (Signed)
Patient with COVID-19 test 8/16+.  Symptoms 3 days prior to testing supports her days of symptoms 10 days today.  She is outside the window for possible monoclonal antibody infusion.  Patient continues to have cough congestion intermittent fevers and bloody mucus.  Advised her that she needs to seek emergency room care evaluation as she has high risk for decompensation with underlying COPD.  Also concerned of bloody mucus could be underlying pneumonia and/or concern for possible VTE.  Patient says O2 saturations have been over 90% range in about 94% on room air. She denies any syncope, chest pain, orthopnea. Patient verbalizes understanding and says she will seek emergency room care.

## 2019-09-29 NOTE — Telephone Encounter (Signed)
Called and left another message for the patient advising her to go to the ER if she is coughing up blood.

## 2019-09-29 NOTE — Telephone Encounter (Signed)
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  She already has significant restriction on her 2013 pulmonary function test.  Her weight continues to increase.  I recommended that she have an in person office evaluation as well as an earlier pulmonary function test.  She declined due to concerns over the SARS-CoV-2 pandemic.  She reports that she would consider these in March/2021    Assessment & Plan Note by Lauraine Rinne, NP at 01/27/2019 10:57 AM Author: Lauraine Rinne, NP Author Type: Nurse Practitioner Filed: 01/27/2019 10:57 AM  Note Status: Written Cosign: Cosign Not Required Encounter Date: 01/27/2019  Problem: Restrictive lung disease  Editor: Lauraine Rinne, NP (Nurse Practitioner)                 Significant restrictive lung disease on 2013 pulmonary function test  Plan: Obtain repeat pulmonary function testing    Assessment & Plan Note by Lauraine Rinne, NP at 01/27/2019 10:56 AM Author: Lauraine Rinne, NP Author Type: Nurse Practitioner Filed: 01/27/2019 10:56 AM  Note Status: Written Cosign: Cosign Not Required Encounter Date: 01/27/2019  Problem: Chronic respiratory failure  Editor: Lauraine Rinne, NP (Nurse Practitioner)                 Patient reporting oxygen levels are running 95 to 97% on room air  Plan: Continue to monitor oxygen saturations at  home Patient would benefit from a walk at next in-person office visit    Assessment & Plan Note by Lauraine Rinne, NP at 01/27/2019 10:49 AM Author: Lauraine Rinne, NP Author Type: Nurse Practitioner Filed: 01/27/2019 10:56 AM  Note Status: [place]ell List: Cosign Not Required Encounter Date: 01/27/2019  Problem: 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 Lauraine Rinne, NP (Nurse Practitioner) at 01/27/2019 10:51 AM - Written      Plan:  Continue Symbicort 80  Needs walk with next ov  Needs pulmonary function test Nebulizer order today Okay to use albuterol/rescue inhaler as needed for shortness of breath or wheezing Continue to monitor oxygen levels at home and report to our office of your oxygen saturations are below 90%    Assessment & Plan Note by Lauraine Rinne, NP at 01/27/2019 10:47 AM Author: Lauraine Rinne, NP Author Type: Nurse Practitioner Filed: 01/27/2019 10:47 AM  Note Status: Written Cosign: Cosign Not Required Encounter Date: 01/27/2019  Problem: OSA (obstructive sleep apnea)  Editor: Lauraine Rinne, NP (Nurse Practitioner)                 Plan:  Continue CPAP therapy     Instructions    Return in about 3 months (around 04/27/2019), or if symptoms worsen or fail to improve, for Follow up with Dr. Vaughan Browner,  Follow up for PFT, With walk in office. You were seen today by Lauraine Rinne, NP  for:   1. 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  - The less you use it, the better it will work when you need it. -  Ok to use up to 2 puffs every 4 hours if you must but call for immediate appointment if use goes up over your usual need - Don't leave home without it !! (think of it like the spare tire for your car)   Order placed today for new nebulizer  Can also use albuterol nebulized meds every 6 hours as needed for shortness of breath or wheezing in place of your rescue inhaler  2. OSA (obstructive sleep apnea)  We recommend that you continue using your CPAP daily >>>Keep up the hard work using your device >>> Goal should be wearing this for the entire night that you are sleeping, at least 4 to 6 hours  Remember:   Do not drive or operate heavy machinery if tired or drowsy.   Please notify the supply company and office if you are unable to use your device regularly due to missing supplies or machine being broken.   Work on maintaining a healthy weight and following your recommended nutrition plan   Maintain proper daily exercise and movement   Maintaining proper use of your device can also help improve management of other chronic illnesses such as: Blood pressure, blood sugars, and weight management.   BiPAP/ CPAP Cleaning:  >>>Clean weekly, with Dawn soap, and bottle brush.  Set up to air dry. >>> Wipe mask out daily with wet wipe or towelette   3. Chronic respiratory failure, unspecified whether with hypoxia or hypercapnia ([name])  Continue to monitor oxygen saturations at home Notify our office of your oxygen saturations are below 90%  4. Restrictive lung disease  Obtain pulmonary function testing to compare to 2013  Continue to increase overall physical activity to reduce body weight and BMI   We recommend today:       Orders Placed This Encounter  Procedures   Ambulatory Referral for DME    Referral Priority:   Routine    Referral Type:   Durable Medical Equipment Purchase    Number of Visits Requested:   1      Orders Placed This Encounter    Procedures   Ambulatory Referral for DME       Meds ordered this encounter  Medications   albuterol (PROVENTIL) (2.5 MG/3ML) 0.083% nebulizer solution    Sig: Take 3 mLs (2.5 mg total) by nebulization every 6 (six) hours as needed for wheezing or shortness of breath.    Dispense:  360 mL    Refill:  2    Dx: J44.9    Follow Up:    Return in about 3 months (around 04/27/2019), or if symptoms worsen or fail to improve, for Follow up with Dr. Vaughan Browner, Follow up for PFT, With walk in office.   Please do your part to reduce  the spread of COVID-19:      Reduce your risk of any infection  and COVID19 by using the similar precautions used for avoiding the common cold or flu:  Wash your hands often with soap and warm water for at least 20 seconds.  If soap and water are not readily available, use an alcohol-based hand sanitizer with at least 60% alcohol.   If coughing or sneezing, cover your mouth and nose by coughing or sneezing into the elbow areas of your shirt or coat, into a tissue or into your sleeve (not your hands).  WEAR A MASK when in public   Avoid shaking hands with others and consider head nods or verbal greetings only.  Avoid touching your eyes,nose, or mouth with unwashed hands.  Avoid close contact with people who are sick.  Avoid places or events with large numbers of people in one location, like concerts or sporting events.  If you have some symptoms but not all symptoms, continue to monitor at home and seek medical attention if your symptoms worsen.  If you are having a medical emergency, call 911.   [place] / e-Visit: [email or web address]         [facility]: [facility]: [phone]                   [facility]: [phone]     It is flu season:   >>> Best ways to  protect herself from the flu: Receive the yearly flu vaccine, practice good hand hygiene washing with soap and also using hand sanitizer when available, eat a nutritious meals, get adequate rest, hydrate appropriately   Please contact the office if your symptoms worsen or you have concerns that you are not improving.   Thank you for choosing [facility] for your healthcare, and for allowing [place] to partner with you on your healthcare journey. I am thankful to be able to provide care to you today.   Wyn Quaker FNP-C        Was appointment offered to patient (explain)?  No, covid +   Reason for call: Tested + for covid 19 on 09/26/19, coughing up bloody sputum, ranges from bright red to brownish mucus, states she feels like she did when she had pna back in 2013.  She was scheduled to get her covid vaccinations, but was not feeling well and went to be tested and turned out positive.  She is currently on ASA 325, denies being on prednisone or any ibuprofen at this time.  (examples of things to ask: : When did symptoms start? Little over a week ago Fever? Ran a fever for 2-3 days up to 101, currently not running a fever Cough?yes  Productive?yes Color to sputum? Bloody, bright red to browning, ranges from a small amount to a medium amount, patient unable to be more specific in amount More sputum than usual?yes Wheezing? No Have you needed increased oxygen? No, not on oxygen Are you taking your respiratory medications?yes  What over the counter measures have you tried? thera flu and airborne.  Allergies  Allergen Reactions   Lisinopril Cough   Levaquin [Levofloxacin]     Nauseated     Immunization History  Administered Date(s) Administered   Influenza Split 10/26/2011, 11/05/2012   Influenza,inj,Quad PF,6+ Mos 12/29/2013   Pneumococcal Polysaccharide-23 05/08/2011

## 2019-09-30 ENCOUNTER — Inpatient Hospital Stay (HOSPITAL_COMMUNITY)
Admission: EM | Admit: 2019-09-30 | Discharge: 2019-10-05 | DRG: 177 | Disposition: A | Discharge status: Home Health | Payer: Medicaid Other | Attending: Family Medicine | Admitting: Family Medicine

## 2019-09-30 ENCOUNTER — Other Ambulatory Visit: Payer: Self-pay

## 2019-09-30 ENCOUNTER — Encounter (HOSPITAL_COMMUNITY): Payer: Self-pay

## 2019-09-30 ENCOUNTER — Emergency Department (HOSPITAL_COMMUNITY): Payer: Medicaid Other

## 2019-09-30 ENCOUNTER — Inpatient Hospital Stay (HOSPITAL_COMMUNITY): Payer: Medicaid Other

## 2019-09-30 DIAGNOSIS — R0602 Shortness of breath: Secondary | ICD-10-CM

## 2019-09-30 DIAGNOSIS — N189 Chronic kidney disease, unspecified: Secondary | ICD-10-CM | POA: Diagnosis present

## 2019-09-30 DIAGNOSIS — Z833 Family history of diabetes mellitus: Secondary | ICD-10-CM

## 2019-09-30 DIAGNOSIS — Z6841 Body Mass Index (BMI) 40.0 and over, adult: Secondary | ICD-10-CM

## 2019-09-30 DIAGNOSIS — R9431 Abnormal electrocardiogram [ECG] [EKG]: Secondary | ICD-10-CM

## 2019-09-30 DIAGNOSIS — Z7951 Long term (current) use of inhaled steroids: Secondary | ICD-10-CM | POA: Diagnosis not present

## 2019-09-30 DIAGNOSIS — J440 Chronic obstructive pulmonary disease with acute lower respiratory infection: Secondary | ICD-10-CM | POA: Diagnosis present

## 2019-09-30 DIAGNOSIS — Z881 Allergy status to other antibiotic agents status: Secondary | ICD-10-CM

## 2019-09-30 DIAGNOSIS — J9621 Acute and chronic respiratory failure with hypoxia: Secondary | ICD-10-CM | POA: Diagnosis present

## 2019-09-30 DIAGNOSIS — M797 Fibromyalgia: Secondary | ICD-10-CM | POA: Diagnosis present

## 2019-09-30 DIAGNOSIS — I5032 Chronic diastolic (congestive) heart failure: Secondary | ICD-10-CM | POA: Diagnosis present

## 2019-09-30 DIAGNOSIS — Z87891 Personal history of nicotine dependence: Secondary | ICD-10-CM

## 2019-09-30 DIAGNOSIS — J1282 Pneumonia due to coronavirus disease 2019: Secondary | ICD-10-CM

## 2019-09-30 DIAGNOSIS — U071 COVID-19: Principal | ICD-10-CM | POA: Diagnosis present

## 2019-09-30 DIAGNOSIS — R042 Hemoptysis: Secondary | ICD-10-CM | POA: Diagnosis present

## 2019-09-30 DIAGNOSIS — R0902 Hypoxemia: Secondary | ICD-10-CM

## 2019-09-30 DIAGNOSIS — Z79899 Other long term (current) drug therapy: Secondary | ICD-10-CM | POA: Diagnosis not present

## 2019-09-30 DIAGNOSIS — F319 Bipolar disorder, unspecified: Secondary | ICD-10-CM | POA: Diagnosis present

## 2019-09-30 DIAGNOSIS — I10 Essential (primary) hypertension: Secondary | ICD-10-CM | POA: Diagnosis present

## 2019-09-30 DIAGNOSIS — E1122 Type 2 diabetes mellitus with diabetic chronic kidney disease: Secondary | ICD-10-CM | POA: Diagnosis present

## 2019-09-30 DIAGNOSIS — Z888 Allergy status to other drugs, medicaments and biological substances status: Secondary | ICD-10-CM | POA: Diagnosis not present

## 2019-09-30 DIAGNOSIS — N179 Acute kidney failure, unspecified: Secondary | ICD-10-CM | POA: Diagnosis present

## 2019-09-30 DIAGNOSIS — G4733 Obstructive sleep apnea (adult) (pediatric): Secondary | ICD-10-CM | POA: Diagnosis present

## 2019-09-30 DIAGNOSIS — J449 Chronic obstructive pulmonary disease, unspecified: Secondary | ICD-10-CM | POA: Diagnosis present

## 2019-09-30 DIAGNOSIS — I130 Hypertensive heart and chronic kidney disease with heart failure and stage 1 through stage 4 chronic kidney disease, or unspecified chronic kidney disease: Secondary | ICD-10-CM | POA: Diagnosis present

## 2019-09-30 HISTORY — DX: Chronic kidney disease, unspecified: N18.9

## 2019-09-30 HISTORY — DX: Anxiety disorder, unspecified: F41.9

## 2019-09-30 HISTORY — DX: Depression, unspecified: F32.A

## 2019-09-30 HISTORY — DX: Dyspnea, unspecified: R06.00

## 2019-09-30 LAB — CBC
HCT: 45.9 % (ref 36.0–46.0)
Hemoglobin: 13.7 g/dL (ref 12.0–15.0)
MCH: 26.8 pg (ref 26.0–34.0)
MCHC: 29.8 g/dL — ABNORMAL LOW (ref 30.0–36.0)
MCV: 89.6 fL (ref 80.0–100.0)
Platelets: 175 10*3/uL (ref 150–400)
RBC: 5.12 MIL/uL — ABNORMAL HIGH (ref 3.87–5.11)
RDW: 14.2 % (ref 11.5–15.5)
WBC: 6.7 10*3/uL (ref 4.0–10.5)
nRBC: 0 % (ref 0.0–0.2)

## 2019-09-30 LAB — COMPREHENSIVE METABOLIC PANEL
ALT: 26 U/L (ref 0–44)
AST: 31 U/L (ref 15–41)
Albumin: 2.8 g/dL — ABNORMAL LOW (ref 3.5–5.0)
Alkaline Phosphatase: 51 U/L (ref 38–126)
Anion gap: 13 (ref 5–15)
BUN: 13 mg/dL (ref 6–20)
CO2: 24 mmol/L (ref 22–32)
Calcium: 7.7 mg/dL — ABNORMAL LOW (ref 8.9–10.3)
Chloride: 99 mmol/L (ref 98–111)
Creatinine, Ser: 1.39 mg/dL — ABNORMAL HIGH (ref 0.44–1.00)
GFR calc Af Amer: 49 mL/min — ABNORMAL LOW (ref >60)
GFR calc non Af Amer: 43 mL/min — ABNORMAL LOW (ref >60)
Glucose, Bld: 105 mg/dL — ABNORMAL HIGH (ref 70–99)
Potassium: 3.6 mmol/L (ref 3.5–5.1)
Sodium: 136 mmol/L (ref 135–145)
Total Bilirubin: 1.4 mg/dL — ABNORMAL HIGH (ref 0.3–1.2)
Total Protein: 6.2 g/dL — ABNORMAL LOW (ref 6.5–8.1)

## 2019-09-30 LAB — CBC WITH DIFFERENTIAL/PLATELET
Abs Immature Granulocytes: 0 10*3/uL (ref 0.00–0.07)
Basophils Absolute: 0 10*3/uL (ref 0.0–0.1)
Basophils Relative: 0 %
Eosinophils Absolute: 0.1 10*3/uL (ref 0.0–0.5)
Eosinophils Relative: 1 %
HCT: 47.6 % — ABNORMAL HIGH (ref 36.0–46.0)
Hemoglobin: 14.6 g/dL (ref 12.0–15.0)
Lymphocytes Relative: 17 %
Lymphs Abs: 1 10*3/uL (ref 0.7–4.0)
MCH: 27.8 pg (ref 26.0–34.0)
MCHC: 30.7 g/dL (ref 30.0–36.0)
MCV: 90.7 fL (ref 80.0–100.0)
Monocytes Absolute: 0.2 10*3/uL (ref 0.1–1.0)
Monocytes Relative: 3 %
Neutro Abs: 4.6 10*3/uL (ref 1.7–7.7)
Neutrophils Relative %: 79 %
Platelets: 193 10*3/uL (ref 150–400)
RBC: 5.25 MIL/uL — ABNORMAL HIGH (ref 3.87–5.11)
RDW: 14.5 % (ref 11.5–15.5)
WBC: 5.8 10*3/uL (ref 4.0–10.5)
nRBC: 0 % (ref 0.0–0.2)
nRBC: 0 /100 WBC

## 2019-09-30 LAB — BASIC METABOLIC PANEL
Anion gap: 11 (ref 5–15)
BUN: 12 mg/dL (ref 6–20)
CO2: 26 mmol/L (ref 22–32)
Calcium: 7.7 mg/dL — ABNORMAL LOW (ref 8.9–10.3)
Chloride: 100 mmol/L (ref 98–111)
Creatinine, Ser: 1.1 mg/dL — ABNORMAL HIGH (ref 0.44–1.00)
GFR calc Af Amer: >60 mL/min (ref >60)
GFR calc non Af Amer: 56 mL/min — ABNORMAL LOW (ref >60)
Glucose, Bld: 109 mg/dL — ABNORMAL HIGH (ref 70–99)
Potassium: 3.3 mmol/L — ABNORMAL LOW (ref 3.5–5.1)
Sodium: 137 mmol/L (ref 135–145)

## 2019-09-30 LAB — PROCALCITONIN: Procalcitonin: <0.1 ng/mL

## 2019-09-30 LAB — C-REACTIVE PROTEIN: CRP: 9.4 mg/dL — ABNORMAL HIGH (ref <1.0)

## 2019-09-30 LAB — TRIGLYCERIDES: Triglycerides: 171 mg/dL — ABNORMAL HIGH (ref <150)

## 2019-09-30 LAB — D-DIMER, QUANTITATIVE: D-Dimer, Quant: 2.04 ug/mL-FEU — ABNORMAL HIGH (ref 0.00–0.50)

## 2019-09-30 LAB — TROPONIN I (HIGH SENSITIVITY)
Troponin I (High Sensitivity): 19 ng/L — ABNORMAL HIGH (ref <18)
Troponin I (High Sensitivity): 19 ng/L — ABNORMAL HIGH (ref <18)

## 2019-09-30 LAB — SARS CORONAVIRUS 2 BY RT PCR (HOSPITAL ORDER, PERFORMED IN ~~LOC~~ HOSPITAL LAB): SARS Coronavirus 2: POSITIVE — AB

## 2019-09-30 LAB — ABO/RH: ABO/RH(D): O POS

## 2019-09-30 LAB — LACTATE DEHYDROGENASE: LDH: 422 U/L — ABNORMAL HIGH (ref 98–192)

## 2019-09-30 LAB — LACTIC ACID, PLASMA: Lactic Acid, Venous: 1.5 mmol/L (ref 0.5–1.9)

## 2019-09-30 LAB — FIBRINOGEN: Fibrinogen: 676 mg/dL — ABNORMAL HIGH (ref 210–475)

## 2019-09-30 LAB — FERRITIN: Ferritin: 314 ng/mL — ABNORMAL HIGH (ref 11–307)

## 2019-09-30 MED ORDER — OXYCODONE HCL 5 MG PO TABS
5.0000 mg | ORAL_TABLET | Freq: Four times a day (QID) | ORAL | Status: DC | PRN
  Administered 2019-10-02 – 2019-10-05 (×5): 5 mg via ORAL
  Filled 2019-09-30 (×5): qty 1

## 2019-09-30 MED ORDER — LACTATED RINGERS IV SOLN: INTRAVENOUS | Status: DC

## 2019-09-30 MED ORDER — METHYLPREDNISOLONE SODIUM SUCC 125 MG IJ SOLR
0.5000 mg/kg | Freq: Two times a day (BID) | INTRAMUSCULAR | Status: DC
  Administered 2019-09-30 – 2019-10-05 (×10): 111.25 mg via INTRAVENOUS
  Filled 2019-09-30 (×10): qty 2

## 2019-09-30 MED ORDER — IOHEXOL 350 MG/ML SOLN
100.0000 mL | Freq: Once | INTRAVENOUS | Status: AC | PRN
  Administered 2019-09-30: 100 mL via INTRAVENOUS

## 2019-09-30 MED ORDER — PROMETHAZINE HCL 25 MG/ML IJ SOLN: 12.5000 mg | Freq: Four times a day (QID) | INTRAMUSCULAR | Status: DC | PRN

## 2019-09-30 MED ORDER — SODIUM CHLORIDE 0.9 % IV SOLN
200.0000 mg | Freq: Once | INTRAVENOUS | Status: DC
  Filled 2019-09-30: qty 40

## 2019-09-30 MED ORDER — ASCORBIC ACID 500 MG PO TABS
500.0000 mg | ORAL_TABLET | Freq: Every day | ORAL | Status: DC
  Administered 2019-10-01 – 2019-10-05 (×5): 500 mg via ORAL
  Filled 2019-09-30 (×5): qty 1

## 2019-09-30 MED ORDER — MOMETASONE FURO-FORMOTEROL FUM 100-5 MCG/ACT IN AERO
2.0000 | INHALATION_SPRAY | Freq: Two times a day (BID) | RESPIRATORY_TRACT | Status: DC
  Administered 2019-10-01 – 2019-10-05 (×9): 2 via RESPIRATORY_TRACT
  Filled 2019-09-30 (×3): qty 8.8

## 2019-09-30 MED ORDER — ALPRAZOLAM 0.5 MG PO TABS
1.0000 mg | ORAL_TABLET | Freq: Two times a day (BID) | ORAL | Status: DC | PRN
  Administered 2019-10-01 – 2019-10-04 (×5): 1 mg via ORAL
  Filled 2019-09-30 (×2): qty 2
  Filled 2019-09-30 (×2): qty 4
  Filled 2019-09-30: qty 2

## 2019-09-30 MED ORDER — HYDROCOD POLST-CPM POLST ER 10-8 MG/5ML PO SUER
5.0000 mL | Freq: Two times a day (BID) | ORAL | Status: DC | PRN
  Administered 2019-10-04: 5 mL via ORAL
  Filled 2019-09-30: qty 5

## 2019-09-30 MED ORDER — FUROSEMIDE 80 MG PO TABS
80.0000 mg | ORAL_TABLET | Freq: Two times a day (BID) | ORAL | Status: DC
  Administered 2019-09-30 – 2019-10-01 (×3): 80 mg via ORAL
  Filled 2019-09-30 (×3): qty 4

## 2019-09-30 MED ORDER — ENOXAPARIN SODIUM 300 MG/3ML IJ SOLN
1.0000 mg/kg | Freq: Two times a day (BID) | INTRAMUSCULAR | Status: DC
  Administered 2019-10-01 – 2019-10-03 (×5): 220 mg via SUBCUTANEOUS
  Filled 2019-09-30 (×7): qty 2.2

## 2019-09-30 MED ORDER — SENNOSIDES-DOCUSATE SODIUM 8.6-50 MG PO TABS: 1.0000 | ORAL_TABLET | Freq: Every evening | ORAL | Status: DC | PRN

## 2019-09-30 MED ORDER — SODIUM CHLORIDE 0.9 % IV SOLN
100.0000 mg | Freq: Every day | INTRAVENOUS | Status: DC
  Filled 2019-09-30: qty 20

## 2019-09-30 MED ORDER — TRAZODONE HCL 50 MG PO TABS
100.0000 mg | ORAL_TABLET | Freq: Every evening | ORAL | Status: DC | PRN
  Administered 2019-10-02: 100 mg via ORAL
  Filled 2019-09-30: qty 2

## 2019-09-30 MED ORDER — METHYLPREDNISOLONE SODIUM SUCC 125 MG IJ SOLR
125.0000 mg | Freq: Once | INTRAMUSCULAR | Status: AC
  Administered 2019-09-30: 125 mg via INTRAVENOUS
  Filled 2019-09-30: qty 2

## 2019-09-30 MED ORDER — GUAIFENESIN-DM 100-10 MG/5ML PO SYRP
10.0000 mL | ORAL_SOLUTION | ORAL | Status: DC | PRN
  Administered 2019-10-03 – 2019-10-05 (×4): 10 mL via ORAL
  Filled 2019-09-30 (×4): qty 10

## 2019-09-30 MED ORDER — SODIUM CHLORIDE 0.9 % IV SOLN
100.0000 mg | Freq: Every day | INTRAVENOUS | Status: AC
  Administered 2019-10-01 – 2019-10-04 (×4): 100 mg via INTRAVENOUS
  Filled 2019-09-30: qty 100
  Filled 2019-09-30 (×5): qty 20

## 2019-09-30 MED ORDER — SODIUM CHLORIDE 0.9 % IV SOLN
200.0000 mg | Freq: Once | INTRAVENOUS | Status: AC
  Administered 2019-09-30: 200 mg via INTRAVENOUS
  Filled 2019-09-30: qty 40

## 2019-09-30 MED ORDER — ASPIRIN EC 81 MG PO TBEC
81.0000 mg | DELAYED_RELEASE_TABLET | Freq: Every day | ORAL | Status: DC
  Administered 2019-10-01 – 2019-10-05 (×5): 81 mg via ORAL
  Filled 2019-09-30 (×6): qty 1

## 2019-09-30 MED ORDER — IBUPROFEN 800 MG PO TABS: 800.0000 mg | ORAL_TABLET | Freq: Three times a day (TID) | ORAL | Status: DC | PRN

## 2019-09-30 MED ORDER — AMLODIPINE BESYLATE 10 MG PO TABS
10.0000 mg | ORAL_TABLET | Freq: Every day | ORAL | Status: DC
  Administered 2019-10-01 – 2019-10-05 (×5): 10 mg via ORAL
  Filled 2019-09-30 (×2): qty 1
  Filled 2019-09-30: qty 2
  Filled 2019-09-30 (×2): qty 1

## 2019-09-30 MED ORDER — ZINC SULFATE 220 (50 ZN) MG PO CAPS
220.0000 mg | ORAL_CAPSULE | Freq: Every day | ORAL | Status: DC
  Administered 2019-10-01 – 2019-10-05 (×5): 220 mg via ORAL
  Filled 2019-09-30 (×5): qty 1

## 2019-09-30 MED ORDER — ALBUTEROL SULFATE HFA 108 (90 BASE) MCG/ACT IN AERS
2.0000 | INHALATION_SPRAY | Freq: Four times a day (QID) | RESPIRATORY_TRACT | Status: DC | PRN
  Administered 2019-10-03: 2 via RESPIRATORY_TRACT
  Filled 2019-09-30: qty 6.7

## 2019-09-30 NOTE — ED Triage Notes (Signed)
Patient arrived by [facility] for hemoptysis and being diagnosed with Covid 8/16. Started antibiotics yesterday, speaking complete sentences and arrived with sats 89 RA. Patient in no distress

## 2019-09-30 NOTE — Progress Notes (Signed)
Patient refused ABG at this time. Patient "does not want to be in pain".

## 2019-09-30 NOTE — ED Provider Notes (Signed)
[facility] EMERGENCY DEPARTMENT Provider Note   CSN: [id number] Arrival date & time: 09/30/19  1032     History No chief complaint on file.   Jenna Chandler is a 54 y.o. female.  Pt complains of shortness of breath.  Pt has felt  bad since the 13th.  Pt had a positive covid test on the 16th.  Pt reports her Pulmonologist started her on antibiotics.  Pt reports she began coughing up some blood today.  Pt has a history of copd, chf and asthma.  Pt has been on 02 in the past but is not currently.  Pt uses a cpap at home.    The history is provided by the patient. No language interpreter was used.       Past Medical History:  Diagnosis Date  . Asthma   . Chronic diastolic CHF (congestive heart failure) ([place])   . COPD (chronic obstructive pulmonary disease) ([name])    +/- asthma   . Diabetes mellitus without complication ([place])   . Enlarged heart   . Fibromyalgia   . Hypertension   . Manic, depressive ([place])   . Morbid obesity ([place])   . OSA (obstructive sleep apnea)     Patient Active Problem List   Diagnosis Date Noted  . Restrictive lung disease 01/27/2019  . Fibromyalgia 04/30/2018  . SOB (shortness of breath) 05/05/2014  . Chest pain 05/05/2014  . Chronic respiratory failure ([place]) 04/26/2012  . OSA (obstructive sleep apnea) 06/02/2011  . Lethargy 06/02/2011  . Diastolic CHF, chronic ([place]) 06/01/2011  . 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 HTN (hypertension) 05/11/2011  . Morbid obesity ([place]) 05/11/2011  . Normocytic anemia 05/11/2011  . Manic, depressive ([place])     Past Surgical History:  Procedure Laterality Date  . NO PAST SURGERIES       OB History    Gravida  1   Para  1   Term  1   Preterm      AB      Living  1     SAB      TAB      Ectopic      Multiple      Live Births  1           Family History  Problem Relation Age of Onset  . Diabetes Father   . Cancer Mother   . Diabetes Mother      Social History   Tobacco Use  . Smoking status: Former Smoker    Packs/day: 2.00    Years: 15.00    Pack years: 30.00    Types: Cigarettes    Quit date: 05/24/2002    Years since quitting: 17.3  . Smokeless tobacco: Never Used  Vaping Use  . Vaping Use: Never used  Substance Use Topics  . Alcohol use: No  . Drug use: No    Home Medications Prior to Admission medications   Medication Sig Start Date End Date Taking? Authorizing Provider  albuterol (PROVENTIL HFA;VENTOLIN HFA) 108 (90 BASE) MCG/ACT inhaler Inhale 2 puffs into the lungs every 6 (six) hours as needed. wheezing 12/29/13   Elsie Stain, MD  albuterol (PROVENTIL) (2.5 MG/3ML) 0.083% nebulizer solution Take 3 mLs (2.5 mg total) by nebulization every 6 (six) hours as needed for wheezing or shortness of breath. 01/27/19   Lauraine Rinne, NP  ALPRAZolam Duanne Moron) 1 MG tablet TK 1 T PO QD PRA 08/29/18  [provider]  amLODipine (NORVASC) 10 MG tablet TAKE 1 TABLET(10 MG) BY MOUTH TWICE DAILY 12/26/17   Turner, Eber Hong, MD  azithromycin (ZITHROMAX) 250 MG tablet Take 1 tablet (250 mg total) by mouth daily. 09/18/18   Mannam, Hart Robinsons, MD  budesonide-formoterol (SYMBICORT) 80-4.5 MCG/ACT inhaler Inhale 2 puffs into the lungs 2 (two) times daily. 09/18/18   Mannam, Hart Robinsons, MD  cloNIDine (CATAPRES) 0.2 MG tablet Take 1 tablet (0.2 mg total) by mouth 2 (two) times daily. 09/10/17   Sueanne Margarita, MD  cyclobenzaprine (FLEXERIL) 10 MG tablet Take 1 tablet (10 mg total) by mouth 2 (two) times daily as needed for muscle spasms. 08/26/13   Kirichenko, Lahoma Rocker, PA-C  doxycycline (VIBRA-TABS) 100 MG tablet Take 1 tablet (100 mg total) by mouth 2 (two) times daily. 01/24/19   Lauraine Rinne, NP  doxycycline (VIBRA-TABS) 100 MG tablet Take 1 tablet (100 mg total) by mouth 2 (two) times daily. 06/19/19   Mannam, Hart Robinsons, MD  furosemide (LASIX) 40 MG tablet Take 80 mg by mouth 2 (two) times daily. 05/18/11   Black, Lezlie Octave, NP   gabapentin (NEURONTIN) 300 MG capsule Take 300 mg by mouth 2 (two) times daily. 800mg  09/12/16   [provider]  ibuprofen (ADVIL,MOTRIN) 800 MG tablet Take 800 mg by mouth every 6 (six) hours as needed. AS NEEDED FOR MODERATE PAIN 12/22/14   [provider]  losartan (COZAAR) 50 MG tablet Take 1 tablet (50 mg total) by mouth daily. Patient taking differently: Take 50 mg by mouth daily. 100mg . 11/12/17 11/12/18  Sueanne Margarita, MD  potassium chloride SA (K-DUR,KLOR-CON) 20 MEQ tablet Take 1 tablet (20 mEq total) by mouth daily. 12/04/17 12/04/18  Sueanne Margarita, MD  predniSONE (DELTASONE) 10 MG tablet Take 2 tablets (20 mg total) by mouth daily with breakfast. 01/24/19   Lauraine Rinne, NP  predniSONE (DELTASONE) 10 MG tablet 40mg X2 days, 30mg  X2 days, 20mg  X2 days, 10mg X2 days, then stop. 06/19/19   Marshell Garfinkel, MD  traZODone (DESYREL) 100 MG tablet TAKE 1 TABLET(100 MG) BY MOUTH AT BEDTIME AS NEEDED FOR SLEEP 11/04/18   Martyn Ehrich, NP    Allergies    Lisinopril and Levaquin [levofloxacin]  Review of Systems   Review of Systems  All other systems reviewed and are negative.   Physical Exam Updated Vital Signs BP (!) 133/93   Pulse 89   Temp 98.4 F (36.9 C)   Resp 20   Ht 5\' 4"  (1.626 m)   Wt (!) 222.3 kg   SpO2 95%   BMI 84.11 kg/m   Physical Exam Vitals and nursing note reviewed.  Constitutional:      Appearance: She is well-developed. She is obese.  HENT:     Head: Normocephalic.  Cardiovascular:     Rate and Rhythm: Normal rate and regular rhythm.  Pulmonary:     Effort: Pulmonary effort is normal.     Breath sounds: Normal breath sounds.  Abdominal:     General: There is no distension.  Musculoskeletal:        General: Normal range of motion.     Cervical back: Normal range of motion.  Skin:    General: Skin is warm.  Neurological:     General: No focal deficit present.     Mental Status: She is alert and oriented to person, place,  and time.  Psychiatric:        Mood and Affect: Mood normal.  ED Results / Procedures / Treatments   Labs (all labs ordered are listed, but only abnormal results are displayed) Labs Reviewed  BASIC METABOLIC PANEL - Abnormal; Notable for the following components:      Result Value   Potassium 3.3 (*)    Glucose, Bld 109 (*)    Creatinine, Ser 1.10 (*)    Calcium 7.7 (*)    GFR calc non Af Amer 56 (*)    All other components within normal limits  CBC - Abnormal; Notable for the following components:   RBC 5.12 (*)    MCHC 29.8 (*)    All other components within normal limits  TROPONIN I (HIGH SENSITIVITY) - Abnormal; Notable for the following components:   Troponin I (High Sensitivity) 19 (*)    All other components within normal limits  SARS CORONAVIRUS 2 BY RT PCR (HOSPITAL ORDER, [facility])  CULTURE, BLOOD (ROUTINE X 2)  CULTURE, BLOOD (ROUTINE X 2)  LACTIC ACID, PLASMA  LACTIC ACID, PLASMA  CBC WITH DIFFERENTIAL/PLATELET  COMPREHENSIVE METABOLIC PANEL  D-DIMER, QUANTITATIVE (NOT AT [facility])  PROCALCITONIN  LACTATE DEHYDROGENASE  FERRITIN  TRIGLYCERIDES  FIBRINOGEN  C-REACTIVE PROTEIN  I-STAT BETA HCG BLOOD, ED ([facility], [facility], [facility] ONLY)  TROPONIN I (HIGH SENSITIVITY)    EKG None  Radiology DG Chest Port 1 View  Result Date: 09/30/2019 CLINICAL DATA:  Shortness of breath.  COVID positive. EXAM: PORTABLE CHEST 1 VIEW COMPARISON:  Radiograph 10/10/2017 FINDINGS: Low lung volumes. Patchy heterogeneous bilateral airspace opacities in a mid-lower lung zone predominant distribution. Borderline mild cardiomegaly. No pneumothorax or large pleural effusion. Technically limited assessment of the lung bases due to portable technique and soft tissue attenuation from habitus. IMPRESSION: Patchy heterogeneous bilateral airspace opacities in a mid-lower lung zone predominant distribution, most consistent with COVID-19 pneumonia. Electronically Signed   By:  Keith Rake M.D.   On: 09/30/2019 17:21    Procedures Procedures (including critical care time)  Medications Ordered in ED Medications - No data to display  ED Course  I have reviewed the triage vital signs and the nursing notes.  Pertinent labs & imaging results that were available during my care of the patient were reviewed by me and considered in my medical decision making (see chart for details).    MDM Rules/Calculators/A&P                          MDM:  )2 at triage was 89%. Chest xray shows covid pneumonia.   Rn reports pt coughed up blood   Hospitalist consulted to admit.  Final Clinical Impression(s) / ED Diagnoses Final diagnoses:  Pneumonia due to COVID-19 virus    Rx / DC Orders ED Discharge Orders    None       Sidney Ace 09/30/19 1921    Truddie Hidden, MD 09/30/19 2158

## 2019-09-30 NOTE — H&P (Signed)
History and Physical    Jenna Chandler VZD:[id number] DOB: Aug 04, 1965 DOA: 09/30/2019  PCP: Nolene Ebbs, MD   Patient coming from: Home  Chief Complaint: SOB, cough with blood tinged sputum. Covid positive  HPI: Jenna Chandler is a 54 y.o. female with medical history significant for COPD/asthma, hypertension, chronic diastolic CHF, depression/anxiety, OSA, morbid obesity who presents to the hospital complaining of shortness of breath with cough.  She reports she has been feeling bad since August 13.  She was tested for Covid at [facility] on 16 August and was told she was positive on 18 August.  She has not been treated with any antiviral or monoclonal antibodies as an outpatient.  She was seen by her pulmonologist yesterday and was prescribed an antibiotic.  Not sure which one.  Today she had some hard coughing with some mild blood-tinged sputum.  Had no gross hemoptysis.  She has had subjective fever and chills.  Denies any nausea, vomiting or diarrhea.  She does have generalized body aches.  She does have a history of fibromyalgia but body aches seem more pronounced than her normal.  She used Tylenol and Motrin at home but with her worsening shortness of breath she came to the emergency room. Her brother and his wife have been living with her for the last week in the day they moved and they started coughing she thinks they were diagnosed with Covid a few days ago.  She does not use oxygen at home.  She does have a history of OSA and uses CPAP at night. History of smoking but quit in 2014.  She denies any alcohol or illicit drug use.  ED Course:   Patient is found to be hypoxic with O2 sats in the mid 80s on room air.  She does not use oxygen at home and is now requiring oxygen by nasal cannula to maintain O2 sats above 92%.  She has elevated D-dimer, ferritin, CRP.  Chest x-ray is consistent with Covid pneumonia. CT angiography has been ordered in the emergency room and is pending  with her having Covid, which is a prothrombotic condition and having a elevated D-dimer as will be important to know she has a PE to adequately anticoagulate long term or not.   Review of Systems:  General: Reports generalized weakness. Reports subjective fever, chills. No weight loss, night sweats.  Denies dizziness.  Denies change in appetite HENT: Denies head trauma, headache, denies change in hearing, tinnitus.  Denies nasal congestion or bleeding.  Denies sore throat, sores in mouth.  Denies difficulty swallowing Eyes: Denies blurry vision, pain in eye, drainage.  Denies discoloration of eyes. Neck: Denies pain.  Denies swelling.  Denies pain with movement. Cardiovascular: Denies chest pain, palpitations.  Denies edema.  Denies orthopnea Respiratory: Reports shortness of breath with cough. Reports wheezing. Has scant production of blood-tinged this morning Gastrointestinal: Denies abdominal pain, swelling.  Denies nausea, vomiting, diarrhea.  Denies melena.  Denies hematemesis. Musculoskeletal: Denies limitation of movement.  Denies deformity or swelling.  Denies pain.  Denies arthralgias or myalgias. Genitourinary: Denies pelvic pain.  Denies urinary frequency or hesitancy.  Denies dysuria.  Skin: Denies rash.  Denies petechiae, purpura, ecchymosis. Neurological: Denies headache.  Denies syncope.  Denies seizure activity.  Denies weakness or paresthesia.  Denies slurred speech, drooping face.  Denies visual change. Psychiatric: Denies depression, anxiety.  Denies suicidal thoughts or ideation.  Denies hallucinations.  Past Medical History:  Diagnosis Date  . Anxiety   . Asthma   .  Chronic diastolic CHF (congestive heart failure) ([place])   . Chronic kidney disease   . COPD (chronic obstructive pulmonary disease) ([name])    +/- asthma   . Depression   . Diabetes mellitus without complication ([place])   . Dyspnea   . Enlarged heart   . Fibromyalgia   . Hypertension   . Manic, depressive  ([place])   . Morbid obesity ([place])   . OSA (obstructive sleep apnea)     Past Surgical History:  Procedure Laterality Date  . NO PAST SURGERIES      Social History  reports that she quit smoking about 17 years ago. Her smoking use included cigarettes. She has a 30.00 pack-year smoking history. She has never used smokeless tobacco. She reports that she does not drink alcohol and does not use drugs.  Allergies  Allergen Reactions  . Lisinopril Cough  . Levaquin [Levofloxacin]     Nauseated     Family History  Problem Relation Age of Onset  . Diabetes Father   . Cancer Mother   . Diabetes Mother      Prior to Admission medications   Medication Sig Start Date End Date Taking? Authorizing Provider  albuterol (PROVENTIL HFA;VENTOLIN HFA) 108 (90 BASE) MCG/ACT inhaler Inhale 2 puffs into the lungs every 6 (six) hours as needed. wheezing 12/29/13   Elsie Stain, MD  albuterol (PROVENTIL) (2.5 MG/3ML) 0.083% nebulizer solution Take 3 mLs (2.5 mg total) by nebulization every 6 (six) hours as needed for wheezing or shortness of breath. 01/27/19   Lauraine Rinne, NP  ALPRAZolam Duanne Moron) 1 MG tablet TK 1 T PO QD PRA 08/29/18   [provider]  amLODipine (NORVASC) 10 MG tablet TAKE 1 TABLET(10 MG) BY MOUTH TWICE DAILY 12/26/17   Sueanne Margarita, MD  azithromycin (ZITHROMAX) 250 MG tablet Take 1 tablet (250 mg total) by mouth daily. 09/18/18   Mannam, Hart Robinsons, MD  budesonide-formoterol (SYMBICORT) 80-4.5 MCG/ACT inhaler Inhale 2 puffs into the lungs 2 (two) times daily. 09/18/18   Mannam, Hart Robinsons, MD  cloNIDine (CATAPRES) 0.2 MG tablet Take 1 tablet (0.2 mg total) by mouth 2 (two) times daily. 09/10/17   Sueanne Margarita, MD  cyclobenzaprine (FLEXERIL) 10 MG tablet Take 1 tablet (10 mg total) by mouth 2 (two) times daily as needed for muscle spasms. 08/26/13   Kirichenko, Lahoma Rocker, PA-C  doxycycline (VIBRA-TABS) 100 MG tablet Take 1 tablet (100 mg total) by mouth 2 (two) times daily. 01/24/19    Lauraine Rinne, NP  doxycycline (VIBRA-TABS) 100 MG tablet Take 1 tablet (100 mg total) by mouth 2 (two) times daily. 06/19/19   Mannam, Hart Robinsons, MD  furosemide (LASIX) 40 MG tablet Take 80 mg by mouth 2 (two) times daily. 05/18/11   Black, Lezlie Octave, NP  gabapentin (NEURONTIN) 300 MG capsule Take 300 mg by mouth 2 (two) times daily. 800mg  09/12/16   [provider]  ibuprofen (ADVIL,MOTRIN) 800 MG tablet Take 800 mg by mouth every 6 (six) hours as needed. AS NEEDED FOR MODERATE PAIN 12/22/14   [provider]  losartan (COZAAR) 50 MG tablet Take 1 tablet (50 mg total) by mouth daily. Patient taking differently: Take 50 mg by mouth daily. 100mg . 11/12/17 11/12/18  Sueanne Margarita, MD  potassium chloride SA (K-DUR,KLOR-CON) 20 MEQ tablet Take 1 tablet (20 mEq total) by mouth daily. 12/04/17 12/04/18  Sueanne Margarita, MD  predniSONE (DELTASONE) 10 MG tablet Take 2 tablets (20 mg total) by mouth daily with breakfast.  01/24/19   Lauraine Rinne, NP  predniSONE (DELTASONE) 10 MG tablet 40mg X2 days, 30mg  X2 days, 20mg  X2 days, 10mg X2 days, then stop. 06/19/19   Marshell Garfinkel, MD  traZODone (DESYREL) 100 MG tablet TAKE 1 TABLET(100 MG) BY MOUTH AT BEDTIME AS NEEDED FOR SLEEP 11/04/18   Martyn Ehrich, NP    Physical Exam: Vitals:   09/30/19 1615 09/30/19 1715 09/30/19 1900 09/30/19 1930  BP: (!) 133/93 (!) 167/93 137/73 131/85  Pulse: 89 99 89 91  Resp: 20 20 (!) 26 (!) 32  Temp: 98.4 F (36.9 C)     TempSrc:      SpO2: 95% 95% 98% 97%  Weight:      Height:        Constitutional: NAD, calm, comfortable Vitals:   09/30/19 1615 09/30/19 1715 09/30/19 1900 09/30/19 1930  BP: (!) 133/93 (!) 167/93 137/73 131/85  Pulse: 89 99 89 91  Resp: 20 20 (!) 26 (!) 32  Temp: 98.4 F (36.9 C)     TempSrc:      SpO2: 95% 95% 98% 97%  Weight:      Height:       General: WDWN, Alert and oriented x3. Morbidly obese. Eyes: EOMI, PERRL, lids and conjunctivae normal.  Sclera nonicteric HENT:   [place]/AT, external ears normal.  Nares patent without epistasis.  Mucous membranes are moist. Posterior pharynx clear of any exudate or lesions. Neck: Soft, normal range of motion, obese. supple, no masses, no thyromegaly.  Trachea midline Respiratory: clear to auscultation bilaterally, no wheezing, no crackles. Normal respiratory effort. No accessory muscle use.  Cardiovascular: Regular rate and rhythm, no murmurs / rubs / gallops. Mild lower extremity edema. 1+ pedal pulses.  Abdomen: Soft, no tenderness, nondistended, morbidly obese. no rebound or guarding.  No masses palpated. Bowel sounds normoactive Musculoskeletal: FROM. Has clubbing of digits. No cyanosis. No joint deformity upper and lower extremities. no contractures. Normal muscle tone.  Skin: Warm, dry, intact no rashes, lesions, ulcers. No induration Neurologic: CN 2-12 grossly intact.  Normal speech.  Sensation intact.  Strength 5/5 in all extremities.   Psychiatric: Normal judgment and insight.  Normal mood.    Labs on Admission: I have personally reviewed following labs and imaging studies  CBC: Recent Labs  Lab 09/30/19 1114 09/30/19 1745  WBC 6.7 5.8  NEUTROABS  --  4.6  HGB 13.7 14.6  HCT 45.9 47.6*  MCV 89.6 90.7  PLT 175 017    Basic Metabolic Panel: Recent Labs  Lab 09/30/19 1114 09/30/19 1745  NA 137 136  K 3.3* 3.6  CL 100 99  CO2 26 24  GLUCOSE 109* 105*  BUN 12 13  CREATININE 1.10* 1.39*  CALCIUM 7.7* 7.7*    GFR: Estimated Creatinine Clearance: 87.9 mL/min (A) (by C-G formula based on SCr of 1.39 mg/dL (H)).  Liver Function Tests: Recent Labs  Lab 09/30/19 1745  AST 31  ALT 26  ALKPHOS 51  BILITOT 1.4*  PROT 6.2*  ALBUMIN 2.8*    Urine analysis:    Component Value Date/Time   COLORURINE YELLOW 07/06/2017 2059   APPEARANCEUR CLEAR 07/06/2017 2059   LABSPEC 1.013 07/06/2017 2059   PHURINE 6.0 07/06/2017 2059   GLUCOSEU NEGATIVE 07/06/2017 2059   HGBUR NEGATIVE 07/06/2017 2059    BILIRUBINUR NEGATIVE 07/06/2017 2059   [place] NEGATIVE 07/06/2017 2059   PROTEINUR 30 (A) 07/06/2017 2059   UROBILINOGEN 0.2 03/19/2013 2014   NITRITE NEGATIVE 07/06/2017 2059   LEUKOCYTESUR NEGATIVE  07/06/2017 2059    Radiological Exams on Admission: DG Chest Port 1 View  Result Date: 09/30/2019 CLINICAL DATA:  Shortness of breath.  COVID positive. EXAM: PORTABLE CHEST 1 VIEW COMPARISON:  Radiograph 10/10/2017 FINDINGS: Low lung volumes. Patchy heterogeneous bilateral airspace opacities in a mid-lower lung zone predominant distribution. Borderline mild cardiomegaly. No pneumothorax or large pleural effusion. Technically limited assessment of the lung bases due to portable technique and soft tissue attenuation from habitus. IMPRESSION: Patchy heterogeneous bilateral airspace opacities in a mid-lower lung zone predominant distribution, most consistent with COVID-19 pneumonia. Electronically Signed   By: Keith Rake M.D.   On: 09/30/2019 17:21    EKG: Independently reviewed.  Normal sinus rhythm.  QTc prolonged at 516  Assessment/Plan Principal Problem:   Pneumonia due to COVID-19 virus Jenna Chandler is admitted to [place] floor on Covid precautions.  She is started on remdesivir and Solu-Medrol. Pulmonal oxygen as needed.  Incentive spirometer every 2 hours while awake. Covid labs were monitored every morning.  Patient does have an elevated D-dimer ferritin level. CT angiography of chest has been ordered to further evaluate for PE with patient having hypoxia with hemoptysis.  Not be able to have CT angiography secondary to body habitus and if not will start therapeutic Lovenox for suspected PE.  If cannot get CT angiography will get VQ scan in the morning Gentle IV fluid hydration with LR at 100 mL's per hour overnight.  Monitor fluid status  Active Problems:   Hypoxia Supplemental  oxygen as needed to keep O2 sat between 92 to 96%. Incentive spirometer every 2 hours while  awake.     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  Will use Dulera Hailer in place of her home Symbicort due to formulary.  Albuterol MDI as needed for shortness of breath, cough, wheeze every 6 hours    HTN (hypertension) Home antihypertensives will be reconciled by pharmacy and then resumed.  Monitor blood pressure.    Diastolic CHF, chronic ([name]) Continue home Lasix dose.  Home medications to be reconciled by pharmacy and resume.    Prolonged QT interval Prolonged QTC and will avoid any medications that may prolong further.      AKI (acute kidney injury) [facility]) Patient with mild AKI.  Gentle IV fluid hydration overnight. Check electrolytes renal function morning.    Morbid obesity ([place]) Chronic morbid obesity.  Patient to follow-up with PCP for dietary lifestyle interventions for weight loss.  Patient may also benefit from referral to bariatric surgery for evaluation    OSA (obstructive sleep apnea) Continue home as needed for OSA.  RT to follow    DVT prophylaxis: She is placed on Lovenox 1 mg/kg twice daily for her therapeutic anticoagulation with elevated D-dimer and suspected PE with Covid. Code Status:   Full code Family Communication:  Diagnosis and plan discussed with patient.  Patient verbalized understanding and agrees with plan.  Further recommendations to follow as clinically indicated. Disposition Plan:   Patient is from:  Home  Anticipated DC to:  Home  Anticipated DC date:  Anticipate greater than 2 midnight stay in the hospital to treat acute medical condition  Anticipated DC barriers: No barriers to discharge identified at this time  Admission status:  Inpatient  Severity of Illness: The appropriate patient status for this patient is INPATIENT. Inpatient status is judged to be reasonable and necessary in order to provide the required intensity of service to ensure the patient's safety. The patient's  presenting symptoms, physical  exam findings, and initial radiographic and laboratory data in the context of their chronic comorbidities is felt to place them at high risk for further clinical deterioration. Furthermore, it is not anticipated that the patient will be medically stable for discharge from the hospital within 2 midnights of admission. The following factors support the patient status of inpatient.    * I certify that at the point of admission it is my clinical judgment that the patient will require inpatient hospital care spanning beyond 2 midnights from the point of admission due to high intensity of service, high risk for further deterioration and high frequency of surveillance required.Yevonne Aline Jeffree Cazeau MD [facility]  How to contact the [facility] Attending or Consulting provider [place] or covering provider during after hours [place], for this patient?   1. Check the care team in [facility] and look for a) attending/consulting [facility] provider listed and b) the [facility] team listed 2. Log into [email or web address] and use [place]'s universal password to access. If you do not have the password, please contact the hospital operator. 3. Locate the [facility] provider you are looking for under [facility] and page to a number that you can be directly reached. 4. If you still have difficulty reaching the provider, please page the [facility] (Director on Call) for the Hospitalists listed on amion for assistance.  09/30/2019, 8:02 PM

## 2019-10-01 ENCOUNTER — Inpatient Hospital Stay (HOSPITAL_COMMUNITY): Payer: Medicaid Other

## 2019-10-01 LAB — CBC WITH DIFFERENTIAL/PLATELET
Abs Immature Granulocytes: 0.02 10*3/uL (ref 0.00–0.07)
Basophils Absolute: 0 10*3/uL (ref 0.0–0.1)
Basophils Relative: 0 %
Eosinophils Absolute: 0 10*3/uL (ref 0.0–0.5)
Eosinophils Relative: 0 %
HCT: 46.6 % — ABNORMAL HIGH (ref 36.0–46.0)
Hemoglobin: 14.2 g/dL (ref 12.0–15.0)
Immature Granulocytes: 0 %
Lymphocytes Relative: 12 %
Lymphs Abs: 0.6 10*3/uL — ABNORMAL LOW (ref 0.7–4.0)
MCH: 27.9 pg (ref 26.0–34.0)
MCHC: 30.5 g/dL (ref 30.0–36.0)
MCV: 91.6 fL (ref 80.0–100.0)
Monocytes Absolute: 0.1 10*3/uL (ref 0.1–1.0)
Monocytes Relative: 3 %
Neutro Abs: 4.4 10*3/uL (ref 1.7–7.7)
Neutrophils Relative %: 85 %
Platelets: 205 10*3/uL (ref 150–400)
RBC: 5.09 MIL/uL (ref 3.87–5.11)
RDW: 14.6 % (ref 11.5–15.5)
WBC: 5.2 10*3/uL (ref 4.0–10.5)
nRBC: 0 % (ref 0.0–0.2)

## 2019-10-01 LAB — HIV ANTIBODY (ROUTINE TESTING W REFLEX): HIV Screen 4th Generation wRfx: NONREACTIVE

## 2019-10-01 LAB — COMPREHENSIVE METABOLIC PANEL
ALT: 26 U/L (ref 0–44)
AST: 24 U/L (ref 15–41)
Albumin: 2.7 g/dL — ABNORMAL LOW (ref 3.5–5.0)
Alkaline Phosphatase: 54 U/L (ref 38–126)
Anion gap: 14 (ref 5–15)
BUN: 17 mg/dL (ref 6–20)
CO2: 23 mmol/L (ref 22–32)
Calcium: 7.8 mg/dL — ABNORMAL LOW (ref 8.9–10.3)
Chloride: 98 mmol/L (ref 98–111)
Creatinine, Ser: 1.19 mg/dL — ABNORMAL HIGH (ref 0.44–1.00)
GFR calc Af Amer: 60 mL/min — ABNORMAL LOW (ref >60)
GFR calc non Af Amer: 51 mL/min — ABNORMAL LOW (ref >60)
Glucose, Bld: 142 mg/dL — ABNORMAL HIGH (ref 70–99)
Potassium: 3.6 mmol/L (ref 3.5–5.1)
Sodium: 135 mmol/L (ref 135–145)
Total Bilirubin: 0.7 mg/dL (ref 0.3–1.2)
Total Protein: 6.4 g/dL — ABNORMAL LOW (ref 6.5–8.1)

## 2019-10-01 LAB — C-REACTIVE PROTEIN: CRP: 11.2 mg/dL — ABNORMAL HIGH (ref <1.0)

## 2019-10-01 LAB — FERRITIN: Ferritin: 361 ng/mL — ABNORMAL HIGH (ref 11–307)

## 2019-10-01 MED ORDER — CLONIDINE HCL 0.2 MG PO TABS
0.2000 mg | ORAL_TABLET | Freq: Two times a day (BID) | ORAL | Status: DC
  Administered 2019-10-01 – 2019-10-05 (×8): 0.2 mg via ORAL
  Filled 2019-10-01 (×8): qty 1

## 2019-10-01 NOTE — Progress Notes (Signed)
Bilateral lev completed .  See CVProc for prelim results.

## 2019-10-01 NOTE — ED Notes (Signed)
Paged on call to notify that [facility] was unable to complete VQ scan

## 2019-10-01 NOTE — ED Notes (Signed)
Pt back in ED

## 2019-10-01 NOTE — Progress Notes (Signed)
PROGRESS NOTE    Jenna Chandler  MWU:[id number] DOB: 28-Mar-1965 DOA: 09/30/2019 PCP: Nolene Ebbs, MD   No chief complaint on file.   Brief Narrative:  Jenna Chandler is Jenna Chandler 54 y.o. female with medical history significant for COPD/asthma, hypertension, chronic diastolic CHF, depression/anxiety, OSA, morbid obesity who presents to the hospital complaining of shortness of breath with cough.  She reports she has been feeling bad since August 13.  She was tested for Covid at [facility] on 16 August and was told she was positive on 18 August.  She has not been treated with any antiviral or monoclonal antibodies as an outpatient.  She was seen by her pulmonologist yesterday and was prescribed an antibiotic.  Not sure which one.  Today she had some hard coughing with some mild blood-tinged sputum.  Had no gross hemoptysis.  She has had subjective fever and chills.  Denies any nausea, vomiting or diarrhea.  She does have generalized body aches.  She does have Jenna Chandler history of fibromyalgia but body aches seem more pronounced than her normal.  She used Tylenol and Motrin at home but with her worsening shortness of breath she came to the emergency room. Her brother and his wife have been living with her for the last week in the day they moved and they started coughing she thinks they were diagnosed with Covid Ravon Mortellaro few days ago.  She does not use oxygen at home.  She does have Jenna Chandler history of OSA and uses CPAP at night. History of smoking but quit in 2014.  She denies any alcohol or illicit drug use.  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   HTN (hypertension)   Morbid obesity ([name])   Diastolic CHF, chronic ([name])   OSA (obstructive sleep apnea)   Hypoxia   Prolonged QT interval   AKI (acute kidney injury) ([place])  Acute Hypoxic Respiratory Failure 2/2 COVID 19 Pneumonia Unvaccinated  CXR with patchy  herterogenous bilateral airspace opacities in mid lower lung zone predominant distribution CT PE protocol, limited exam, no obvious large or central PE, multifocal pneumonia Satting 98% on 2 L [place] Continue remdesivir and solumedrol  I/O, daily weights Elevated inflammatory markers, will trend OOB, prone as able, IS, flutter, PT  COVID-19 Labs  Recent Labs    09/30/19 1745 10/01/19 0457  DDIMER 2.04*  --   FERRITIN 314* 361*  LDH 422*  --   CRP 9.4* 11.2*    Lab Results  Component Value Date   SARSCOV2NAA POSITIVE (Taner Rzepka) 09/30/2019   Hemopytsis  Elevated D dimer: currently on full dose anticoagulation with concern for PE, CT not adequate to r/o PE.  Follow LE [place].  Consider V/Q scan if negative.  Hemoptysis seems to be improving.    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  Holding HCTZ/arb with AKI.  Diastolic CHF, chronic ([name]) Continue home Lasix dose.   Prolonged QT interval QTc 516 Repeat EKG today Continue to monitor, caution with QT prolonging meds   AKI (acute kidney injury) ([place]) Improved, continue to monitor  Morbid obesity (Jenna Chandler) Chronic morbid obesity.  Patient to follow-up with PCP for dietary lifestyle interventions for weight loss.  Patient may also benefit from referral to bariatric surgery for evaluation  OSA (obstructive sleep apnea) Continue home as needed for OSA.  RT to follow  DVT prophylaxis: full dose lovenox Code Status: full  Family Communication: none at bedside Disposition:   Status is: Inpatient  Remains inpatient appropriate because:Inpatient level of care appropriate due to severity of illness   Dispo: The patient is from: Home              Anticipated d/c is to: Home              Anticipated d/c date is: > 3 days              Patient currently is not medically stable to d/c.       Consultants:    none  Procedures: none  Antimicrobials:  Anti-infectives (From admission, onward)   Start     Dose/Rate Route Frequency Ordered Stop   10/01/19 1000  remdesivir 100 mg in sodium chloride 0.9 % 100 mL IVPB       "Followed by" Linked Group Details   100 mg 200 mL/hr over 30 Minutes Intravenous Daily 09/30/19 1954 10/05/19 0959   10/01/19 1000  remdesivir 100 mg in sodium chloride 0.9 % 100 mL IVPB  Status:  Discontinued       "Followed by" Linked Group Details   100 mg 200 mL/hr over 30 Minutes Intravenous Daily 09/30/19 2300 10/01/19 0028   09/30/19 2300  remdesivir 200 mg in sodium chloride 0.9% 250 mL IVPB  Status:  Discontinued       "Followed by" Linked Group Details   200 mg 580 mL/hr over 30 Minutes Intravenous Once 09/30/19 2300 10/01/19 0028   09/30/19 2030  remdesivir 200 mg in sodium chloride 0.9% 250 mL IVPB       "Followed by" Linked Group Details   200 mg 580 mL/hr over 30 Minutes Intravenous Once 09/30/19 1954 09/30/19 2143         Subjective: Notes improved blood in sputum Denies SOB  Objective: Vitals:   10/01/19 1100 10/01/19 1149 10/01/19 1200 10/01/19 1233  BP:  (!) 135/92 132/89   Pulse: 81 83 76   Resp: (!) 26 (!) 23 (!) 23   Temp:      TempSrc:      SpO2: 97% 96% 97% 98%  Weight:      Height:        Intake/Output Summary (Last 24 hours) at 10/01/2019 1314 Last data filed at 09/30/2019 2143 Gross per 24 hour  Intake 250 ml  Output --  Net 250 ml   Filed Weights   09/30/19 1032  Weight: (!) 222.3 kg    Examination:  General exam: Appears calm and comfortable.  Obese.  Respiratory system: unlabored Cardiovascular system: RRR. Gastrointestinal system: Abdomen is nondistended, soft and nontender. Central nervous system: Alert and oriented. No focal neurological deficits. Extremities: no LEE Skin: No rashes, lesions or ulcers Psychiatry: Judgement and insight appear normal. Mood & affect appropriate.     Data Reviewed: I have  personally reviewed following labs and imaging studies  CBC: Recent Labs  Lab 09/30/19 1114 09/30/19 1745 10/01/19 0457  WBC 6.7 5.8 5.2  NEUTROABS  --  4.6 4.4  HGB 13.7 14.6 14.2  HCT 45.9 47.6* 46.6*  MCV 89.6 90.7 91.6  PLT 175 193 366    Basic Metabolic Panel: Recent Labs  Lab 09/30/19 1114 09/30/19 1745 10/01/19 0457  NA 137 136 135  K 3.3* 3.6 3.6  CL 100 99 98  CO2 26 24 23   GLUCOSE 109* 105* 142*  BUN 12 13  17  CREATININE 1.10* 1.39* 1.19*  CALCIUM 7.7* 7.7* 7.8*    GFR: Estimated Creatinine Clearance: 102.6 mL/min (Ambri Miltner) (by C-G formula based on SCr of 1.19 mg/dL (H)).  Liver Function Tests: Recent Labs  Lab 09/30/19 1745 10/01/19 0457  AST 31 24  ALT 26 26  ALKPHOS 51 54  BILITOT 1.4* 0.7  PROT 6.2* 6.4*  ALBUMIN 2.8* 2.7*    CBG: No results for input(s): GLUCAP in the last 168 hours.   Recent Results (from the past 240 hour(s))  SARS Coronavirus 2 by RT PCR (hospital order, performed in [facility] hospital lab) Nasopharyngeal Nasopharyngeal Swab     Status: Abnormal   Collection Time: 09/30/19  5:45 PM   Specimen: Nasopharyngeal Swab  Result Value Ref Range Status   SARS Coronavirus 2 POSITIVE (Topanga Alvelo) NEGATIVE Final    Comment: RESULT CALLED TO, READ BACK BY AND VERIFIED WITH: D,SIDBURY @1935  09/30/19 EB (NOTE) SARS-CoV-2 target nucleic acids are DETECTED  SARS-CoV-2 RNA is generally detectable in upper respiratory specimens  during the acute phase of infection.  Positive results are indicative  of the presence of the identified virus, but do not rule out bacterial infection or co-infection with other pathogens not detected by the test.  Clinical correlation with patient history and  other diagnostic information is necessary to determine patient infection status.  The expected result is negative.  Fact Sheet for Patients:   StrictlyIdeas.no   Fact Sheet for Healthcare Providers:    BankingDealers.co.za    This test is not yet approved or cleared by the [place] FDA and  has been authorized for detection and/or diagnosis of SARS-CoV-2 by FDA under an Emergency Use Authorization (EUA).  This EUA will remain in effect (meaning this test can b e used) for the duration of  the COVID-19 declaration under Section 564(b)(1) of the Act, 21 U.S.C. section 360-bbb-3(b)(1), unless the authorization is terminated or revoked sooner.  Performed at [facility], [place] [address], [place], [place] [address]   Blood Culture (routine x 2)     Status: None (Preliminary result)   Collection Time: 09/30/19  5:58 PM   Specimen: BLOOD  Result Value Ref Range Status   Specimen Description BLOOD SITE NOT SPECIFIED  Final   Special Requests   Final    BOTTLES DRAWN AEROBIC AND ANAEROBIC Blood Culture adequate volume   Culture   Final    NO GROWTH < 12 HOURS Performed at [facility], [place] [address] [place], [place] [address]    Report Status PENDING  Incomplete  Blood Culture (routine x 2)     Status: None (Preliminary result)   Collection Time: 09/30/19  6:01 PM   Specimen: BLOOD  Result Value Ref Range Status   Specimen Description BLOOD SITE NOT SPECIFIED  Final   Special Requests   Final    BOTTLES DRAWN AEROBIC ONLY Blood Culture adequate volume   Culture   Final    NO GROWTH < 12 HOURS Performed at [facility], [place] [address] [place], [place] [address]    Report Status PENDING  Incomplete         Radiology Studies: CT ANGIO CHEST PE W OR WO CONTRAST  Result Date: 09/30/2019 CLINICAL DATA:  54 year old female with shortness of breath. Positive COVID-19. Concern for pulmonary embolism. EXAM: CT ANGIOGRAPHY CHEST WITH CONTRAST TECHNIQUE: Multidetector CT imaging of the chest was performed using the standard protocol during bolus administration of intravenous contrast. Multiplanar  CT image reconstructions and MIPs were  obtained to evaluate the vascular anatomy. CONTRAST:  75 cc Omnipaque 350 COMPARISON:  Chest CT dated 02/09/2016. FINDINGS: Evaluation is limited due to artifact caused by body habitus as well as respiratory motion artifact. Cardiovascular: There is no cardiomegaly. Small pericardial effusion measuring 1 cm in thickness anterior to the heart. The thoracic aorta is unremarkable. Evaluation of the pulmonary arteries is very limited due to severe respiratory motion artifact and suboptimal opacification and timing of the contrast. No obvious large or central pulmonary artery embolus identified. V/Q scan may provide better evaluation if there is high clinical concern for acute PE. Mediastinum/Nodes: Bilateral hilar adenopathy measuring 2 cm on the right. The esophagus and the thyroid gland are grossly unremarkable as visualized. No mediastinal fluid collection. Lungs/Pleura: Bilateral confluent airspace opacities with peripheral and subpleural distribution consistent with multifocal pneumonia and in keeping with COVID-19. Clinical correlation and follow-up recommended. There is no pleural effusion or pneumothorax. The central airways are patent. Upper Abdomen: Fatty liver. Musculoskeletal: Degenerative changes of the spine. No acute osseous pathology. Review of the MIP images confirms the above findings. IMPRESSION: 1. Very limited exam. No no obvious large or central pulmonary artery embolus. V/Q scan may provide better evaluation if there is high clinical concern for acute PE. 2. Multifocal pneumonia and in keeping with COVID-19. Clinical correlation and follow-up recommended. 3. Bilateral hilar adenopathy, likely reactive. 4. Fatty liver. Electronically Signed   By: Anner Crete M.D.   On: 09/30/2019 23:02   DG Chest Port 1 View  Result Date: 09/30/2019 CLINICAL DATA:  Shortness of breath.  COVID positive. EXAM: PORTABLE CHEST 1 VIEW COMPARISON:  Radiograph 10/10/2017 FINDINGS: Low lung volumes. Patchy  heterogeneous bilateral airspace opacities in Atziri Zubiate mid-lower lung zone predominant distribution. Borderline mild cardiomegaly. No pneumothorax or large pleural effusion. Technically limited assessment of the lung bases due to portable technique and soft tissue attenuation from habitus. IMPRESSION: Patchy heterogeneous bilateral airspace opacities in Arush Gatliff mid-lower lung zone predominant distribution, most consistent with COVID-19 pneumonia. Electronically Signed   By: Keith Rake M.D.   On: 09/30/2019 17:21        Scheduled Meds: . amLODipine  10 mg Oral Daily  . vitamin C  500 mg Oral Daily  . aspirin EC  81 mg Oral Daily  . enoxaparin (LOVENOX) injection  1 mg/kg Subcutaneous Q12H  . furosemide  80 mg Oral BID  . methylPREDNISolone (SOLU-MEDROL) injection  0.5 mg/kg Intravenous Q12H  . mometasone-formoterol  2 puff Inhalation BID  . zinc sulfate  220 mg Oral Daily   Continuous Infusions: . lactated ringers 100 mL/hr at 09/30/19 2335  . remdesivir 100 mg in NS 100 mL 100 mg (10/01/19 1240)     LOS: 1 day    Time spent: over 30 min    Fayrene Helper, MD [facility]   To contact the attending provider between 7A-7P or the covering provider during after hours 7P-7A, please log into the web site [email or web address] and access using universal [place] password for that web site. If you do not have the password, please call the hospital operator.  10/01/2019, 1:14 PM

## 2019-10-01 NOTE — ED Notes (Signed)
Pt transported to [facility].  

## 2019-10-01 NOTE — ED Notes (Signed)
Pt SpO2 sat decreased to 88% on 2L, increased O2 to 4L via [place].

## 2019-10-01 NOTE — ED Notes (Signed)
Pt assisted to bedside commode and will ring call bell when finished

## 2019-10-01 NOTE — ED Notes (Signed)
Ordered breakfast 

## 2019-10-02 LAB — FERRITIN: Ferritin: 412 ng/mL — ABNORMAL HIGH (ref 11–307)

## 2019-10-02 LAB — COMPREHENSIVE METABOLIC PANEL
ALT: 26 U/L (ref 0–44)
AST: 24 U/L (ref 15–41)
Albumin: 2.5 g/dL — ABNORMAL LOW (ref 3.5–5.0)
Alkaline Phosphatase: 54 U/L (ref 38–126)
Anion gap: 12 (ref 5–15)
BUN: 23 mg/dL — ABNORMAL HIGH (ref 6–20)
CO2: 27 mmol/L (ref 22–32)
Calcium: 8 mg/dL — ABNORMAL LOW (ref 8.9–10.3)
Chloride: 98 mmol/L (ref 98–111)
Creatinine, Ser: 1.22 mg/dL — ABNORMAL HIGH (ref 0.44–1.00)
GFR calc Af Amer: 58 mL/min — ABNORMAL LOW (ref >60)
GFR calc non Af Amer: 50 mL/min — ABNORMAL LOW (ref >60)
Glucose, Bld: 191 mg/dL — ABNORMAL HIGH (ref 70–99)
Potassium: 3.1 mmol/L — ABNORMAL LOW (ref 3.5–5.1)
Sodium: 137 mmol/L (ref 135–145)
Total Bilirubin: 0.5 mg/dL (ref 0.3–1.2)
Total Protein: 6.1 g/dL — ABNORMAL LOW (ref 6.5–8.1)

## 2019-10-02 LAB — CBC WITH DIFFERENTIAL/PLATELET
Abs Immature Granulocytes: 0.06 10*3/uL (ref 0.00–0.07)
Basophils Absolute: 0 10*3/uL (ref 0.0–0.1)
Basophils Relative: 0 %
Eosinophils Absolute: 0 10*3/uL (ref 0.0–0.5)
Eosinophils Relative: 0 %
HCT: 43.7 % (ref 36.0–46.0)
Hemoglobin: 13.3 g/dL (ref 12.0–15.0)
Immature Granulocytes: 1 %
Lymphocytes Relative: 18 %
Lymphs Abs: 1.4 10*3/uL (ref 0.7–4.0)
MCH: 27.4 pg (ref 26.0–34.0)
MCHC: 30.4 g/dL (ref 30.0–36.0)
MCV: 90.1 fL (ref 80.0–100.0)
Monocytes Absolute: 0.5 10*3/uL (ref 0.1–1.0)
Monocytes Relative: 6 %
Neutro Abs: 5.9 10*3/uL (ref 1.7–7.7)
Neutrophils Relative %: 75 %
Platelets: 232 10*3/uL (ref 150–400)
RBC: 4.85 MIL/uL (ref 3.87–5.11)
RDW: 14.3 % (ref 11.5–15.5)
WBC: 7.9 10*3/uL (ref 4.0–10.5)
nRBC: 0 % (ref 0.0–0.2)

## 2019-10-02 LAB — C-REACTIVE PROTEIN: CRP: 5.5 mg/dL — ABNORMAL HIGH (ref <1.0)

## 2019-10-02 LAB — D-DIMER, QUANTITATIVE: D-Dimer, Quant: 1.12 ug/mL-FEU — ABNORMAL HIGH (ref 0.00–0.50)

## 2019-10-02 MED ORDER — FUROSEMIDE 80 MG PO TABS
80.0000 mg | ORAL_TABLET | Freq: Two times a day (BID) | ORAL | Status: DC
  Administered 2019-10-02 – 2019-10-05 (×7): 80 mg via ORAL
  Filled 2019-10-02 (×7): qty 1

## 2019-10-02 MED ORDER — POTASSIUM CHLORIDE CRYS ER 20 MEQ PO TBCR
40.0000 meq | EXTENDED_RELEASE_TABLET | Freq: Once | ORAL | Status: AC
  Administered 2019-10-02: 40 meq via ORAL
  Filled 2019-10-02: qty 2

## 2019-10-02 NOTE — Plan of Care (Signed)
  Problem: Education: Goal: Knowledge of risk factors and measures for prevention of condition will improve Outcome: Progressing   

## 2019-10-02 NOTE — Progress Notes (Signed)
RT NOTE:  CPAP setup at bedside on home settings 12cm H2o via full face mask. Pt is comfortable putting CPAP on when she is ready.

## 2019-10-02 NOTE — Evaluation (Signed)
Physical Therapy Evaluation Patient Details Name: Jenna Chandler MRN: [id number] DOB: 07/25/1965 Today's Date: 10/02/2019   History of Present Illness  Jenna Chandler is a 54 y.o. female with medical history significant for COPD/asthma, hypertension, chronic diastolic CHF, depression/anxiety, OSA, morbid obesity admitted due to SOB, hypoxia and Covid positive.  Clinical Impression  Patient presents with decreased mobility due to limited activity tolerance, decreased strength and cardiorespiratory endurance.  She will benefit from skilled PT in the acute setting to allow return home with family support and likely not need follow up PT.      Follow Up Recommendations No PT follow up    Equipment Recommendations  3in1 (PT) (bariatric 3:1)    Recommendations for Other Services       Precautions / Restrictions Precautions Precautions: Fall      Mobility  Bed Mobility Overal bed mobility: Needs Assistance Bed Mobility: Supine to Sit;Sit to Supine     Supine to sit: Supervision;HOB elevated Sit to supine: Mod assist   General bed mobility comments: increased time and assist for lines coming up to sit, assist for each leg into bed  Transfers Overall transfer level: Needs assistance Equipment used: 4-wheeled walker Transfers: Sit to/from Stand Sit to Stand: Min guard         General transfer comment: up to stand basically on her own; assist for lines  Ambulation/Gait Ambulation/Gait assistance: Min guard Gait Distance (Feet): 170 Feet Assistive device: 4-wheeled walker Gait Pattern/deviations: Step-through pattern;Decreased stride length;Trunk flexed     General Gait Details: heavy UE support at times on RW stopped x 2 to catch her breath, on 3L O2 throughout with SpO2 91%  Stairs            Wheelchair Mobility    Modified Rankin (Stroke Patients Only)       Balance Overall balance assessment: Needs assistance   Sitting balance-Leahy Scale: Good      Standing balance support: Bilateral upper extremity supported Standing balance-Leahy Scale: Poor Standing balance comment: UE support for balance, assisted for hygiene in standing due to urine soaked bed                             Pertinent Vitals/Pain Pain Assessment: No/denies pain    Home Living Family/patient expects to be discharged to:: Private residence Living Arrangements: Alone Available Help at Discharge: Family Type of Home: Apartment Home Access: Level entry     Home Layout: One level Home Equipment: Other (comment);Walker - 4 wheels Additional Comments: uses regular chair in the tub    Prior Function                 Hand Dominance   Dominant Hand: Right    Extremity/Trunk Assessment        Lower Extremity Assessment Lower Extremity Assessment: Generalized weakness    Cervical / Trunk Assessment Cervical / Trunk Assessment: Other exceptions Cervical / Trunk Exceptions: leans over to help with breathing  Communication   Communication: No difficulties  Cognition Arousal/Alertness: Awake/alert Behavior During Therapy: WFL for tasks assessed/performed Overall Cognitive Status: Within Functional Limits for tasks assessed                                        General Comments General comments (skin integrity, edema, etc.): Heading to supine O2 not attached and SpO2 dropped  to 81% on RA while pt scooting up in bed, back to 90's back on 3L O2 within <1 minute    Exercises     Assessment/Plan    PT Assessment Patient needs continued PT services  PT Problem List Decreased strength;Decreased mobility;Decreased activity tolerance       PT Treatment Interventions DME instruction;Therapeutic activities;Gait training;Functional mobility training;Therapeutic exercise;Patient/family education    PT Goals (Current goals can be found in the Care Plan section)  Acute Rehab PT Goals Patient Stated Goal: to go home PT Goal  Formulation: With patient Time For Goal Achievement: 10/16/19 Potential to Achieve Goals: Good    Frequency Min 3X/week   Barriers to discharge        Co-evaluation               AM-PAC PT "6 Clicks" Mobility  Outcome Measure Help needed turning from your back to your side while in a flat bed without using bedrails?: A Little Help needed moving from lying on your back to sitting on the side of a flat bed without using bedrails?: A Little Help needed moving to and from a bed to a chair (including a wheelchair)?: A Little Help needed standing up from a chair using your arms (e.g., wheelchair or bedside chair)?: A Little Help needed to walk in hospital room?: A Little Help needed climbing 3-5 steps with a railing? : A Lot 6 Click Score: 17    End of Session Equipment Utilized During Treatment: Oxygen Activity Tolerance: Patient limited by fatigue Patient left: in bed;with call bell/phone within reach   PT Visit Diagnosis: Muscle weakness (generalized) (M62.81)    Time: 3437-3578 PT Time Calculation (min) (ACUTE ONLY): 38 min   Charges:   PT Evaluation $PT Eval Moderate Complexity: 1 Mod PT Treatments $Gait Training: 8-22 mins $Therapeutic Activity: 8-22 mins        Magda Kiel, PT Acute Rehabilitation Services [phone] Office:[phone] 10/02/2019   Reginia Naas 10/02/2019, 6:06 PM

## 2019-10-02 NOTE — Progress Notes (Signed)
PHARMACY - PHYSICIAN COMMUNICATION CRITICAL VALUE ALERT - BLOOD CULTURE IDENTIFICATION (BCID)  Jenna Chandler is an 54 y.o. female who presented to [facility] on 09/30/2019 with a chief complaint of pneumonia secondary to COVID-19.  Assessment:  WBC WNL, afebrile  Name of physician (or Provider) Contacted:Dr. Opyd  Current antibiotics: None  Changes to prescribed antibiotics recommended:  Monitor off anti-biotics for now  Results for orders placed or performed during the hospital encounter of 09/30/19  Blood Culture ID Panel (Reflexed) (Collected: 09/30/2019  5:58 PM)  Result Value Ref Range   Enterococcus faecalis NOT DETECTED NOT DETECTED   Enterococcus Faecium NOT DETECTED NOT DETECTED   Listeria monocytogenes NOT DETECTED NOT DETECTED   Staphylococcus species DETECTED (A) NOT DETECTED   Staphylococcus aureus (BCID) NOT DETECTED NOT DETECTED   Staphylococcus epidermidis DETECTED (A) NOT DETECTED   Staphylococcus lugdunensis NOT DETECTED NOT DETECTED   Streptococcus species NOT DETECTED NOT DETECTED   Streptococcus agalactiae NOT DETECTED NOT DETECTED   Streptococcus pneumoniae NOT DETECTED NOT DETECTED   Streptococcus pyogenes NOT DETECTED NOT DETECTED   A.calcoaceticus-baumannii NOT DETECTED NOT DETECTED   Bacteroides fragilis NOT DETECTED NOT DETECTED   Enterobacterales NOT DETECTED NOT DETECTED   Enterobacter cloacae complex NOT DETECTED NOT DETECTED   Escherichia coli NOT DETECTED NOT DETECTED   Klebsiella aerogenes NOT DETECTED NOT DETECTED   Klebsiella oxytoca NOT DETECTED NOT DETECTED   Klebsiella pneumoniae NOT DETECTED NOT DETECTED   Proteus species NOT DETECTED NOT DETECTED   Salmonella species NOT DETECTED NOT DETECTED   Serratia marcescens NOT DETECTED NOT DETECTED   Haemophilus influenzae NOT DETECTED NOT DETECTED   Neisseria meningitidis NOT DETECTED NOT DETECTED   Pseudomonas aeruginosa NOT DETECTED NOT DETECTED   Stenotrophomonas maltophilia NOT DETECTED  NOT DETECTED   Candida albicans NOT DETECTED NOT DETECTED   Candida auris NOT DETECTED NOT DETECTED   Candida glabrata NOT DETECTED NOT DETECTED   Candida krusei NOT DETECTED NOT DETECTED   Candida parapsilosis NOT DETECTED NOT DETECTED   Candida tropicalis NOT DETECTED NOT DETECTED   Cryptococcus neoformans/gattii NOT DETECTED NOT DETECTED   Methicillin resistance mecA/C DETECTED (A) NOT DETECTED    Narda Bonds 10/02/2019  5:29 AM

## 2019-10-02 NOTE — Plan of Care (Signed)

## 2019-10-02 NOTE — TOC Initial Note (Signed)
Transition of Care [facility]) - Initial/Assessment Note    Patient Details  Name: Jenna Chandler MRN: [id number] Date of Birth: Sep 07, 1965  Transition of Care [facility]) CM/SW Contact:    Verdell Carmine, RN Phone Number: 10/02/2019, 3:26 PM  Clinical Narrative:                 Patient presented after noting a cough with fever, positive COVID people around her testing positive. Mother in hospital positive.  Patient has history of Asthma, sees pulmonologist.  On o2 currently, may need home O2. CM will follow for needs  Expected Discharge Plan: [place] Barriers to Discharge: Continued Medical Work up   Patient Goals and CMS Choice Patient states their goals for this hospitalization and ongoing recovery are:: Go home      Expected Discharge Plan and Services Expected Discharge Plan: [place]   Discharge Planning Services: CM Consult   Living arrangements for the past 2 months: Single Family Home                                      Prior Living Arrangements/Services Living arrangements for the past 2 months: Single Family Home Lives with:: Relatives Patient language and need for interpreter reviewed:: Yes Do you feel safe going back to the place where you live?: Yes      Need for Family Participation in Patient Care: Yes (Comment) Care giver support system in place?: Yes (comment)   Criminal Activity/Legal Involvement Pertinent to Current Situation/Hospitalization: No - Comment as needed  Activities of Daily Living Home Assistive Devices/Equipment: None ADL Screening (condition at time of admission) Patient's cognitive ability adequate to safely complete daily activities?: Yes Is the patient deaf or have difficulty hearing?: No Does the patient have difficulty seeing, even when wearing glasses/contacts?: No Does the patient have difficulty concentrating, remembering, or making decisions?: No Patient able to express need for  assistance with ADLs?: Yes Does the patient have difficulty dressing or bathing?: No Independently performs ADLs?: Yes (appropriate for developmental age) Does the patient have difficulty walking or climbing stairs?: No Weakness of Legs: Both Weakness of Arms/Hands: None  Permission Sought/Granted Permission sought to share information with : Case Manager    Share Information with NAME: Mellody Memos Sister           Emotional Assessment       Orientation: : Oriented to  Time, Oriented to Place, Oriented to Self, Oriented to Situation Alcohol / Substance Use: Not Applicable Psych Involvement: No (comment)  Admission diagnosis:  SOB (shortness of breath) [R06.02] Pneumonia due to COVID-19 virus [U07.1, J12.82] COVID-19 [U07.1] Patient Active Problem List   Diagnosis Date Noted  . Pneumonia due to COVID-19 virus 09/30/2019  . Hypoxia 09/30/2019  . Prolonged QT interval 09/30/2019  . AKI (acute kidney injury) ([place]) 09/30/2019  . Restrictive lung disease 01/27/2019  . Fibromyalgia 04/30/2018  . SOB (shortness of breath) 05/05/2014  . Chest pain 05/05/2014  . Chronic respiratory failure ([place]) 04/26/2012  . OSA (obstructive sleep apnea) 06/02/2011  . Lethargy 06/02/2011  . Diastolic CHF, chronic ([place]) 06/01/2011  . 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 HTN (hypertension) 05/11/2011  . Morbid obesity ([place]) 05/11/2011  . Normocytic anemia 05/11/2011  . Manic, depressive ([place])    PCP:  Nolene Ebbs, MD Pharmacy:   [facility] [place], [place] -  [address] [place] [address] Phone: [phone] Fax: [phone]     Social Determinants of Health (SDOH) Interventions    Readmission Risk Interventions No flowsheet data found.

## 2019-10-02 NOTE — Progress Notes (Addendum)
PROGRESS NOTE    Jenna Chandler  GGY:[id number] DOB: 30-Aug-1965 DOA: 09/30/2019 PCP: Nolene Ebbs, MD   No chief complaint on file.   Brief Narrative:  Jenna Chandler is Jenna Chandler 54 y.o. female with medical history significant for COPD/asthma, hypertension, chronic diastolic CHF, depression/anxiety, OSA, morbid obesity who presents to the hospital complaining of shortness of breath with cough.  She reports she has been feeling bad since August 13.  She was tested for Covid at [facility] on 16 August and was told she was positive on 18 August.  She has not been treated with any antiviral or monoclonal antibodies as an outpatient.  She was seen by her pulmonologist yesterday and was prescribed an antibiotic.  Not sure which one.  Today she had some hard coughing with some mild blood-tinged sputum.  Had no gross hemoptysis.  She has had subjective fever and chills.  Denies any nausea, vomiting or diarrhea.  She does have generalized body aches.  She does have Jenna Chandler history of fibromyalgia but body aches seem more pronounced than her normal.  She used Tylenol and Motrin at home but with her worsening shortness of breath she came to the emergency room. Her brother and his wife have been living with her for the last week in the day they moved and they started coughing she thinks they were diagnosed with Covid Jenna Chandler few days ago.  She does not use oxygen at home.  She does have Jenna Chandler history of OSA and uses CPAP at night. History of smoking but quit in 2014.  She denies any alcohol or illicit drug use.  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   HTN (hypertension)   Morbid obesity ([name])   Diastolic CHF, chronic ([name])   OSA (obstructive sleep apnea)   Hypoxia   Prolonged QT interval   AKI (acute kidney injury) (Jenna Chandler)  Acute Hypoxic Respiratory Failure 2/2 COVID 19 Pneumonia Unvaccinated  CXR with patchy  herterogenous bilateral airspace opacities in mid lower lung zone predominant distribution CT PE protocol, limited exam, no obvious large or central PE, multifocal pneumonia Satting 93% on 3 L [place] -> wean as tolerated Continue remdesivir and solumedrol  I/O, daily weights Elevated inflammatory markers, will trend OOB, prone as able, IS, flutter, PT  COVID-19 Labs  Recent Labs    09/30/19 1745 10/01/19 0457 10/02/19 0313  DDIMER 2.04*  --  1.12*  FERRITIN 314* 361* 412*  LDH 422*  --   --   CRP 9.4* 11.2* 5.5*    Lab Results  Component Value Date   SARSCOV2NAA POSITIVE (Jenna Chandler) 09/30/2019   Staph Epidermidis in 1 of 2 Blood Cx: suspected contaminant, follow results  Hemopytsis  Elevated D dimer: currently on full dose anticoagulation with concern for PE, CT not adequate to r/o PE.  Follow LE [place] (negative for DVT).   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  Holding HCTZ/arb with AKI.  Diastolic CHF, chronic ([name]) Continue home Lasix dose.   Prolonged QT interval QTc 516 Repeat EKG today Continue to monitor, caution with QT prolonging meds   AKI (acute kidney injury) (Jenna Chandler) Improved, continue to monitor  Morbid obesity (Jenna Chandler) Chronic morbid obesity.  Patient to follow-up with PCP for dietary lifestyle interventions for weight loss.  Patient may also  benefit from referral to bariatric surgery for evaluation  OSA (obstructive sleep apnea) Continue home as needed for OSA.  RT to follow  DVT prophylaxis: full dose lovenox Code Status: full  Family Communication: none at bedside Disposition:   Status is: Inpatient  Remains inpatient appropriate because:Inpatient level of care appropriate due to severity of illness   Dispo: The patient is from: Home              Anticipated d/c is to: Home              Anticipated d/c date is: > 3 days               Patient currently is not medically stable to d/c.       Consultants:   none  Procedures: none  Antimicrobials:  Anti-infectives (From admission, onward)   Start     Dose/Rate Route Frequency Ordered Stop   10/01/19 1000  remdesivir 100 mg in sodium chloride 0.9 % 100 mL IVPB       "Followed by" Linked Group Details   100 mg 200 mL/hr over 30 Minutes Intravenous Daily 09/30/19 1954 10/05/19 0959   10/01/19 1000  remdesivir 100 mg in sodium chloride 0.9 % 100 mL IVPB  Status:  Discontinued       "Followed by" Linked Group Details   100 mg 200 mL/hr over 30 Minutes Intravenous Daily 09/30/19 2300 10/01/19 0028   09/30/19 2300  remdesivir 200 mg in sodium chloride 0.9% 250 mL IVPB  Status:  Discontinued       "Followed by" Linked Group Details   200 mg 580 mL/hr over 30 Minutes Intravenous Once 09/30/19 2300 10/01/19 0028   09/30/19 2030  remdesivir 200 mg in sodium chloride 0.9% 250 mL IVPB       "Followed by" Linked Group Details   200 mg 580 mL/hr over 30 Minutes Intravenous Once 09/30/19 1954 09/30/19 2143         Subjective: Feels Jenna Chandler little better  Objective: Vitals:   10/02/19 0808 10/02/19 1204 10/02/19 1624 10/02/19 1724  BP: 131/83 132/74 112/89 112/89  Pulse: 75 78 84   Resp: 15 20 19    Temp: 97.9 F (36.6 C) 97.9 F (36.6 C) 97.9 F (36.6 C)   TempSrc: Oral Oral Oral   SpO2: 94% 95% 93%   Weight:      Height:        Intake/Output Summary (Last 24 hours) at 10/02/2019 1749 Last data filed at 10/02/2019 0056 Gross per 24 hour  Intake --  Output 700 ml  Net -700 ml   Filed Weights   09/30/19 1032  Weight: (!) 222.3 kg    Examination:  General: No acute distress. Cardiovascular: Heart sounds show Jenna Chandler regular rate, and rhythm Lungs: Clear to auscultation bilaterally Abdomen: Soft, nontender, nondistended Neurological: Alert and oriented 3. Moves all extremities 4 . Cranial nerves II through XII grossly intact. Skin: Warm and dry. No  rashes or lesions. Extremities: No clubbing or cyanosis. No edema  Data Reviewed: I have personally reviewed following labs and imaging studies  CBC: Recent Labs  Lab 09/30/19 1114 09/30/19 1745 10/01/19 0457 10/02/19 0313  WBC 6.7 5.8 5.2 7.9  NEUTROABS  --  4.6 4.4 5.9  HGB 13.7 14.6 14.2 13.3  HCT 45.9 47.6* 46.6* 43.7  MCV 89.6 90.7 91.6 90.1  PLT 175 193 205 638    Basic Metabolic Panel: Recent Labs  Lab 09/30/19 1114 09/30/19 1745 10/01/19 0457  10/02/19 0313  NA 137 136 135 137  K 3.3* 3.6 3.6 3.1*  CL 100 99 98 98  CO2 26 24 23 27   GLUCOSE 109* 105* 142* 191*  BUN 12 13 17  23*  CREATININE 1.10* 1.39* 1.19* 1.22*  CALCIUM 7.7* 7.7* 7.8* 8.0*    GFR: Estimated Creatinine Clearance: 100.1 mL/min (Jevaeh Shams) (by C-G formula based on SCr of 1.22 mg/dL (H)).  Liver Function Tests: Recent Labs  Lab 09/30/19 1745 10/01/19 0457 10/02/19 0313  AST 31 24 24   ALT 26 26 26   ALKPHOS 51 54 54  BILITOT 1.4* 0.7 0.5  PROT 6.2* 6.4* 6.1*  ALBUMIN 2.8* 2.7* 2.5*    CBG: No results for input(s): GLUCAP in the last 168 hours.   Recent Results (from the past 240 hour(s))  SARS Coronavirus 2 by RT PCR (hospital order, performed in [facility] hospital lab) Nasopharyngeal Nasopharyngeal Swab     Status: Abnormal   Collection Time: 09/30/19  5:45 PM   Specimen: Nasopharyngeal Swab  Result Value Ref Range Status   SARS Coronavirus 2 POSITIVE ([place]) NEGATIVE Final    Comment: RESULT CALLED TO, READ BACK BY AND VERIFIED WITH: D,SIDBURY @1935  09/30/19 EB (NOTE) SARS-CoV-2 target nucleic acids are DETECTED  SARS-CoV-2 RNA is generally detectable in upper respiratory specimens  during the acute phase of infection.  Positive results are indicative  of the presence of the identified virus, but do not rule out bacterial infection or co-infection with other pathogens not detected by the test.  Clinical correlation with patient history and  other diagnostic information is necessary  to determine patient infection status.  The expected result is negative.  Fact Sheet for Patients:   StrictlyIdeas.no   Fact Sheet for Healthcare Providers:   BankingDealers.co.za    This test is not yet approved or cleared by the [place] FDA and  has been authorized for detection and/or diagnosis of SARS-CoV-2 by FDA under an Emergency Use Authorization (EUA).  This EUA will remain in effect (meaning this test can b e used) for the duration of  the COVID-19 declaration under Section 564(b)(1) of the Act, 21 U.S.C. section 360-bbb-3(b)(1), unless the authorization is terminated or revoked sooner.  Performed at [facility], [place] [address] [place], [place] [address]   Blood Culture (routine x 2)     Status: Abnormal (Preliminary result)   Collection Time: 09/30/19  5:58 PM   Specimen: BLOOD  Result Value Ref Range Status   Specimen Description BLOOD SITE NOT SPECIFIED  Final   Special Requests   Final    BOTTLES DRAWN AEROBIC AND ANAEROBIC Blood Culture adequate volume   Culture  Setup Time   Final    GRAM POSITIVE COCCI IN CLUSTERS AEROBIC BOTTLE ONLY Organism ID to follow CRITICAL RESULT CALLED TO, READ BACK BY AND VERIFIED WITH: Karsten Ro [facility] 10/01/19 2319 JDW Performed at [facility], [place] [address] [place], [place] [address]    Culture STAPHYLOCOCCUS EPIDERMIDIS (Lennon Richins)  Final   Report Status PENDING  Incomplete  Blood Culture ID Panel (Reflexed)     Status: Abnormal   Collection Time: 09/30/19  5:58 PM  Result Value Ref Range Status   Enterococcus faecalis NOT DETECTED NOT DETECTED Final   Enterococcus Faecium NOT DETECTED NOT DETECTED Final   Listeria monocytogenes NOT DETECTED NOT DETECTED Final   Staphylococcus species DETECTED (Denman Pichardo) NOT DETECTED Final   Staphylococcus aureus (BCID) NOT DETECTED NOT DETECTED Final   Staphylococcus epidermidis DETECTED (  Numa Schroeter) NOT DETECTED Final    Comment: Methicillin  (oxacillin) resistant coagulase negative staphylococcus. Possible blood culture contaminant (unless isolated from more than one blood culture draw or clinical case suggests pathogenicity). No antibiotic treatment is indicated for blood  culture contaminants. CRITICAL RESULT CALLED TO, READ BACK BY AND VERIFIED WITH: J LEDFORD PHARMD 10/01/19 2319 JDW    Staphylococcus lugdunensis NOT DETECTED NOT DETECTED Final   Streptococcus species NOT DETECTED NOT DETECTED Final   Streptococcus agalactiae NOT DETECTED NOT DETECTED Final   Streptococcus pneumoniae NOT DETECTED NOT DETECTED Final   Streptococcus pyogenes NOT DETECTED NOT DETECTED Final   Joni Norrod.calcoaceticus-baumannii NOT DETECTED NOT DETECTED Final   Bacteroides fragilis NOT DETECTED NOT DETECTED Final   Enterobacterales NOT DETECTED NOT DETECTED Final   Enterobacter cloacae complex NOT DETECTED NOT DETECTED Final   Escherichia coli NOT DETECTED NOT DETECTED Final   Klebsiella aerogenes NOT DETECTED NOT DETECTED Final   Klebsiella oxytoca NOT DETECTED NOT DETECTED Final   Klebsiella pneumoniae NOT DETECTED NOT DETECTED Final   Proteus species NOT DETECTED NOT DETECTED Final   Salmonella species NOT DETECTED NOT DETECTED Final   Serratia marcescens NOT DETECTED NOT DETECTED Final   Haemophilus influenzae NOT DETECTED NOT DETECTED Final   Neisseria meningitidis NOT DETECTED NOT DETECTED Final   Pseudomonas aeruginosa NOT DETECTED NOT DETECTED Final   Stenotrophomonas maltophilia NOT DETECTED NOT DETECTED Final   Candida albicans NOT DETECTED NOT DETECTED Final   Candida auris NOT DETECTED NOT DETECTED Final   Candida glabrata NOT DETECTED NOT DETECTED Final   Candida krusei NOT DETECTED NOT DETECTED Final   Candida parapsilosis NOT DETECTED NOT DETECTED Final   Candida tropicalis NOT DETECTED NOT DETECTED Final   Cryptococcus neoformans/gattii NOT DETECTED NOT DETECTED Final   Methicillin resistance mecA/C DETECTED (Rayquon Uselman) NOT DETECTED Final      Comment: CRITICAL RESULT CALLED TO, READ BACK BY AND VERIFIED WITHKarsten Ro [facility] 10/01/19 2319 JDW Performed at [facility], [address], [place], [place] [address]   Blood Culture (routine x 2)     Status: None (Preliminary result)   Collection Time: 09/30/19  6:01 PM   Specimen: BLOOD  Result Value Ref Range Status   Specimen Description BLOOD SITE NOT SPECIFIED  Final   Special Requests   Final    BOTTLES DRAWN AEROBIC ONLY Blood Culture adequate volume   Culture   Final    NO GROWTH 2 DAYS Performed at [facility], [place] [address], [place], [place] [address]    Report Status PENDING  Incomplete         Radiology Studies: CT ANGIO CHEST PE W OR WO CONTRAST  Result Date: 09/30/2019 CLINICAL DATA:  54 year old female with shortness of breath. Positive COVID-19. Concern for pulmonary embolism. EXAM: CT ANGIOGRAPHY CHEST WITH CONTRAST TECHNIQUE: Multidetector CT imaging of the chest was performed using the standard protocol during bolus administration of intravenous contrast. Multiplanar CT image reconstructions and MIPs were obtained to evaluate the vascular anatomy. CONTRAST:  75 cc Omnipaque 350 COMPARISON:  Chest CT dated 02/09/2016. FINDINGS: Evaluation is limited due to artifact caused by body habitus as well as respiratory motion artifact. Cardiovascular: There is no cardiomegaly. Small pericardial effusion measuring 1 cm in thickness anterior to the heart. The thoracic aorta is unremarkable. Evaluation of the pulmonary arteries is very limited due to severe respiratory motion artifact and suboptimal opacification and timing of the contrast. No obvious large or central pulmonary artery embolus identified. V/Q  scan may provide better evaluation if there is high clinical concern for acute PE. Mediastinum/Nodes: Bilateral hilar adenopathy measuring 2 cm on the right. The esophagus and the thyroid gland are grossly unremarkable as visualized. No mediastinal fluid  collection. Lungs/Pleura: Bilateral confluent airspace opacities with peripheral and subpleural distribution consistent with multifocal pneumonia and in keeping with COVID-19. Clinical correlation and follow-up recommended. There is no pleural effusion or pneumothorax. The central airways are patent. Upper Abdomen: Fatty liver. Musculoskeletal: Degenerative changes of the spine. No acute osseous pathology. Review of the MIP images confirms the above findings. IMPRESSION: 1. Very limited exam. No no obvious large or central pulmonary artery embolus. V/Q scan may provide better evaluation if there is high clinical concern for acute PE. 2. Multifocal pneumonia and in keeping with COVID-19. Clinical correlation and follow-up recommended. 3. Bilateral hilar adenopathy, likely reactive. 4. Fatty liver. Electronically Signed   By: Anner Crete M.D.   On: 09/30/2019 23:02   DG Chest Portable 1 View  Result Date: 10/01/2019 CLINICAL DATA:  History of COVID-19 positivity EXAM: PORTABLE CHEST 1 VIEW COMPARISON:  09/30/2019 FINDINGS: Cardiac shadow is within normal limits. The lungs are well aerated bilaterally. Patchy airspace opacities are noted right greater than left consistent with the given clinical history of COVID-19 positivity. IMPRESSION: Changes consistent with bilateral COVID pneumonia. Electronically Signed   By: Inez Catalina M.D.   On: 10/01/2019 21:37   VAS [place] LOWER EXTREMITY VENOUS (DVT)  Result Date: 10/02/2019  Lower Venous DVTStudy Indications: Pain, and Swelling.  Limitations: Body habitus and poor ultrasound/tissue interface. Comparison Study: 10/12/2016 rt leg Performing Technologist: Griffin Basil RCT RDMS  Examination Guidelines: Sheliah Fiorillo complete evaluation includes B-mode imaging, spectral Doppler, color Doppler, and power Doppler as needed of all accessible portions of each vessel. Bilateral testing is considered an integral part of Carnell Casamento complete examination. Limited examinations for reoccurring  indications may be performed as noted. The reflux portion of the exam is performed with the patient in reverse Trendelenburg.  +---------+---------------+---------+-----------+----------+------------------+ RIGHT    CompressibilityPhasicitySpontaneityPropertiesThrombus Aging     +---------+---------------+---------+-----------+----------+------------------+ CFV      Full           Yes      Yes                                     +---------+---------------+---------+-----------+----------+------------------+ SFJ      Full                                                            +---------+---------------+---------+-----------+----------+------------------+ FV Prox  Full                                                            +---------+---------------+---------+-----------+----------+------------------+ FV Mid   Full                                                            +---------+---------------+---------+-----------+----------+------------------+  FV DistalFull                                                            +---------+---------------+---------+-----------+----------+------------------+ PFV      Full                                                            +---------+---------------+---------+-----------+----------+------------------+ POP      Full           Yes      Yes                                     +---------+---------------+---------+-----------+----------+------------------+ PTV      Full                                                            +---------+---------------+---------+-----------+----------+------------------+ PERO                                                  partial visualized +---------+---------------+---------+-----------+----------+------------------+   +---------+---------------+---------+-----------+----------+------------------+ LEFT      CompressibilityPhasicitySpontaneityPropertiesThrombus Aging     +---------+---------------+---------+-----------+----------+------------------+ CFV      Full           Yes      Yes                                     +---------+---------------+---------+-----------+----------+------------------+ SFJ      Full                                                            +---------+---------------+---------+-----------+----------+------------------+ FV Prox  Full                                                            +---------+---------------+---------+-----------+----------+------------------+ FV Mid   Full                                                            +---------+---------------+---------+-----------+----------+------------------+ FV DistalFull                                                            +---------+---------------+---------+-----------+----------+------------------+  PFV      Full                                                            +---------+---------------+---------+-----------+----------+------------------+ POP      Full           Yes      Yes                                     +---------+---------------+---------+-----------+----------+------------------+ PTV      Full                                                            +---------+---------------+---------+-----------+----------+------------------+ PERO                                                  partial visualized +---------+---------------+---------+-----------+----------+------------------+     Summary: RIGHT: - There is no evidence of deep vein thrombosis in the lower extremity. However, portions of this examination were limited- see technologist comments above.  - No cystic structure found in the popliteal fossa.  LEFT: - There is no evidence of deep vein thrombosis in the lower extremity. However, portions of this examination were limited-  see technologist comments above.  - No cystic structure found in the popliteal fossa.  *See table(s) above for measurements and observations. Electronically signed by Monica Martinez MD on 10/02/2019 at 3:11:51 PM.    Final         Scheduled Meds: . amLODipine  10 mg Oral Daily  . vitamin C  500 mg Oral Daily  . aspirin EC  81 mg Oral Daily  . cloNIDine  0.2 mg Oral BID  . enoxaparin (LOVENOX) injection  1 mg/kg Subcutaneous Q12H  . furosemide  80 mg Oral BID  . methylPREDNISolone (SOLU-MEDROL) injection  0.5 mg/kg Intravenous Q12H  . mometasone-formoterol  2 puff Inhalation BID  . zinc sulfate  220 mg Oral Daily   Continuous Infusions: . remdesivir 100 mg in NS 100 mL 100 mg (10/02/19 0940)     LOS: 2 days    Time spent: over 30 min    Fayrene Helper, MD [facility]   To contact the attending provider between 7A-7P or the covering provider during after hours 7P-7A, please log into the web site [email or web address] and access using universal [place] password for that web site. If you do not have the password, please call the hospital operator.  10/02/2019, 5:49 PM

## 2019-10-03 LAB — COMPREHENSIVE METABOLIC PANEL
ALT: 28 U/L (ref 0–44)
AST: 22 U/L (ref 15–41)
Albumin: 2.6 g/dL — ABNORMAL LOW (ref 3.5–5.0)
Alkaline Phosphatase: 51 U/L (ref 38–126)
Anion gap: 11 (ref 5–15)
BUN: 28 mg/dL — ABNORMAL HIGH (ref 6–20)
CO2: 27 mmol/L (ref 22–32)
Calcium: 8.1 mg/dL — ABNORMAL LOW (ref 8.9–10.3)
Chloride: 103 mmol/L (ref 98–111)
Creatinine, Ser: 1.1 mg/dL — ABNORMAL HIGH (ref 0.44–1.00)
GFR calc Af Amer: >60 mL/min (ref >60)
GFR calc non Af Amer: 56 mL/min — ABNORMAL LOW (ref >60)
Glucose, Bld: 169 mg/dL — ABNORMAL HIGH (ref 70–99)
Potassium: 4.2 mmol/L (ref 3.5–5.1)
Sodium: 141 mmol/L (ref 135–145)
Total Bilirubin: 0.6 mg/dL (ref 0.3–1.2)
Total Protein: 6 g/dL — ABNORMAL LOW (ref 6.5–8.1)

## 2019-10-03 LAB — GLUCOSE, CAPILLARY
Glucose-Capillary: 157 mg/dL — ABNORMAL HIGH (ref 70–99)
Glucose-Capillary: 161 mg/dL — ABNORMAL HIGH (ref 70–99)
Glucose-Capillary: 203 mg/dL — ABNORMAL HIGH (ref 70–99)
Glucose-Capillary: 184 mg/dL — ABNORMAL HIGH (ref 70–99)

## 2019-10-03 LAB — PHOSPHORUS: Phosphorus: 4 mg/dL (ref 2.5–4.6)

## 2019-10-03 LAB — CBC WITH DIFFERENTIAL/PLATELET
Abs Immature Granulocytes: 0.11 10*3/uL — ABNORMAL HIGH (ref 0.00–0.07)
Basophils Absolute: 0 10*3/uL (ref 0.0–0.1)
Basophils Relative: 0 %
Eosinophils Absolute: 0.1 10*3/uL (ref 0.0–0.5)
Eosinophils Relative: 1 %
HCT: 44.4 % (ref 36.0–46.0)
Hemoglobin: 13.6 g/dL (ref 12.0–15.0)
Immature Granulocytes: 1 %
Lymphocytes Relative: 12 %
Lymphs Abs: 1.1 10*3/uL (ref 0.7–4.0)
MCH: 27.5 pg (ref 26.0–34.0)
MCHC: 30.6 g/dL (ref 30.0–36.0)
MCV: 89.9 fL (ref 80.0–100.0)
Monocytes Absolute: 0.6 10*3/uL (ref 0.1–1.0)
Monocytes Relative: 6 %
Neutro Abs: 7.5 10*3/uL (ref 1.7–7.7)
Neutrophils Relative %: 80 %
Platelets: 279 10*3/uL (ref 150–400)
RBC: 4.94 MIL/uL (ref 3.87–5.11)
RDW: 14.2 % (ref 11.5–15.5)
WBC: 9.4 10*3/uL (ref 4.0–10.5)
nRBC: 0 % (ref 0.0–0.2)

## 2019-10-03 LAB — CULTURE, BLOOD (ROUTINE X 2): Special Requests: ADEQUATE

## 2019-10-03 LAB — MAGNESIUM: Magnesium: 2.3 mg/dL (ref 1.7–2.4)

## 2019-10-03 LAB — HEMOGLOBIN A1C
Hgb A1c MFr Bld: 6.2 % — ABNORMAL HIGH (ref 4.8–5.6)
Mean Plasma Glucose: 131.24 mg/dL

## 2019-10-03 LAB — FERRITIN: Ferritin: 351 ng/mL — ABNORMAL HIGH (ref 11–307)

## 2019-10-03 LAB — C-REACTIVE PROTEIN: CRP: 1.7 mg/dL — ABNORMAL HIGH (ref <1.0)

## 2019-10-03 LAB — D-DIMER, QUANTITATIVE: D-Dimer, Quant: 0.62 ug/mL-FEU — ABNORMAL HIGH (ref 0.00–0.50)

## 2019-10-03 MED ORDER — ENOXAPARIN SODIUM 120 MG/0.8ML ~~LOC~~ SOLN
120.0000 mg | SUBCUTANEOUS | Status: DC
  Administered 2019-10-03 – 2019-10-05 (×3): 120 mg via SUBCUTANEOUS
  Filled 2019-10-03 (×3): qty 0.8

## 2019-10-03 MED ORDER — INSULIN ASPART 100 UNIT/ML ~~LOC~~ SOLN
0.0000 [IU] | Freq: Three times a day (TID) | SUBCUTANEOUS | Status: DC
  Administered 2019-10-03: 2 [IU] via SUBCUTANEOUS
  Administered 2019-10-03: 3 [IU] via SUBCUTANEOUS
  Administered 2019-10-03: 2 [IU] via SUBCUTANEOUS
  Administered 2019-10-04: 3 [IU] via SUBCUTANEOUS
  Administered 2019-10-04: 2 [IU] via SUBCUTANEOUS
  Administered 2019-10-04 – 2019-10-05 (×4): 3 [IU] via SUBCUTANEOUS

## 2019-10-03 NOTE — Plan of Care (Signed)
  Problem: Education: Goal: Knowledge of risk factors and measures for prevention of condition will improve Outcome: Progressing   Problem: Respiratory: Goal: Will maintain a patent airway Outcome: Progressing Goal: Complications related to the disease process, condition or treatment will be avoided or minimized Outcome: Progressing  Assisted patient to wear O2 while performing adl's such as transferring to [facility] to avoid dyspnea.

## 2019-10-03 NOTE — Progress Notes (Signed)
PROGRESS NOTE    Jenna Chandler  [name]:[id number] DOB: 1966/01/22 DOA: 09/30/2019 PCP: Jenna Ebbs, MD   No chief complaint on file.   Brief Narrative:  Jenna Chandler is Jenna Chandler 54 y.o. female with medical history significant for COPD/asthma, hypertension, chronic diastolic CHF, depression/anxiety, OSA, morbid obesity who presents to the hospital complaining of shortness of breath with cough.  She reports she has been feeling bad since August 13.  She was tested for Covid at [facility] on 16 August and was told she was positive on 18 August.  She has not been treated with any antiviral or monoclonal antibodies as an outpatient.  She was seen by her pulmonologist yesterday and was prescribed an antibiotic.  Not sure which one.  Today she had some hard coughing with some mild blood-tinged sputum.  Had no gross hemoptysis.  She has had subjective fever and chills.  Denies any nausea, vomiting or diarrhea.  She does have generalized body aches.  She does have Jenna Chandler history of fibromyalgia but body aches seem more pronounced than her normal.  She used Tylenol and Motrin at home but with her worsening shortness of breath she came to the emergency room. Her brother and his wife have been living with her for the last week in the day they moved and they started coughing she thinks they were diagnosed with Covid Jenna Chandler few days ago.  She does not use oxygen at home.  She does have Jenna Chandler history of OSA and uses CPAP at night. History of smoking but quit in 2014.  She denies any alcohol or illicit drug use.  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   HTN (hypertension)   Morbid obesity ([name])   Diastolic CHF, chronic ([name])   OSA (obstructive sleep apnea)   Hypoxia   Prolonged QT interval   AKI (acute kidney injury) (Jenna Chandler)  Acute Hypoxic Respiratory Failure 2/2 COVID 19 Pneumonia Unvaccinated  CXR with patchy  herterogenous bilateral airspace opacities in mid lower lung zone predominant distribution CT PE protocol, limited exam, no obvious large or central PE, multifocal pneumonia Plan to repeat CT scan today  Satting 93% on 3 L [place] -> wean as tolerated Continue remdesivir and solumedrol  I/O, daily weights Elevated inflammatory markers, will trend OOB, prone as able, IS, flutter, PT  COVID-19 Labs  Recent Labs    09/30/19 1745 09/30/19 1745 10/01/19 0457 10/02/19 0313 10/03/19 0501  DDIMER 2.04*  --   --  1.12* 0.62*  FERRITIN 314*   < > 361* 412* 351*  LDH 422*  --   --   --   --   CRP 9.4*   < > 11.2* 5.5* 1.7*   < > = values in this interval not displayed.    Lab Results  Component Value Date   SARSCOV2NAA POSITIVE (Jenna Chandler) 09/30/2019   Staph Epidermidis in 1 of 2 Blood Cx: suspected contaminant, follow results.  NGTD in second set.  Hemopytsis  Elevated D dimer: no evidence of VTE, unable to do VQ scan due to weight.  Will repeat CT scan today.  Will decrease lovenox to prophylactic dose with no evidence of VTE at this time.  Hemoptysis improving.    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  Holding HCTZ/arb with AKI.  Diastolic CHF, chronic (  Jenna Chandler) Continue home Lasix dose.   Prolonged QT interval QTc 516 QTc 8/25 -> 474, continue to monitor Continue to monitor, caution with QT prolonging meds   AKI (acute kidney injury) (Jenna Chandler) Improved, continue to monitor  Morbid obesity (Jenna Chandler) Chronic morbid obesity.  Patient to follow-up with PCP for dietary lifestyle interventions for weight loss.  Patient may also benefit from referral to bariatric surgery for evaluation  OSA (obstructive sleep apnea) Continue home as needed for OSA.  RT to follow  DVT prophylaxis: prophylactic lovenox Code Status: full  Family Communication: none at bedside Disposition:   Status is:  Inpatient  Remains inpatient appropriate because:Inpatient level of care appropriate due to severity of illness   Dispo: The patient is from: Home              Anticipated d/c is to: Home              Anticipated d/c date is: > 3 days              Patient currently is not medically stable to d/c.       Consultants:   none  Procedures: none  Antimicrobials:  Anti-infectives (From admission, onward)   Start     Dose/Rate Route Frequency Ordered Stop   10/01/19 1000  remdesivir 100 mg in sodium chloride 0.9 % 100 mL IVPB       "Followed by" Linked Group Details   100 mg 200 mL/hr over 30 Minutes Intravenous Daily 09/30/19 1954 10/05/19 0959   10/01/19 1000  remdesivir 100 mg in sodium chloride 0.9 % 100 mL IVPB  Status:  Discontinued       "Followed by" Linked Group Details   100 mg 200 mL/hr over 30 Minutes Intravenous Daily 09/30/19 2300 10/01/19 0028   09/30/19 2300  remdesivir 200 mg in sodium chloride 0.9% 250 mL IVPB  Status:  Discontinued       "Followed by" Linked Group Details   200 mg 580 mL/hr over 30 Minutes Intravenous Once 09/30/19 2300 10/01/19 0028   09/30/19 2030  remdesivir 200 mg in sodium chloride 0.9% 250 mL IVPB       "Followed by" Linked Group Details   200 mg 580 mL/hr over 30 Minutes Intravenous Once 09/30/19 1954 09/30/19 2143         Subjective: No new complaints Some bleeding at abdomen at site of lovenox injections  Objective: Vitals:   10/03/19 0446 10/03/19 0800 10/03/19 0900 10/03/19 1243  BP: 120/74 114/78  136/88  Pulse: 67 71 94 73  Resp: 20  18 20   Temp: 97.8 F (36.6 C)  98.4 F (36.9 C) 97.8 F (36.6 C)  TempSrc: Oral  Oral Oral  SpO2:  96% 95% 95%  Weight:      Height:        Intake/Output Summary (Last 24 hours) at 10/03/2019 1439 Last data filed at 10/03/2019 1100 Gross per 24 hour  Intake 960 ml  Output 800 ml  Net 160 ml   Filed Weights   09/30/19 1032  Weight: (!) 222.3 kg    Examination:  General:  No acute distress. Morbidly obese.  Cardiovascular: Heart sounds show Treasure Ochs regular rate, and rhythm Lungs: Clear to auscultation bilaterally Abdomen: Soft, nontender, nondistended Neurological: Alert and oriented 3. Moves all extremities 4. Cranial nerves II through XII grossly intact. Skin: Warm and dry. No rashes or lesions. Extremities: No LEE   Data Reviewed: I have personally reviewed following labs and imaging  studies  CBC: Recent Labs  Lab 09/30/19 1114 09/30/19 1745 10/01/19 0457 10/02/19 0313 10/03/19 0501  WBC 6.7 5.8 5.2 7.9 9.4  NEUTROABS  --  4.6 4.4 5.9 7.5  HGB 13.7 14.6 14.2 13.3 13.6  HCT 45.9 47.6* 46.6* 43.7 44.4  MCV 89.6 90.7 91.6 90.1 89.9  PLT 175 193 205 232 637    Basic Metabolic Panel: Recent Labs  Lab 09/30/19 1114 09/30/19 1745 10/01/19 0457 10/02/19 0313 10/03/19 0501  NA 137 136 135 137 141  K 3.3* 3.6 3.6 3.1* 4.2  CL 100 99 98 98 103  CO2 26 24 23 27 27   GLUCOSE 109* 105* 142* 191* 169*  BUN 12 13 17  23* 28*  CREATININE 1.10* 1.39* 1.19* 1.22* 1.10*  CALCIUM 7.7* 7.7* 7.8* 8.0* 8.1*  MG  --   --   --   --  2.3  PHOS  --   --   --   --  4.0    GFR: Estimated Creatinine Clearance: 111 mL/min (Froylan Hobby) (by C-G formula based on SCr of 1.1 mg/dL (H)).  Liver Function Tests: Recent Labs  Lab 09/30/19 1745 10/01/19 0457 10/02/19 0313 10/03/19 0501  AST 31 24 24 22   ALT 26 26 26 28   ALKPHOS 51 54 54 51  BILITOT 1.4* 0.7 0.5 0.6  PROT 6.2* 6.4* 6.1* 6.0*  ALBUMIN 2.8* 2.7* 2.5* 2.6*    CBG: Recent Labs  Lab 10/03/19 0849 10/03/19 1235  GLUCAP 157* 161*     Recent Results (from the past 240 hour(s))  SARS Coronavirus 2 by RT PCR (hospital order, performed in [facility] hospital lab) Nasopharyngeal Nasopharyngeal Swab     Status: Abnormal   Collection Time: 09/30/19  5:45 PM   Specimen: Nasopharyngeal Swab  Result Value Ref Range Status   SARS Coronavirus 2 POSITIVE (Harrietta Incorvaia) NEGATIVE Final    Comment: RESULT CALLED TO, READ  BACK BY AND VERIFIED WITH: D,SIDBURY @1935  09/30/19 EB (NOTE) SARS-CoV-2 target nucleic acids are DETECTED  SARS-CoV-2 RNA is generally detectable in upper respiratory specimens  during the acute phase of infection.  Positive results are indicative  of the presence of the identified virus, but do not rule out bacterial infection or co-infection with other pathogens not detected by the test.  Clinical correlation with patient history and  other diagnostic information is necessary to determine patient infection status.  The expected result is negative.  Fact Sheet for Patients:   StrictlyIdeas.no   Fact Sheet for Healthcare Providers:   BankingDealers.co.za    This test is not yet approved or cleared by the [place] FDA and  has been authorized for detection and/or diagnosis of SARS-CoV-2 by FDA under an Emergency Use Authorization (EUA).  This EUA will remain in effect (meaning this test can b e used) for the duration of  the COVID-19 declaration under Section 564(b)(1) of the Act, 21 U.S.C. section 360-bbb-3(b)(1), unless the authorization is terminated or revoked sooner.  Performed at [facility], [place] [address] [place], [place] [address]   Blood Culture (routine x 2)     Status: Abnormal   Collection Time: 09/30/19  5:58 PM   Specimen: BLOOD  Result Value Ref Range Status   Specimen Description BLOOD SITE NOT SPECIFIED  Final   Special Requests   Final    BOTTLES DRAWN AEROBIC AND ANAEROBIC Blood Culture adequate volume   Culture  Setup Time   Final    GRAM POSITIVE COCCI IN CLUSTERS AEROBIC BOTTLE  ONLY Organism ID to follow CRITICAL RESULT CALLED TO, READ BACK BY AND VERIFIED WITH: J LEDFORD [facility] 10/01/19 2319 JDW    Culture (Emitt Maglione)  Final    STAPHYLOCOCCUS EPIDERMIDIS THE SIGNIFICANCE OF ISOLATING THIS ORGANISM FROM Avelynn Sellin SINGLE SET OF BLOOD CULTURES WHEN MULTIPLE SETS ARE DRAWN IS UNCERTAIN. PLEASE NOTIFY THE  MICROBIOLOGY DEPARTMENT WITHIN ONE WEEK IF SPECIATION AND SENSITIVITIES ARE REQUIRED. Performed at [facility], [place] [address] [place], [place] [address]    Report Status 10/03/2019 FINAL  Final  Blood Culture ID Panel (Reflexed)     Status: Abnormal   Collection Time: 09/30/19  5:58 PM  Result Value Ref Range Status   Enterococcus faecalis NOT DETECTED NOT DETECTED Final   Enterococcus Faecium NOT DETECTED NOT DETECTED Final   Listeria monocytogenes NOT DETECTED NOT DETECTED Final   Staphylococcus species DETECTED (Fowler Antos) NOT DETECTED Final   Staphylococcus aureus (BCID) NOT DETECTED NOT DETECTED Final   Staphylococcus epidermidis DETECTED (Jaquese Irving) NOT DETECTED Final    Comment: Methicillin (oxacillin) resistant coagulase negative staphylococcus. Possible blood culture contaminant (unless isolated from more than one blood culture draw or clinical case suggests pathogenicity). No antibiotic treatment is indicated for blood  culture contaminants. CRITICAL RESULT CALLED TO, READ BACK BY AND VERIFIED WITH: J LEDFORD [facility] 10/01/19 2319 JDW    Staphylococcus lugdunensis NOT DETECTED NOT DETECTED Final   Streptococcus species NOT DETECTED NOT DETECTED Final   Streptococcus agalactiae NOT DETECTED NOT DETECTED Final   Streptococcus pneumoniae NOT DETECTED NOT DETECTED Final   Streptococcus pyogenes NOT DETECTED NOT DETECTED Final   Reason Helzer.calcoaceticus-baumannii NOT DETECTED NOT DETECTED Final   Bacteroides fragilis NOT DETECTED NOT DETECTED Final   Enterobacterales NOT DETECTED NOT DETECTED Final   Enterobacter cloacae complex NOT DETECTED NOT DETECTED Final   Escherichia coli NOT DETECTED NOT DETECTED Final   Klebsiella aerogenes NOT DETECTED NOT DETECTED Final   Klebsiella oxytoca NOT DETECTED NOT DETECTED Final   Klebsiella pneumoniae NOT DETECTED NOT DETECTED Final   Proteus species NOT DETECTED NOT DETECTED Final   Salmonella species NOT DETECTED NOT DETECTED Final   Serratia marcescens  NOT DETECTED NOT DETECTED Final   Haemophilus influenzae NOT DETECTED NOT DETECTED Final   Neisseria meningitidis NOT DETECTED NOT DETECTED Final   Pseudomonas aeruginosa NOT DETECTED NOT DETECTED Final   Stenotrophomonas maltophilia NOT DETECTED NOT DETECTED Final   Candida albicans NOT DETECTED NOT DETECTED Final   Candida auris NOT DETECTED NOT DETECTED Final   Candida glabrata NOT DETECTED NOT DETECTED Final   Candida krusei NOT DETECTED NOT DETECTED Final   Candida parapsilosis NOT DETECTED NOT DETECTED Final   Candida tropicalis NOT DETECTED NOT DETECTED Final   Cryptococcus neoformans/gattii NOT DETECTED NOT DETECTED Final   Methicillin resistance mecA/C DETECTED (Kalliopi Coupland) NOT DETECTED Final    Comment: CRITICAL RESULT CALLED TO, READ BACK BY AND VERIFIED WITHKarsten Ro [facility] 10/01/19 2319 JDW Performed at [facility], [address], [place], [place] [address]   Blood Culture (routine x 2)     Status: None (Preliminary result)   Collection Time: 09/30/19  6:01 PM   Specimen: BLOOD  Result Value Ref Range Status   Specimen Description BLOOD SITE NOT SPECIFIED  Final   Special Requests   Final    BOTTLES DRAWN AEROBIC ONLY Blood Culture adequate volume   Culture   Final    NO GROWTH 3 DAYS Performed at [facility], [address] [place], [place] [address]  Report Status PENDING  Incomplete         Radiology Studies: DG Chest Portable 1 View  Result Date: 10/01/2019 CLINICAL DATA:  History of COVID-19 positivity EXAM: PORTABLE CHEST 1 VIEW COMPARISON:  09/30/2019 FINDINGS: Cardiac shadow is within normal limits. The lungs are well aerated bilaterally. Patchy airspace opacities are noted right greater than left consistent with the given clinical history of COVID-19 positivity. IMPRESSION: Changes consistent with bilateral COVID pneumonia. Electronically Signed   By: Inez Catalina M.D.   On: 10/01/2019 21:37        Scheduled Meds: . amLODipine  10 mg Oral  Daily  . vitamin C  500 mg Oral Daily  . aspirin EC  81 mg Oral Daily  . cloNIDine  0.2 mg Oral BID  . enoxaparin (LOVENOX) injection  120 mg Subcutaneous Q24H  . furosemide  80 mg Oral BID  . insulin aspart  0-9 Units Subcutaneous TID WC  . methylPREDNISolone (SOLU-MEDROL) injection  0.5 mg/kg Intravenous Q12H  . mometasone-formoterol  2 puff Inhalation BID  . zinc sulfate  220 mg Oral Daily   Continuous Infusions: . remdesivir 100 mg in NS 100 mL 100 mg (10/03/19 0913)     LOS: 3 days    Time spent: over 30 min    Fayrene Helper, MD [facility]   To contact the attending provider between 7A-7P or the covering provider during after hours 7P-7A, please log into the web site [email or web address] and access using universal [place] password for that web site. If you do not have the password, please call the hospital operator.  10/03/2019, 2:39 PM

## 2019-10-03 NOTE — Progress Notes (Signed)
ANTICOAGULATION CONSULT NOTE - Initial Consult  Pharmacy Consult for Lovenox  Indication: VTE prophylaxis  Allergies  Allergen Reactions  . Lisinopril Cough  . Levaquin [Levofloxacin]     Nauseated     Patient Measurements: Height: 5\' 4"  (162.6 cm) Weight: (!) 222.3 kg (490 lb) IBW/kg (Calculated) : 54.7   Vital Signs: Temp: 98.4 F (36.9 C) (08/27 0900) Temp Source: Oral (08/27 0900) BP: 114/78 (08/27 0800) Pulse Rate: 94 (08/27 0900)  Labs: Recent Labs    09/30/19 1114 09/30/19 1711 09/30/19 1745 10/01/19 0457 10/01/19 0457 10/02/19 0313 10/03/19 0501  HGB 13.7  --    < > 14.2   < > 13.3 13.6  HCT 45.9  --    < > 46.6*  --  43.7 44.4  PLT 175  --    < > 205  --  232 279  CREATININE 1.10*  --    < > 1.19*  --  1.22* 1.10*  TROPONINIHS 19* 19*  --   --   --   --   --    < > = values in this interval not displayed.    Estimated Creatinine Clearance: 111 mL/min (A) (by C-G formula based on SCr of 1.1 mg/dL (H)).   Medical History: Past Medical History:  Diagnosis Date  . Anxiety   . Asthma   . Chronic diastolic CHF (congestive heart failure) ([place])   . Chronic kidney disease   . COPD (chronic obstructive pulmonary disease) ([name])    +/- asthma   . Depression   . Diabetes mellitus without complication ([place])   . Dyspnea   . Enlarged heart   . Fibromyalgia   . Hypertension   . Manic, depressive ([place])   . Morbid obesity ([place])   . OSA (obstructive sleep apnea)       Assessment: 54 y.o female admitted 09/30/19 for acute hypoxic respiratory failure 2/2 COVID 19 pneumonia.  Elevated d-dimer, she was started on full dose lovenox due to concern for pulmonary embolus.   D-dimer has trended down, today is  <1  8/25 bilat venous doppler: negative  8/26  CT angio not adequate to r/o PE, no obivious large or central PE,   recommended VQ scan   Today, pharmacy consulted to dose Lovenox for VTE prophylaxis.  Will reduce Lovenox dose to  0.5 mg/kg q24h.  Weight  =222 kg, BMI 84, CrCl >30 ml/min.  CBC within normal limits.  She last received Lovenox 222 mg sq full dose at 00:40 827/21.   Goal of Therapy:  Prevention of VTE Monitor platelets by anticoagulation protocol: Yes   Plan:  Decrease Lovenox to 120 mg sq Q24h  (~0.5 mg/kg q24 hours) Monitor for bleeding  Thank you for allowing pharmacy to be part of this patients care team.  Nicole Cella, [place] Pharmacist [phone] Please check AMION for all [place] phone numbers After 10:00 PM, call [place] [phone]  10/03/2019,10:27 AM

## 2019-10-04 ENCOUNTER — Inpatient Hospital Stay (HOSPITAL_COMMUNITY): Payer: Medicaid Other

## 2019-10-04 LAB — FERRITIN: Ferritin: 278 ng/mL (ref 11–307)

## 2019-10-04 LAB — BLOOD CULTURE ID PANEL (REFLEXED) - BCID2

## 2019-10-04 LAB — CBC WITH DIFFERENTIAL/PLATELET
Abs Immature Granulocytes: 0.24 10*3/uL — ABNORMAL HIGH (ref 0.00–0.07)
Basophils Absolute: 0.1 10*3/uL (ref 0.0–0.1)
Basophils Relative: 1 %
Eosinophils Absolute: 0.2 10*3/uL (ref 0.0–0.5)
Eosinophils Relative: 2 %
HCT: 44.6 % (ref 36.0–46.0)
Hemoglobin: 13.6 g/dL (ref 12.0–15.0)
Immature Granulocytes: 2 %
Lymphocytes Relative: 13 %
Lymphs Abs: 1.3 10*3/uL (ref 0.7–4.0)
MCH: 27.3 pg (ref 26.0–34.0)
MCHC: 30.5 g/dL (ref 30.0–36.0)
MCV: 89.4 fL (ref 80.0–100.0)
Monocytes Absolute: 0.7 10*3/uL (ref 0.1–1.0)
Monocytes Relative: 7 %
Neutro Abs: 7.4 10*3/uL (ref 1.7–7.7)
Neutrophils Relative %: 75 %
Platelets: 299 10*3/uL (ref 150–400)
RBC: 4.99 MIL/uL (ref 3.87–5.11)
RDW: 14 % (ref 11.5–15.5)
WBC: 9.9 10*3/uL (ref 4.0–10.5)
nRBC: 0.3 % — ABNORMAL HIGH (ref 0.0–0.2)

## 2019-10-04 LAB — D-DIMER, QUANTITATIVE: D-Dimer, Quant: 0.61 ug/mL-FEU — ABNORMAL HIGH (ref 0.00–0.50)

## 2019-10-04 LAB — COMPREHENSIVE METABOLIC PANEL
ALT: 39 U/L (ref 0–44)
AST: 29 U/L (ref 15–41)
Albumin: 2.6 g/dL — ABNORMAL LOW (ref 3.5–5.0)
Alkaline Phosphatase: 51 U/L (ref 38–126)
Anion gap: 10 (ref 5–15)
BUN: 27 mg/dL — ABNORMAL HIGH (ref 6–20)
CO2: 27 mmol/L (ref 22–32)
Calcium: 7.9 mg/dL — ABNORMAL LOW (ref 8.9–10.3)
Chloride: 101 mmol/L (ref 98–111)
Creatinine, Ser: 0.99 mg/dL (ref 0.44–1.00)
GFR calc Af Amer: >60 mL/min (ref >60)
GFR calc non Af Amer: >60 mL/min (ref >60)
Glucose, Bld: 199 mg/dL — ABNORMAL HIGH (ref 70–99)
Potassium: 3.9 mmol/L (ref 3.5–5.1)
Sodium: 138 mmol/L (ref 135–145)
Total Bilirubin: 0.7 mg/dL (ref 0.3–1.2)
Total Protein: 5.9 g/dL — ABNORMAL LOW (ref 6.5–8.1)

## 2019-10-04 LAB — PHOSPHORUS: Phosphorus: 3.8 mg/dL (ref 2.5–4.6)

## 2019-10-04 LAB — HEMOGLOBIN A1C
Hgb A1c MFr Bld: 6.5 % — ABNORMAL HIGH (ref 4.8–5.6)
Mean Plasma Glucose: 139.85 mg/dL

## 2019-10-04 LAB — GLUCOSE, CAPILLARY
Glucose-Capillary: 203 mg/dL — ABNORMAL HIGH (ref 70–99)
Glucose-Capillary: 195 mg/dL — ABNORMAL HIGH (ref 70–99)
Glucose-Capillary: 217 mg/dL — ABNORMAL HIGH (ref 70–99)
Glucose-Capillary: 161 mg/dL — ABNORMAL HIGH (ref 70–99)

## 2019-10-04 LAB — C-REACTIVE PROTEIN: CRP: 1 mg/dL — ABNORMAL HIGH (ref <1.0)

## 2019-10-04 LAB — MAGNESIUM: Magnesium: 2.1 mg/dL (ref 1.7–2.4)

## 2019-10-04 MED ORDER — IOHEXOL 350 MG/ML SOLN
100.0000 mL | Freq: Once | INTRAVENOUS | Status: AC | PRN
  Administered 2019-10-04: 100 mL via INTRAVENOUS

## 2019-10-04 MED ORDER — INSULIN ASPART 100 UNIT/ML ~~LOC~~ SOLN: 0.0000 [IU] | Freq: Three times a day (TID) | SUBCUTANEOUS | Status: DC

## 2019-10-04 NOTE — Progress Notes (Signed)
SATURATION QUALIFICATIONS: (This note is used to comply with regulatory documentation for home oxygen)  Patient Saturations on Room Air at Rest = 95%  Patient Saturations on Room Air while Ambulating = 94%  Please briefly explain why patient needs home oxygen: N/A Pt does not meet qualifications

## 2019-10-04 NOTE — Progress Notes (Signed)
PROGRESS NOTE    Jenna Chandler  WUJ:[id number] DOB: 1965/03/18 DOA: 09/30/2019 PCP: Nolene Ebbs, MD   No chief complaint on file.   Brief Narrative:  Jenna Chandler is Jenna Chandler 54 y.o. female with medical history significant for COPD/asthma, hypertension, chronic diastolic CHF, depression/anxiety, OSA, morbid obesity who presents to the hospital complaining of shortness of breath with cough.  She reports she has been feeling bad since August 13.  She was tested for Covid at [facility] on 16 August and was told she was positive on 18 August.  She has not been treated with any antiviral or monoclonal antibodies as an outpatient.  She was seen by her pulmonologist yesterday and was prescribed an antibiotic.  Not sure which one.  Today she had some hard coughing with some mild blood-tinged sputum.  Had no gross hemoptysis.  She has had subjective fever and chills.  Denies any nausea, vomiting or diarrhea.  She does have generalized body aches.  She does have Jenna Chandler history of fibromyalgia but body aches seem more pronounced than her normal.  She used Tylenol and Motrin at home but with her worsening shortness of breath she came to the emergency room. Her brother and his wife have been living with her for the last week in the day they moved and they started coughing she thinks they were diagnosed with Covid Jenna Chandler few days ago.  She does not use oxygen at home.  She does have Jenna Chandler history of OSA and uses CPAP at night. History of smoking but quit in 2014.  She denies any alcohol or illicit drug use.  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   HTN (hypertension)   Morbid obesity ([name])   Diastolic CHF, chronic ([name])   OSA (obstructive sleep apnea)   Hypoxia   Prolonged QT interval   AKI (acute kidney injury) ([place])  Acute Hypoxic Respiratory Failure 2/2 COVID 19 Pneumonia Unvaccinated  CXR with patchy  herterogenous bilateral airspace opacities in mid lower lung zone predominant distribution CT PE protocol, limited exam, no obvious large or central PE, multifocal pneumonia Plan to repeat CT scan today  Currently on RA, but dyspneic with minimal exertion, hopefully can d/c in AM  Continue remdesivir and solumedrol  I/O, daily weights Elevated inflammatory markers, will trend OOB, prone as able, IS, flutter, PT  COVID-19 Labs  Recent Labs    10/02/19 0313 10/03/19 0501 10/04/19 0122  DDIMER 1.12* 0.62* 0.61*  FERRITIN 412* 351* 278  CRP 5.5* 1.7* 1.0*    Lab Results  Component Value Date   SARSCOV2NAA POSITIVE (Jenna Chandler) 09/30/2019   Staph Epidermidis in 1 of 2 Blood Cx: suspected contaminant, follow results.  NGTD in second set.  Hemopytsis  Elevated D dimer: no evidence of VTE on LE [place] or CT PE protocol x2.  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  Holding HCTZ/arb with AKI.  Diastolic CHF, chronic ([name]) Continue home Lasix dose.   Prolonged QT interval QTc 516 QTc 8/25 -> 474, continue to monitor Continue to monitor, caution with QT prolonging meds   AKI (acute kidney injury) ([place]) Improved, continue to monitor  Morbid obesity ([place]) Chronic morbid obesity.  Patient to follow-up with PCP for dietary lifestyle interventions for weight loss.  Patient may also benefit from referral to bariatric surgery  for evaluation  OSA (obstructive sleep apnea) Continue home as needed for OSA.  RT to follow  DVT prophylaxis: prophylactic lovenox Code Status: full  Family Communication: none at bedside Disposition:   Status is: Inpatient  Remains inpatient appropriate because:Inpatient level of care appropriate due to severity of illness   Dispo: The patient is from: Home              Anticipated d/c is to: Home              Anticipated d/c date is: > 3 days               Patient currently is not medically stable to d/c.       Consultants:   none  Procedures: none  Antimicrobials:  Anti-infectives (From admission, onward)   Start     Dose/Rate Route Frequency Ordered Stop   10/01/19 1000  remdesivir 100 mg in sodium chloride 0.9 % 100 mL IVPB       "Followed by" Linked Group Details   100 mg 200 mL/hr over 30 Minutes Intravenous Daily 09/30/19 1954 10/04/19 0941   10/01/19 1000  remdesivir 100 mg in sodium chloride 0.9 % 100 mL IVPB  Status:  Discontinued       "Followed by" Linked Group Details   100 mg 200 mL/hr over 30 Minutes Intravenous Daily 09/30/19 2300 10/01/19 0028   09/30/19 2300  remdesivir 200 mg in sodium chloride 0.9% 250 mL IVPB  Status:  Discontinued       "Followed by" Linked Group Details   200 mg 580 mL/hr over 30 Minutes Intravenous Once 09/30/19 2300 10/01/19 0028   09/30/19 2030  remdesivir 200 mg in sodium chloride 0.9% 250 mL IVPB       "Followed by" Linked Group Details   200 mg 580 mL/hr over 30 Minutes Intravenous Once 09/30/19 1954 09/30/19 2143         Subjective: No new complaints Doesn't feel ready for discharge  Objective: Vitals:   10/04/19 0421 10/04/19 0744 10/04/19 0845 10/04/19 1203  BP: 133/90 (!) 143/96  (!) 153/77  Pulse: 74   77  Resp: 20 17  20   Temp: 98.1 F (36.7 C) 98 F (36.7 C)  98.1 F (36.7 C)  TempSrc: Oral Oral  Oral  SpO2: 92% 90% 96% 96%  Weight:      Height:        Intake/Output Summary (Last 24 hours) at 10/04/2019 1625 Last data filed at 10/04/2019 1345 Gross per 24 hour  Intake 830 ml  Output 2150 ml  Net -1320 ml   Filed Weights   09/30/19 1032  Weight: (!) 222.3 kg    Examination:  General: No acute distress. Obese  Cardiovascular: Heart sounds show Jenna Chandler regular rate, and rhythm. Lungs: unlabored Abdomen: Soft, nontender, nondistended  Neurological: Alert and oriented 3. Moves all extremities 4. Cranial nerves II through XII grossly intact. Skin:  Warm and dry. No rashes or lesions. Extremities: No clubbing or cyanosis. No edema.    Data Reviewed: I have personally reviewed following labs and imaging studies  CBC: Recent Labs  Lab 09/30/19 1745 10/01/19 0457 10/02/19 0313 10/03/19 0501 10/04/19 0122  WBC 5.8 5.2 7.9 9.4 9.9  NEUTROABS 4.6 4.4 5.9 7.5 7.4  HGB 14.6 14.2 13.3 13.6 13.6  HCT 47.6* 46.6* 43.7 44.4 44.6  MCV 90.7 91.6 90.1 89.9 89.4  PLT 193 205 232 279 542    Basic Metabolic Panel: Recent Labs  Lab 09/30/19 1745 10/01/19 0457 10/02/19 0313 10/03/19 0501 10/04/19 0122  NA 136 135 137 141 138  K 3.6 3.6 3.1* 4.2 3.9  CL 99 98 98 103 101  CO2 24 23 27 27 27   GLUCOSE 105* 142* 191* 169* 199*  BUN 13 17 23* 28* 27*  CREATININE 1.39* 1.19* 1.22* 1.10* 0.99  CALCIUM 7.7* 7.8* 8.0* 8.1* 7.9*  MG  --   --   --  2.3 2.1  PHOS  --   --   --  4.0 3.8    GFR: Estimated Creatinine Clearance: 123.4 mL/min (by C-G formula based on SCr of 0.99 mg/dL).  Liver Function Tests: Recent Labs  Lab 09/30/19 1745 10/01/19 0457 10/02/19 0313 10/03/19 0501 10/04/19 0122  AST 31 24 24 22 29   ALT 26 26 26 28  39  ALKPHOS 51 54 54 51 51  BILITOT 1.4* 0.7 0.5 0.6 0.7  PROT 6.2* 6.4* 6.1* 6.0* 5.9*  ALBUMIN 2.8* 2.7* 2.5* 2.6* 2.6*    CBG: Recent Labs  Lab 10/03/19 1235 10/03/19 1642 10/03/19 2112 10/04/19 0744 10/04/19 1206  GLUCAP 161* 203* 184* 203* 195*     Recent Results (from the past 240 hour(s))  SARS Coronavirus 2 by RT PCR (hospital order, performed in [facility] hospital lab) Nasopharyngeal Nasopharyngeal Swab     Status: Abnormal   Collection Time: 09/30/19  5:45 PM   Specimen: Nasopharyngeal Swab  Result Value Ref Range Status   SARS Coronavirus 2 POSITIVE (Harumi Yamin) NEGATIVE Final    Comment: RESULT CALLED TO, READ BACK BY AND VERIFIED WITH: D,SIDBURY @1935  09/30/19 EB (NOTE) SARS-CoV-2 target nucleic acids are DETECTED  SARS-CoV-2 RNA is generally detectable in upper respiratory specimens   during the acute phase of infection.  Positive results are indicative  of the presence of the identified virus, but do not rule out bacterial infection or co-infection with other pathogens not detected by the test.  Clinical correlation with patient history and  other diagnostic information is necessary to determine patient infection status.  The expected result is negative.  Fact Sheet for Patients:   StrictlyIdeas.no   Fact Sheet for Healthcare Providers:   BankingDealers.co.za    This test is not yet approved or cleared by the [place] FDA and  has been authorized for detection and/or diagnosis of SARS-CoV-2 by FDA under an Emergency Use Authorization (EUA).  This EUA will remain in effect (meaning this test can b e used) for the duration of  the COVID-19 declaration under Section 564(b)(1) of the Act, 21 U.S.C. section 360-bbb-3(b)(1), unless the authorization is terminated or revoked sooner.  Performed at [facility], [place] [address], [place], [place] [address]   Blood Culture (routine x 2)     Status: Abnormal   Collection Time: 09/30/19  5:58 PM   Specimen: BLOOD  Result Value Ref Range Status   Specimen Description BLOOD SITE NOT SPECIFIED  Final   Special Requests   Final    BOTTLES DRAWN AEROBIC AND ANAEROBIC Blood Culture adequate volume   Culture  Setup Time   Final    GRAM POSITIVE COCCI IN CLUSTERS AEROBIC BOTTLE ONLY Organism ID to follow CRITICAL RESULT CALLED TO, READ BACK BY AND VERIFIED WITH: J LEDFORD PHARMD 10/01/19 2319 JDW    Culture (Kelly Ranieri)  Final    STAPHYLOCOCCUS EPIDERMIDIS THE SIGNIFICANCE OF ISOLATING THIS ORGANISM FROM Burlie Cajamarca SINGLE SET OF BLOOD CULTURES WHEN MULTIPLE SETS ARE DRAWN IS UNCERTAIN. PLEASE NOTIFY THE MICROBIOLOGY DEPARTMENT WITHIN  ONE WEEK IF SPECIATION AND SENSITIVITIES ARE REQUIRED. Performed at [facility], [place] [address], [place], [place] [address]    Report Status  10/03/2019 FINAL  Final  Blood Culture ID Panel (Reflexed)     Status: Abnormal   Collection Time: 09/30/19  5:58 PM  Result Value Ref Range Status   Enterococcus faecalis NOT DETECTED NOT DETECTED Final   Enterococcus Faecium NOT DETECTED NOT DETECTED Final   Listeria monocytogenes NOT DETECTED NOT DETECTED Final   Staphylococcus species DETECTED (Florenda Watt) NOT DETECTED Final    Comment: CRITICAL RESULT CALLED TO, READ BACK BY AND VERIFIED WITH: J LEDFORD PHARMD 10/01/19 2319 JDW    Staphylococcus aureus (BCID) NOT DETECTED NOT DETECTED Final   Staphylococcus epidermidis DETECTED (Beda Dula) NOT DETECTED Final    Comment: Methicillin (oxacillin) resistant coagulase negative staphylococcus. Possible blood culture contaminant (unless isolated from more than one blood culture draw or clinical case suggests pathogenicity). No antibiotic treatment is indicated for blood  culture contaminants. CRITICAL RESULT CALLED TO, READ BACK BY AND VERIFIED WITH: J LEDFORD PHARMD 10/01/19 2319 JDW    Staphylococcus lugdunensis NOT DETECTED NOT DETECTED Final   Streptococcus species NOT DETECTED NOT DETECTED Final   Streptococcus agalactiae NOT DETECTED NOT DETECTED Final   Streptococcus pneumoniae NOT DETECTED NOT DETECTED Final   Streptococcus pyogenes NOT DETECTED NOT DETECTED Final   Aryiana Klinkner.calcoaceticus-baumannii NOT DETECTED NOT DETECTED Final   Bacteroides fragilis NOT DETECTED NOT DETECTED Final   Enterobacterales NOT DETECTED NOT DETECTED Final   Enterobacter cloacae complex NOT DETECTED NOT DETECTED Final   Escherichia coli NOT DETECTED NOT DETECTED Final   Klebsiella aerogenes NOT DETECTED NOT DETECTED Final   Klebsiella oxytoca NOT DETECTED NOT DETECTED Final   Klebsiella pneumoniae NOT DETECTED NOT DETECTED Final   Proteus species NOT DETECTED NOT DETECTED Final   Salmonella species NOT DETECTED NOT DETECTED Final   Serratia marcescens NOT DETECTED NOT DETECTED Final   Haemophilus influenzae NOT DETECTED NOT  DETECTED Final   Neisseria meningitidis NOT DETECTED NOT DETECTED Final   Pseudomonas aeruginosa NOT DETECTED NOT DETECTED Final   Stenotrophomonas maltophilia NOT DETECTED NOT DETECTED Final   Candida albicans NOT DETECTED NOT DETECTED Final   Candida auris NOT DETECTED NOT DETECTED Final   Candida glabrata NOT DETECTED NOT DETECTED Final   Candida krusei NOT DETECTED NOT DETECTED Final   Candida parapsilosis NOT DETECTED NOT DETECTED Final   Candida tropicalis NOT DETECTED NOT DETECTED Final   Cryptococcus neoformans/gattii NOT DETECTED NOT DETECTED Final   Methicillin resistance mecA/C DETECTED (Elisah Parmer) NOT DETECTED Final    Comment: CRITICAL RESULT CALLED TO, READ BACK BY AND VERIFIED WITHKarsten Ro [facility] 10/01/19 2319 JDW Performed at [facility], [address] [place], [place] [address]   Blood Culture (routine x 2)     Status: None (Preliminary result)   Collection Time: 09/30/19  6:01 PM   Specimen: BLOOD  Result Value Ref Range Status   Specimen Description BLOOD SITE NOT SPECIFIED  Final   Special Requests   Final    BOTTLES DRAWN AEROBIC ONLY Blood Culture adequate volume   Culture   Final    NO GROWTH 4 DAYS Performed at [facility], [place] [address], [place], [place] [address]    Report Status PENDING  Incomplete         Radiology Studies: CT ANGIO CHEST PE W OR WO CONTRAST  Result Date: 10/04/2019 CLINICAL DATA:  Evaluate for pulmonary embolus.  Positive  D-dimer. EXAM: CT ANGIOGRAPHY CHEST WITH CONTRAST TECHNIQUE: Multidetector CT imaging of the chest was performed using the standard protocol during bolus administration of intravenous contrast. Multiplanar CT image reconstructions and MIPs were obtained to evaluate the vascular anatomy. CONTRAST:  139mL OMNIPAQUE IOHEXOL 350 MG/ML SOLN COMPARISON:  09/30/2019 FINDINGS: Cardiovascular: The main pulmonary artery is patent. No central obstructing embolus. Exam is mildly limited due to respiratory motion  artifact. There is also sub optimal pulmonary arterial opacification at the level of the segmental and subsegmental pulmonary arteries. No central obstructing pulmonary embolus. No lobar pulmonary artery filling defects identified. Heart size appears normal. Small pericardial effusion noted. Mediastinum/Nodes: Normal appearance of the thyroid gland. The trachea appears patent and is midline. Normal appearance of the esophagus. No enlarged axillary, supraclavicular, or mediastinal lymph nodes. Right hilar lymph node measures 1.3 cm, image 116/6. Lungs/Pleura: No pleural effusions. Again seen are bilateral, multifocal patchy areas of ground-glass and airspace densities within upper lung zone predilection. When compared with the previous exam there is been no significant interval progression. In fact, the opacities within the lower lobes appear improved in the interval. Subpleural bleb within the posteromedial aspect of the right lower lobe is unchanged measuring 2.8 cm. Upper Abdomen: No acute abnormality within the imaged portions of the upper abdomen. Musculoskeletal: Multilevel degenerative disc disease. Review of the MIP images confirms the above findings. IMPRESSION: 1. Exam detail is diminished secondary to respiratory motion artifact and sub optimal pulmonary arterial opacification. Within this limitation there is no evidence for acute pulmonary embolus up to the level of the lobar pulmonary arteries. 2. Bilateral, multifocal patchy areas of ground-glass and airspace densities are again noted compatible with COVID pneumonia. When compared with the previous exam the opacities within the lower lobes appear improved in the interval. 3. Small pericardial effusion. Electronically Signed   By: Kerby Moors M.D.   On: 10/04/2019 07:03        Scheduled Meds: . amLODipine  10 mg Oral Daily  . vitamin C  500 mg Oral Daily  . aspirin EC  81 mg Oral Daily  . cloNIDine  0.2 mg Oral BID  . enoxaparin (LOVENOX)  injection  120 mg Subcutaneous Q24H  . furosemide  80 mg Oral BID  . insulin aspart  0-9 Units Subcutaneous TID WC  . methylPREDNISolone (SOLU-MEDROL) injection  0.5 mg/kg Intravenous Q12H  . mometasone-formoterol  2 puff Inhalation BID  . zinc sulfate  220 mg Oral Daily   Continuous Infusions:    LOS: 4 days    Time spent: over 30 min    Fayrene Helper, MD [facility]   To contact the attending provider between 7A-7P or the covering provider during after hours 7P-7A, please log into the web site [email or web address] and access using universal [place] password for that web site. If you do not have the password, please call the hospital operator.  10/04/2019, 4:25 PM

## 2019-10-05 LAB — COMPREHENSIVE METABOLIC PANEL
ALT: 66 U/L — ABNORMAL HIGH (ref 0–44)
AST: 44 U/L — ABNORMAL HIGH (ref 15–41)
Albumin: 2.7 g/dL — ABNORMAL LOW (ref 3.5–5.0)
Alkaline Phosphatase: 45 U/L (ref 38–126)
Anion gap: 11 (ref 5–15)
BUN: 24 mg/dL — ABNORMAL HIGH (ref 6–20)
CO2: 29 mmol/L (ref 22–32)
Calcium: 7.8 mg/dL — ABNORMAL LOW (ref 8.9–10.3)
Chloride: 100 mmol/L (ref 98–111)
Creatinine, Ser: 0.92 mg/dL (ref 0.44–1.00)
GFR calc Af Amer: >60 mL/min (ref >60)
GFR calc non Af Amer: >60 mL/min (ref >60)
Glucose, Bld: 181 mg/dL — ABNORMAL HIGH (ref 70–99)
Potassium: 4.1 mmol/L (ref 3.5–5.1)
Sodium: 140 mmol/L (ref 135–145)
Total Bilirubin: 0.9 mg/dL (ref 0.3–1.2)
Total Protein: 5.8 g/dL — ABNORMAL LOW (ref 6.5–8.1)

## 2019-10-05 LAB — CBC WITH DIFFERENTIAL/PLATELET
Abs Immature Granulocytes: 0.18 10*3/uL — ABNORMAL HIGH (ref 0.00–0.07)
Basophils Absolute: 0 10*3/uL (ref 0.0–0.1)
Basophils Relative: 0 %
Eosinophils Absolute: 0 10*3/uL (ref 0.0–0.5)
Eosinophils Relative: 0 %
HCT: 44.2 % (ref 36.0–46.0)
Hemoglobin: 13.4 g/dL (ref 12.0–15.0)
Immature Granulocytes: 2 %
Lymphocytes Relative: 16 %
Lymphs Abs: 1.4 10*3/uL (ref 0.7–4.0)
MCH: 26.7 pg (ref 26.0–34.0)
MCHC: 30.3 g/dL (ref 30.0–36.0)
MCV: 88.2 fL (ref 80.0–100.0)
Monocytes Absolute: 0.6 10*3/uL (ref 0.1–1.0)
Monocytes Relative: 6 %
Neutro Abs: 6.8 10*3/uL (ref 1.7–7.7)
Neutrophils Relative %: 76 %
Platelets: 295 10*3/uL (ref 150–400)
RBC: 5.01 MIL/uL (ref 3.87–5.11)
RDW: 14 % (ref 11.5–15.5)
WBC: 8.9 10*3/uL (ref 4.0–10.5)
nRBC: 0.3 % — ABNORMAL HIGH (ref 0.0–0.2)

## 2019-10-05 LAB — D-DIMER, QUANTITATIVE: D-Dimer, Quant: 0.47 ug/mL-FEU (ref 0.00–0.50)

## 2019-10-05 LAB — GLUCOSE, CAPILLARY
Glucose-Capillary: 217 mg/dL — ABNORMAL HIGH (ref 70–99)
Glucose-Capillary: 207 mg/dL — ABNORMAL HIGH (ref 70–99)
Glucose-Capillary: 215 mg/dL — ABNORMAL HIGH (ref 70–99)

## 2019-10-05 LAB — CULTURE, BLOOD (ROUTINE X 2)
Culture: NO GROWTH
Special Requests: ADEQUATE

## 2019-10-05 LAB — FERRITIN: Ferritin: 255 ng/mL (ref 11–307)

## 2019-10-05 LAB — PHOSPHORUS: Phosphorus: 4.9 mg/dL — ABNORMAL HIGH (ref 2.5–4.6)

## 2019-10-05 LAB — C-REACTIVE PROTEIN: CRP: 0.6 mg/dL (ref <1.0)

## 2019-10-05 LAB — MAGNESIUM: Magnesium: 2.2 mg/dL (ref 1.7–2.4)

## 2019-10-05 NOTE — Discharge Summary (Signed)
Physician Discharge Summary  Jenna Chandler IRC:[id number] DOB: 05-27-1965 DOA: 09/30/2019  PCP: Jenna Ebbs, MD  Admit date: 09/30/2019 Discharge date: 10/05/2019  Time spent: 40 minutes  Recommendations for Outpatient Follow-up:  1. Follow outpatient CBC/CMP 2. Continue quarantine per CDC guidelines (Sept 14th) 3. Follow BP with outpatient PCP (HCTZ d/c'd as pt on lasix) 4. Follow outpatient CXR in 3-4 weeks 5. 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 She reports she has been feeling bad since August 13. She was tested for Covid at [facility] on 16 August and was told she was positive on 18 August. She has not been treated with any antiviral or monoclonal antibodies as an outpatient. She was seen by her pulmonologist yesterday and was prescribed an antibiotic. Not sure which one. Today she had some hard coughing with some mild blood-tinged sputum. Had no gross hemoptysis. She has had subjective fever and chills. Denies any nausea, vomiting or diarrhea. She does have generalized body aches. She does have [name] history of fibromyalgia but body aches seem more pronounced than her normal. She used Tylenol and Motrin at home but with her worsening shortness of breath  she came to the emergency room. Her brother and his wife have been living with her for the last week in the day they moved and they started coughing she thinks they were diagnosed with Covid [name] few days ago. She does not use oxygen at home. She does have [name] history of OSA and uses CPAP at night. History of smoking but quit in 2014. She denies any alcohol or illicit drug use.  She was admitted for COVID 19 pneumonia.  She's improved with steroids and remdesivir.  There was concern for PE with hemoptysis, but she's improved and has had negative w/u for VTE.   Discharged on 8/29 in stable condition.  Hospital Course:  Acute Hypoxic Respiratory Failure 2/2 COVID 19 Pneumonia Unvaccinated  CXR with patchy herterogenous bilateral airspace opacities in mid lower lung zone predominant distribution CT PE protocol, limited exam, no obvious large or central PE, multifocal pneumonia Repeat CT scan without evidence of PE, limited study, COVID pneumonia Currently on RA, maintains with ambulation  Continue remdesivir and solumedrol - completed course of remdesivir, will d/c steroids at discharge I/O, daily weights Elevated inflammatory markers, will trend OOB, prone as able, IS, flutter, PT  COVID-19 Labs  Recent Labs    10/03/19 0501 10/04/19 0122 10/05/19 0342  DDIMER 0.62* 0.61* 0.47  FERRITIN 351* 278 255  CRP 1.7* 1.0* 0.6    Lab Results  Component Value Date   SARSCOV2NAA POSITIVE ([name]) 09/30/2019   Staph Epidermidis in 1 of 2 Blood Cx: suspected contaminant, follow results.  NGTD in second set.  Hemopytsis  Elevated D dimer: no evidence of VTE on LE [place]  or CT PE protocol x2.  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  D/c HCTZ at discharge as on loop diuretic.  Resume arb.  Diastolic CHF, chronic ([name]) Continue home Lasix dose.   Prolonged QT interval QTc 516 QTc 8/25 -> 474,  continue to monitor Continue to monitor, caution with QT prolonging meds   AKI (acute kidney injury) ([name]) Improved, continue to monitor  Morbid obesity ([name]) Chronic morbid obesity. Patient to follow-up with PCP for dietary lifestyle interventions for weight loss. Patient may also benefit from referral to bariatric surgery for evaluation  OSA (obstructive sleep apnea) Continue homeas needed for OSA. RT to follow  T2DM: 2 diff A1c's -> one was 6.2 and the other 6.5.  Recommended pt follow with PCP after she's off steroids for [name] while.    Procedures: LE [place] Summary:  RIGHT:  - There is no evidence of deep vein thrombosis in the lower extremity.  However, portions of this examination were limited- see technologist  comments above.    - No cystic structure found in the popliteal fossa.    LEFT:  - There is no evidence of deep vein thrombosis in the lower extremity.  However, portions of this examination were limited- see technologist  comments above.   Consultations:  none  Discharge Exam: Vitals:   10/05/19 0750 10/05/19 1150  BP: (!) 146/83 (!) 150/93  Pulse: 73 80  Resp: 18 19  Temp: 97.9 F (36.6 C) 98.4 F (36.9 C)  SpO2: 99% 94%   Feeling better Ready to go home today No new complaints  General: No acute distress. obese Cardiovascular: Heart sounds show Rumaldo Difatta regular rate, and rhythm. Lungs: Clear to auscultation bilaterally  Abdomen: Soft, nontender, nondistended Neurological: Alert and oriented 3. Moves all extremities 4. Cranial nerves II through XII grossly intact. Skin: Warm and dry. No rashes or lesions. Extremities: No clubbing or cyanosis. No edema.   Discharge Instructions   Discharge Instructions    Call MD for:  difficulty breathing, headache or visual disturbances   Complete by: As directed    Call MD for:  extreme fatigue   Complete by: As directed    Call MD for:  hives   Complete by: As directed    Call MD for:  persistant  dizziness or light-headedness   Complete by: As directed    Call MD for:  persistant nausea and vomiting   Complete by: As directed    Call MD for:  redness, tenderness, or signs of infection (pain, swelling, redness, odor or green/yellow discharge around incision site)   Complete by: As directed    Call MD for:  severe uncontrolled pain   Complete by: As directed    Call MD for:  temperature >100.4   Complete by: As directed    Diet - low sodium heart healthy   Complete by: As directed    Discharge instructions   Complete by: As directed    You were seen for covid 19 pneumonia.  You've improved with steroids and remdesivir.    You will need to quarantine until 9/14.  If you've improved without the need for fever reducing medicines at this time, you can discontinue quarantine after this date.    You can get your vaccine after you've fully recovered after your quarantine period.  Please follow up with your PCP with any questions.  Return for new, recurrent, or worsening symptoms.  Please ask your PCP to request records from this hospitalization so they know what was done and what the next steps will be.   Increase activity slowly   Complete by: As directed      Allergies as of 10/05/2019      Reactions   Lisinopril Cough   Levaquin [levofloxacin]    Nauseated       Medication List    STOP taking these medications   azithromycin 250 MG tablet Commonly known as: ZITHROMAX   budesonide-formoterol 80-4.5 MCG/ACT inhaler Commonly known as: Symbicort   doxycycline 100 MG tablet Commonly known as: VIBRA-TABS   hydrochlorothiazide 25 MG tablet Commonly known as: HYDRODIURIL   ibuprofen 800 MG tablet Commonly known as: ADVIL   potassium chloride SA 20 MEQ tablet Commonly known as: KLOR-CON   predniSONE 10 MG tablet Commonly known as: DELTASONE     TAKE these medications   albuterol 108 (90 Base) MCG/ACT inhaler Commonly known as: VENTOLIN HFA Inhale 2 puffs into  the lungs every 6 (six) hours as needed. wheezing   albuterol (2.5 MG/3ML) 0.083% nebulizer solution Commonly known as: PROVENTIL Take 3 mLs (2.5 mg total) by nebulization every 6 (six) hours as needed for wheezing or shortness of breath.   ALPRAZolam 1 MG tablet Commonly known as: XANAX Take 1 mg by mouth 2 (two) times daily as needed for anxiety.   amLODipine 10 MG tablet Commonly known as: NORVASC TAKE 1 TABLET(10 MG) BY MOUTH TWICE DAILY What changed: See the new instructions.   cloNIDine 0.2 MG tablet Commonly known as: CATAPRES Take 1 tablet (0.2 mg total) by mouth 2 (two) times daily.   cyclobenzaprine 10 MG tablet Commonly known as: FLEXERIL Take 1 tablet (10 mg total) by mouth 2 (two) times daily as needed for muscle spasms.   furosemide 40 MG tablet Commonly known as: LASIX Take 80 mg by mouth 2 (two) times daily.   gabapentin 300 MG capsule Commonly known as: NEURONTIN Take 300 mg by mouth 2 (two) times daily. 800mg    gabapentin 800 MG tablet Commonly known as: NEURONTIN Take 800 mg by mouth 3 (three) times daily.   losartan 100 MG tablet Commonly known as: COZAAR Take 100 mg by mouth daily. What changed: Another medication with the same name was removed. Continue taking this medication, and follow the directions you see here.   Potassium Chloride ER 20 MEQ Tbcr Take 1 tablet by mouth daily.   traZODone 100 MG tablet Commonly known as: DESYREL TAKE 1 TABLET(100 MG) BY MOUTH AT BEDTIME AS NEEDED FOR SLEEP What changed: See the new instructions.            Durable Medical Equipment  (From admission, onward)         Start     Ordered   10/05/19 1609  DME 3-in-1  Once        10/05/19 1608   10/03/19 1426  For home use only DME 3 n 1  Once       Comments: Needs BARIATRIC 3:1   10/03/19 1426         Allergies  Allergen Reactions  . Lisinopril Cough  . Levaquin [Levofloxacin]     Nauseated       The results of significant diagnostics  from this hospitalization (including imaging, microbiology, ancillary and laboratory) are listed below for reference.    Significant Diagnostic Studies: CT ANGIO CHEST PE W OR WO CONTRAST  Result Date: 10/04/2019 CLINICAL DATA:  Evaluate for  pulmonary embolus.  Positive D-dimer. EXAM: CT ANGIOGRAPHY CHEST WITH CONTRAST TECHNIQUE: Multidetector CT imaging of the chest was performed using the standard protocol during bolus administration of intravenous contrast. Multiplanar CT image reconstructions and MIPs were obtained to evaluate the vascular anatomy. CONTRAST:  172mL OMNIPAQUE IOHEXOL 350 MG/ML SOLN COMPARISON:  09/30/2019 FINDINGS: Cardiovascular: The main pulmonary artery is patent. No central obstructing embolus. Exam is mildly limited due to respiratory motion artifact. There is also sub optimal pulmonary arterial opacification at the level of the segmental and subsegmental pulmonary arteries. No central obstructing pulmonary embolus. No lobar pulmonary artery filling defects identified. Heart size appears normal. Small pericardial effusion noted. Mediastinum/Nodes: Normal appearance of the thyroid gland. The trachea appears patent and is midline. Normal appearance of the esophagus. No enlarged axillary, supraclavicular, or mediastinal lymph nodes. Right hilar lymph node measures 1.3 cm, image 116/6. Lungs/Pleura: No pleural effusions. Again seen are bilateral, multifocal patchy areas of ground-glass and airspace densities within upper lung zone predilection. When compared with the previous exam there is been no significant interval progression. In fact, the opacities within the lower lobes appear improved in the interval. Subpleural bleb within the posteromedial aspect of the right lower lobe is unchanged measuring 2.8 cm. Upper Abdomen: No acute abnormality within the imaged portions of the upper abdomen. Musculoskeletal: Multilevel degenerative disc disease. Review of the MIP images confirms the above  findings. IMPRESSION: 1. Exam detail is diminished secondary to respiratory motion artifact and sub optimal pulmonary arterial opacification. Within this limitation there is no evidence for acute pulmonary embolus up to the level of the lobar pulmonary arteries. 2. Bilateral, multifocal patchy areas of ground-glass and airspace densities are again noted compatible with COVID pneumonia. When compared with the previous exam the opacities within the lower lobes appear improved in the interval. 3. Small pericardial effusion. Electronically Signed   By: Kerby Moors M.D.   On: 10/04/2019 07:03   CT ANGIO CHEST PE W OR WO CONTRAST  Result Date: 09/30/2019 CLINICAL DATA:  54 year old female with shortness of breath. Positive COVID-19. Concern for pulmonary embolism. EXAM: CT ANGIOGRAPHY CHEST WITH CONTRAST TECHNIQUE: Multidetector CT imaging of the chest was performed using the standard protocol during bolus administration of intravenous contrast. Multiplanar CT image reconstructions and MIPs were obtained to evaluate the vascular anatomy. CONTRAST:  75 cc Omnipaque 350 COMPARISON:  Chest CT dated 02/09/2016. FINDINGS: Evaluation is limited due to artifact caused by body habitus as well as respiratory motion artifact. Cardiovascular: There is no cardiomegaly. Small pericardial effusion measuring 1 cm in thickness anterior to the heart. The thoracic aorta is unremarkable. Evaluation of the pulmonary arteries is very limited due to severe respiratory motion artifact and suboptimal opacification and timing of the contrast. No obvious large or central pulmonary artery embolus identified. V/Q scan may provide better evaluation if there is high clinical concern for acute PE. Mediastinum/Nodes: Bilateral hilar adenopathy measuring 2 cm on the right. The esophagus and the thyroid gland are grossly unremarkable as visualized. No mediastinal fluid collection. Lungs/Pleura: Bilateral confluent airspace opacities with peripheral  and subpleural distribution consistent with multifocal pneumonia and in keeping with COVID-19. Clinical correlation and follow-up recommended. There is no pleural effusion or pneumothorax. The central airways are patent. Upper Abdomen: Fatty liver. Musculoskeletal: Degenerative changes of the spine. No acute osseous pathology. Review of the MIP images confirms the above findings. IMPRESSION: 1. Very limited exam. No no obvious large or central pulmonary artery embolus. V/Q scan may provide better evaluation if there  is high clinical concern for acute PE. 2. Multifocal pneumonia and in keeping with COVID-19. Clinical correlation and follow-up recommended. 3. Bilateral hilar adenopathy, likely reactive. 4. Fatty liver. Electronically Signed   By: Anner Crete M.D.   On: 09/30/2019 23:02   DG Chest Portable 1 View  Result Date: 10/01/2019 CLINICAL DATA:  History of COVID-19 positivity EXAM: PORTABLE CHEST 1 VIEW COMPARISON:  09/30/2019 FINDINGS: Cardiac shadow is within normal limits. The lungs are well aerated bilaterally. Patchy airspace opacities are noted right greater than left consistent with the given clinical history of COVID-19 positivity. IMPRESSION: Changes consistent with bilateral COVID pneumonia. Electronically Signed   By: Inez Catalina M.D.   On: 10/01/2019 21:37   DG Chest Port 1 View  Result Date: 09/30/2019 CLINICAL DATA:  Shortness of breath.  COVID positive. EXAM: PORTABLE CHEST 1 VIEW COMPARISON:  Radiograph 10/10/2017 FINDINGS: Low lung volumes. Patchy heterogeneous bilateral airspace opacities in Beryle Bagsby mid-lower lung zone predominant distribution. Borderline mild cardiomegaly. No pneumothorax or large pleural effusion. Technically limited assessment of the lung bases due to portable technique and soft tissue attenuation from habitus. IMPRESSION: Patchy heterogeneous bilateral airspace opacities in Kelen Laura mid-lower lung zone predominant distribution, most consistent with COVID-19 pneumonia.  Electronically Signed   By: Keith Rake M.D.   On: 09/30/2019 17:21   VAS [place] LOWER EXTREMITY VENOUS (DVT)  Result Date: 10/02/2019  Lower Venous DVTStudy Indications: Pain, and Swelling.  Limitations: Body habitus and poor ultrasound/tissue interface. Comparison Study: 10/12/2016 rt leg Performing Technologist: Griffin Basil RCT RDMS  Examination Guidelines: Quintavious Rinck complete evaluation includes B-mode imaging, spectral Doppler, color Doppler, and power Doppler as needed of all accessible portions of each vessel. Bilateral testing is considered an integral part of Effie Janoski complete examination. Limited examinations for reoccurring indications may be performed as noted. The reflux portion of the exam is performed with the patient in reverse Trendelenburg.  +---------+---------------+---------+-----------+----------+------------------+ RIGHT    CompressibilityPhasicitySpontaneityPropertiesThrombus Aging     +---------+---------------+---------+-----------+----------+------------------+ CFV      Full           Yes      Yes                                     +---------+---------------+---------+-----------+----------+------------------+ SFJ      Full                                                            +---------+---------------+---------+-----------+----------+------------------+ FV Prox  Full                                                            +---------+---------------+---------+-----------+----------+------------------+ FV Mid   Full                                                            +---------+---------------+---------+-----------+----------+------------------+ FV DistalFull                                                            +---------+---------------+---------+-----------+----------+------------------+  PFV      Full                                                             +---------+---------------+---------+-----------+----------+------------------+ POP      Full           Yes      Yes                                     +---------+---------------+---------+-----------+----------+------------------+ PTV      Full                                                            +---------+---------------+---------+-----------+----------+------------------+ PERO                                                  partial visualized +---------+---------------+---------+-----------+----------+------------------+   +---------+---------------+---------+-----------+----------+------------------+ LEFT     CompressibilityPhasicitySpontaneityPropertiesThrombus Aging     +---------+---------------+---------+-----------+----------+------------------+ CFV      Full           Yes      Yes                                     +---------+---------------+---------+-----------+----------+------------------+ SFJ      Full                                                            +---------+---------------+---------+-----------+----------+------------------+ FV Prox  Full                                                            +---------+---------------+---------+-----------+----------+------------------+ FV Mid   Full                                                            +---------+---------------+---------+-----------+----------+------------------+ FV DistalFull                                                            +---------+---------------+---------+-----------+----------+------------------+ PFV      Full                                                            +---------+---------------+---------+-----------+----------+------------------+  POP      Full           Yes      Yes                                     +---------+---------------+---------+-----------+----------+------------------+ PTV      Full                                                             +---------+---------------+---------+-----------+----------+------------------+ PERO                                                  partial visualized +---------+---------------+---------+-----------+----------+------------------+     Summary: RIGHT: - There is no evidence of deep vein thrombosis in the lower extremity. However, portions of this examination were limited- see technologist comments above.  - No cystic structure found in the popliteal fossa.  LEFT: - There is no evidence of deep vein thrombosis in the lower extremity. However, portions of this examination were limited- see technologist comments above.  - No cystic structure found in the popliteal fossa.  *See table(s) above for measurements and observations. Electronically signed by Monica Martinez MD on 10/02/2019 at 3:11:51 PM.    Final     Microbiology: Recent Results (from the past 240 hour(s))  SARS Coronavirus 2 by RT PCR (hospital order, performed in [facility] hospital lab) Nasopharyngeal Nasopharyngeal Swab     Status: Abnormal   Collection Time: 09/30/19  5:45 PM   Specimen: Nasopharyngeal Swab  Result Value Ref Range Status   SARS Coronavirus 2 POSITIVE (Neli Fofana) NEGATIVE Final    Comment: RESULT CALLED TO, READ BACK BY AND VERIFIED WITH: D,SIDBURY @1935  09/30/19 EB (NOTE) SARS-CoV-2 target nucleic acids are DETECTED  SARS-CoV-2 RNA is generally detectable in upper respiratory specimens  during the acute phase of infection.  Positive results are indicative  of the presence of the identified virus, but do not rule out bacterial infection or co-infection with other pathogens not detected by the test.  Clinical correlation with patient history and  other diagnostic information is necessary to determine patient infection status.  The expected result is negative.  Fact Sheet for Patients:   StrictlyIdeas.no   Fact Sheet for Healthcare  Providers:   BankingDealers.co.za    This test is not yet approved or cleared by the [place] FDA and  has been authorized for detection and/or diagnosis of SARS-CoV-2 by FDA under an Emergency Use Authorization (EUA).  This EUA will remain in effect (meaning this test can b e used) for the duration of  the COVID-19 declaration under Section 564(b)(1) of the Act, 21 U.S.C. section 360-bbb-3(b)(1), unless the authorization is terminated or revoked sooner.  Performed at [facility], [place] [address] [place], [place] [address]   Blood Culture (routine x 2)     Status: Abnormal   Collection Time: 09/30/19  5:58 PM   Specimen: BLOOD  Result Value Ref Range Status   Specimen Description BLOOD SITE NOT SPECIFIED  Final   Special Requests   Final    BOTTLES  DRAWN AEROBIC AND ANAEROBIC Blood Culture adequate volume   Culture  Setup Time   Final    GRAM POSITIVE COCCI IN CLUSTERS AEROBIC BOTTLE ONLY Organism ID to follow CRITICAL RESULT CALLED TO, READ BACK BY AND VERIFIED WITH: J LEDFORD PHARMD 10/01/19 2319 JDW    Culture (Verna Hamon)  Final    STAPHYLOCOCCUS EPIDERMIDIS THE SIGNIFICANCE OF ISOLATING THIS ORGANISM FROM [employer] SINGLE SET OF BLOOD CULTURES WHEN MULTIPLE SETS ARE DRAWN IS UNCERTAIN. PLEASE NOTIFY THE MICROBIOLOGY DEPARTMENT WITHIN ONE WEEK IF SPECIATION AND SENSITIVITIES ARE REQUIRED. Performed at [facility], [place] [address] [place], [place] [address]    Report Status 10/03/2019 FINAL  Final  Blood Culture ID Panel (Reflexed)     Status: Abnormal   Collection Time: 09/30/19  5:58 PM  Result Value Ref Range Status   Enterococcus faecalis NOT DETECTED NOT DETECTED Final   Enterococcus Faecium NOT DETECTED NOT DETECTED Final   Listeria monocytogenes NOT DETECTED NOT DETECTED Final   Staphylococcus species DETECTED (Kathrin Folden) NOT DETECTED Final    Comment: CRITICAL RESULT CALLED TO, READ BACK BY AND VERIFIED WITH: J LEDFORD PHARMD 10/01/19 2319 JDW     Staphylococcus aureus (BCID) NOT DETECTED NOT DETECTED Final   Staphylococcus epidermidis DETECTED (Rashawnda Gaba) NOT DETECTED Final    Comment: Methicillin (oxacillin) resistant coagulase negative staphylococcus. Possible blood culture contaminant (unless isolated from more than one blood culture draw or clinical case suggests pathogenicity). No antibiotic treatment is indicated for blood  culture contaminants. CRITICAL RESULT CALLED TO, READ BACK BY AND VERIFIED WITH: J LEDFORD PHARMD 10/01/19 2319 JDW    Staphylococcus lugdunensis NOT DETECTED NOT DETECTED Final   Streptococcus species NOT DETECTED NOT DETECTED Final   Streptococcus agalactiae NOT DETECTED NOT DETECTED Final   Streptococcus pneumoniae NOT DETECTED NOT DETECTED Final   Streptococcus pyogenes NOT DETECTED NOT DETECTED Final   Adilee Lemme.calcoaceticus-baumannii NOT DETECTED NOT DETECTED Final   Bacteroides fragilis NOT DETECTED NOT DETECTED Final   Enterobacterales NOT DETECTED NOT DETECTED Final   Enterobacter cloacae complex NOT DETECTED NOT DETECTED Final   Escherichia coli NOT DETECTED NOT DETECTED Final   Klebsiella aerogenes NOT DETECTED NOT DETECTED Final   Klebsiella oxytoca NOT DETECTED NOT DETECTED Final   Klebsiella pneumoniae NOT DETECTED NOT DETECTED Final   Proteus species NOT DETECTED NOT DETECTED Final   Salmonella species NOT DETECTED NOT DETECTED Final   Serratia marcescens NOT DETECTED NOT DETECTED Final   Haemophilus influenzae NOT DETECTED NOT DETECTED Final   Neisseria meningitidis NOT DETECTED NOT DETECTED Final   Pseudomonas aeruginosa NOT DETECTED NOT DETECTED Final   Stenotrophomonas maltophilia NOT DETECTED NOT DETECTED Final   Candida albicans NOT DETECTED NOT DETECTED Final   Candida auris NOT DETECTED NOT DETECTED Final   Candida glabrata NOT DETECTED NOT DETECTED Final   Candida krusei NOT DETECTED NOT DETECTED Final   Candida parapsilosis NOT DETECTED NOT DETECTED Final   Candida tropicalis NOT DETECTED NOT  DETECTED Final   Cryptococcus neoformans/gattii NOT DETECTED NOT DETECTED Final   Methicillin resistance mecA/C DETECTED (Mendel Bins[place]) NOT DETECTED Final    Comment: CRITICAL RESULT CALLED TO, READ BACK BY AND VERIFIED WITHKarsten Ro [facility] 10/01/19 2319 JDW Performed at [facility], [address], [place], [place] [address]   Blood Culture (routine x 2)     Status: None (Preliminary result)   Collection Time: 09/30/19  6:01 PM   Specimen: BLOOD  Result Value Ref Range Status   Specimen Description BLOOD SITE NOT  SPECIFIED  Final   Special Requests   Final    BOTTLES DRAWN AEROBIC ONLY Blood Culture adequate volume   Culture   Final    NO GROWTH 4 DAYS Performed at [facility], [address] [place],  [address]    Report Status PENDING  Incomplete     Labs: Basic Metabolic Panel: Recent Labs  Lab 10/01/19 0457 10/02/19 0313 10/03/19 0501 10/04/19 0122 10/05/19 0342  NA 135 137 141 138 140  K 3.6 3.1* 4.2 3.9 4.1  CL 98 98 103 101 100  CO2 23 27 27 27 29   GLUCOSE 142* 191* 169* 199* 181*  BUN 17 23* 28* 27* 24*  CREATININE 1.19* 1.22* 1.10* 0.99 0.92  CALCIUM 7.8* 8.0* 8.1* 7.9* 7.8*  MG  --   --  2.3 2.1 2.2  PHOS  --   --  4.0 3.8 4.9*   Liver Function Tests: Recent Labs  Lab 10/01/19 0457 10/02/19 0313 10/03/19 0501 10/04/19 0122 10/05/19 0342  AST 24 24 22 29  44*  ALT 26 26 28  39 66*  ALKPHOS 54 54 51 51 45  BILITOT 0.7 0.5 0.6 0.7 0.9  PROT 6.4* 6.1* 6.0* 5.9* 5.8*  ALBUMIN 2.7* 2.5* 2.6* 2.6* 2.7*   No results for input(s): LIPASE, AMYLASE in the last 168 hours. No results for input(s): AMMONIA in the last 168 hours. CBC: Recent Labs  Lab 10/01/19 0457 10/02/19 0313 10/03/19 0501 10/04/19 0122 10/05/19 0342  WBC 5.2 7.9 9.4 9.9 8.9  NEUTROABS 4.4 5.9 7.5 7.4 6.8  HGB 14.2 13.3 13.6 13.6 13.4  HCT 46.6* 43.7 44.4 44.6 44.2  MCV 91.6 90.1 89.9 89.4 88.2  PLT 205 232 279 299 295   Cardiac Enzymes: No results for input(s):  CKTOTAL, CKMB, CKMBINDEX, TROPONINI in the last 168 hours. BNP: BNP (last 3 results) No results for input(s): BNP in the last 8760 hours.  ProBNP (last 3 results) No results for input(s): PROBNP in the last 8760 hours.  CBG: Recent Labs  Lab 10/04/19 1206 10/04/19 1631 10/04/19 2127 10/05/19 0748 10/05/19 1146  GLUCAP 195* 217* 161* 217* 207*       Signed:  Fayrene Helper MD.  [facility] 10/05/2019, 4:11 PM

## 2019-10-05 NOTE — Progress Notes (Signed)
Pt given discharge instructions, prescriptions, and care notes. Pt verbalized understanding AEB no further questions or concerns at this time. IV was discontinued, no redness, pain, or swelling noted at this time. Telemetry discontinued and Centralized Telemetry was notified. Pt left the floor via wheelchair with staff in stable condition. 

## 2020-03-17 ENCOUNTER — Telehealth: Payer: Self-pay | Admitting: Internal Medicine

## 2020-03-17 NOTE — Telephone Encounter (Signed)
Called and spoke with pt and she stated that since she had covid she has to get an abx at times for this congestion.  She is having sinus and chest congestion that is yellow/brown x 1 week.  She denies any other symptoms of fever, chills, body aches or wheezing.  She stated that the only thing that helps her is the abx.  MR please advise.  Thanks

## 2020-03-18 MED ORDER — DOXYCYCLINE HYCLATE 100 MG PO TABS: 100.0000 mg | ORAL_TABLET | Freq: Two times a day (BID) | ORAL | 0 refills | Status: DC

## 2020-03-18 MED ORDER — PREDNISONE 10 MG PO TABS: ORAL_TABLET | ORAL | 0 refills | Status: AC

## 2020-03-18 NOTE — Telephone Encounter (Signed)
  You have mild attack of copd called COPD exacerbation  Take doxycycline 100mg  po twice daily x 5 days; take after meals and avoid sunlight  Please take prednisone 40 mg x1 day, then 30 mg x1 day, then 20 mg x1 day, then 10 mg x1 day, and then 5 mg x1 day and stop  Go to ER if worse  Allergies  Allergen Reactions  . Lisinopril Cough  . Levaquin [Levofloxacin]     Nauseated

## 2020-03-18 NOTE — Telephone Encounter (Signed)
Called spoke with patient.  Let her know Dr. Golden Pop recommendations Verified pharmacy Placed order Patient voiced understanding Nothing further needed at this time.

## 2021-01-13 ENCOUNTER — Telehealth: Payer: Self-pay | Admitting: Pulmonary Disease

## 2021-01-13 MED ORDER — PREDNISONE 20 MG PO TABS: 40.0000 mg | ORAL_TABLET | Freq: Every day | ORAL | 0 refills | Status: DC

## 2021-01-13 MED ORDER — AZITHROMYCIN 250 MG PO TABS: ORAL_TABLET | ORAL | 0 refills | Status: DC

## 2021-01-13 NOTE — Telephone Encounter (Signed)
Please call in prednsione 40mg /day for 5 days and z pack We will reassess at time of return clinic visit

## 2021-01-13 NOTE — Telephone Encounter (Signed)
Called and spoke with pt who states that she has had symptoms for the past 3-4 days including cough with yellow to brown phlegm and has also been wheezing.  Pt denied any complaints of fever, has not checked temp recently but does not feel like she has a temp. Pt has been taking mucinex as well as alka seltzer plus to see if that would help with her symptoms.  Pt has used her rescue inhaler at least 3-4 times a day; states that she has not used her nebulizer recently.  Pt has not been seen at the office since 09/18/2018 but does have an upcoming appt scheduled with Dr. Vaughan Browner 12/16. Since pt does have the upcoming appt scheduled, she wants to know if there is something we could recommend prior to her appt to help with her symptoms. Dr. Vaughan Browner, please advise.

## 2021-01-13 NOTE — Telephone Encounter (Signed)
Called and spoke with pt letting her know recs per Dr. Vaughan Browner and she verbalized understanding. Rx for zpak and pred have been sent to pharmacy for pt.nothing further needed.

## 2021-01-15 ENCOUNTER — Encounter (HOSPITAL_COMMUNITY): Payer: Self-pay | Admitting: Emergency Medicine

## 2021-01-15 ENCOUNTER — Emergency Department (HOSPITAL_COMMUNITY): Payer: Medicaid Other

## 2021-01-15 ENCOUNTER — Other Ambulatory Visit: Payer: Self-pay

## 2021-01-15 ENCOUNTER — Emergency Department (HOSPITAL_COMMUNITY)
Admission: EM | Admit: 2021-01-15 | Discharge: 2021-01-15 | Disposition: A | Discharge status: Home / Self Care | Payer: Medicaid Other | Attending: Emergency Medicine | Admitting: Emergency Medicine

## 2021-01-15 DIAGNOSIS — E1122 Type 2 diabetes mellitus with diabetic chronic kidney disease: Secondary | ICD-10-CM | POA: Insufficient documentation

## 2021-01-15 DIAGNOSIS — Z7951 Long term (current) use of inhaled steroids: Secondary | ICD-10-CM | POA: Diagnosis not present

## 2021-01-15 DIAGNOSIS — R519 Headache, unspecified: Secondary | ICD-10-CM | POA: Insufficient documentation

## 2021-01-15 DIAGNOSIS — Z79899 Other long term (current) drug therapy: Secondary | ICD-10-CM | POA: Diagnosis not present

## 2021-01-15 DIAGNOSIS — N189 Chronic kidney disease, unspecified: Secondary | ICD-10-CM | POA: Insufficient documentation

## 2021-01-15 DIAGNOSIS — R0789 Other chest pain: Secondary | ICD-10-CM

## 2021-01-15 DIAGNOSIS — I5032 Chronic diastolic (congestive) heart failure: Secondary | ICD-10-CM | POA: Insufficient documentation

## 2021-01-15 DIAGNOSIS — I130 Hypertensive heart and chronic kidney disease with heart failure and stage 1 through stage 4 chronic kidney disease, or unspecified chronic kidney disease: Secondary | ICD-10-CM | POA: Diagnosis not present

## 2021-01-15 DIAGNOSIS — J449 Chronic obstructive pulmonary disease, unspecified: Secondary | ICD-10-CM | POA: Diagnosis not present

## 2021-01-15 DIAGNOSIS — Z87891 Personal history of nicotine dependence: Secondary | ICD-10-CM | POA: Diagnosis not present

## 2021-01-15 DIAGNOSIS — R072 Precordial pain: Secondary | ICD-10-CM | POA: Insufficient documentation

## 2021-01-15 DIAGNOSIS — Z8616 Personal history of COVID-19: Secondary | ICD-10-CM | POA: Insufficient documentation

## 2021-01-15 DIAGNOSIS — J45909 Unspecified asthma, uncomplicated: Secondary | ICD-10-CM | POA: Diagnosis not present

## 2021-01-15 LAB — CBC WITH DIFFERENTIAL/PLATELET
Abs Immature Granulocytes: 0.01 10*3/uL (ref 0.00–0.07)
Basophils Absolute: 0 10*3/uL (ref 0.0–0.1)
Basophils Relative: 0 %
Eosinophils Absolute: 0.2 10*3/uL (ref 0.0–0.5)
Eosinophils Relative: 2 %
HCT: 46.8 % — ABNORMAL HIGH (ref 36.0–46.0)
Hemoglobin: 14.6 g/dL (ref 12.0–15.0)
Immature Granulocytes: 0 %
Lymphocytes Relative: 27 %
Lymphs Abs: 2 10*3/uL (ref 0.7–4.0)
MCH: 28.3 pg (ref 26.0–34.0)
MCHC: 31.2 g/dL (ref 30.0–36.0)
MCV: 90.7 fL (ref 80.0–100.0)
Monocytes Absolute: 0.5 10*3/uL (ref 0.1–1.0)
Monocytes Relative: 6 %
Neutro Abs: 5 10*3/uL (ref 1.7–7.7)
Neutrophils Relative %: 65 %
Platelets: 250 10*3/uL (ref 150–400)
RBC: 5.16 MIL/uL — ABNORMAL HIGH (ref 3.87–5.11)
RDW: 13.4 % (ref 11.5–15.5)
WBC: 7.7 10*3/uL (ref 4.0–10.5)
nRBC: 0 % (ref 0.0–0.2)

## 2021-01-15 LAB — COMPREHENSIVE METABOLIC PANEL
ALT: 12 U/L (ref 0–44)
AST: 13 U/L — ABNORMAL LOW (ref 15–41)
Albumin: 3.9 g/dL (ref 3.5–5.0)
Alkaline Phosphatase: 70 U/L (ref 38–126)
Anion gap: 8 (ref 5–15)
BUN: 10 mg/dL (ref 6–20)
CO2: 26 mmol/L (ref 22–32)
Calcium: 8.8 mg/dL — ABNORMAL LOW (ref 8.9–10.3)
Chloride: 105 mmol/L (ref 98–111)
Creatinine, Ser: 0.77 mg/dL (ref 0.44–1.00)
GFR, Estimated: >60 mL/min (ref >60)
Glucose, Bld: 95 mg/dL (ref 70–99)
Potassium: 3.8 mmol/L (ref 3.5–5.1)
Sodium: 139 mmol/L (ref 135–145)
Total Bilirubin: 1 mg/dL (ref 0.3–1.2)
Total Protein: 7.3 g/dL (ref 6.5–8.1)

## 2021-01-15 LAB — TROPONIN I (HIGH SENSITIVITY)
Troponin I (High Sensitivity): 9 ng/L (ref <18)
Troponin I (High Sensitivity): 10 ng/L (ref <18)

## 2021-01-15 MED ORDER — KETOROLAC TROMETHAMINE 15 MG/ML IJ SOLN
15.0000 mg | Freq: Once | INTRAMUSCULAR | Status: AC
  Administered 2021-01-15: 15 mg via INTRAMUSCULAR
  Filled 2021-01-15: qty 1

## 2021-01-15 NOTE — ED Notes (Signed)
ED Provider at bedside. 

## 2021-01-15 NOTE — ED Notes (Signed)
Pt NAD, a/ox4. Pt verbalizes understanding of all DC and f/u instructions. All questions answered. Pt assisted into w/c and taken to parking lot by daughter.

## 2021-01-15 NOTE — Discharge Instructions (Signed)
Return for any problem.  ?

## 2021-01-15 NOTE — ED Notes (Signed)
Family at bedside to take pt home.

## 2021-01-15 NOTE — ED Notes (Signed)
Applied Purewik

## 2021-01-15 NOTE — ED Triage Notes (Addendum)
Per EMS, patient from home, c/o intermittent chest pain described as pressure x3 days.   325mg  ASA with EMS

## 2021-01-15 NOTE — ED Provider Notes (Signed)
Emergency Medicine Provider Triage Evaluation Note  Jenna Chandler , a 55 y.o. female  was evaluated in triage.  Pt complains of chest pain.  Pt reports pain seems similar to heart attack pain.  Pt also reports increased gas  Review of Systems  Positive: Chest pain belching, passing increased gas Negative: No fever  Physical Exam  BP (!) 160/82 (BP Location: Left Arm)   Pulse 73   Temp 98.2 F (36.8 C) (Oral)   Resp 16   Ht 5\' 4"  (1.626 m)   Wt (!) 226.8 kg   SpO2 96%   BMI 85.82 kg/m  Gen:   Awake, no distress   Resp:  Normal effort  MSK:   Moves extremities without difficulty  Other:    Medical Decision Making  Medically screening exam initiated at 12:34 PM.  Appropriate orders placed.  Jenna Chandler was informed that the remainder of the evaluation will be completed by another provider, this initial triage assessment does not replace that evaluation, and the importance of remaining in the ED until their evaluation is complete.     Fransico Meadow, [place] 01/15/21 1236    Wyvonnia Dusky, MD 01/15/21 [phone]

## 2021-01-15 NOTE — ED Notes (Signed)
Pt NAD in bed, a/ox4. Pt states she has had right parietal head tingling with right arm heaviness, chest tightness x 2-3 days. Pt states it got worse today so she called 911. Pt denies SOB, n/v, dizziness or weakness.

## 2021-01-15 NOTE — ED Notes (Signed)
Pt moved up in bed

## 2021-01-15 NOTE — ED Provider Notes (Signed)
[facility] DEPT Provider Note   CSN: [id number] Arrival date & time: 01/15/21  1203     History Chief Complaint  Patient presents with   Chest Pain    Jenna Chandler is a 55 y.o. female.  55 year old female with prior medical history as detailed below presents for evaluation.  Patient complains of 3 days of constant chest discomfort.  This was associated with 3 days of constant headache.  Patient describes her chest discomfort as being "like being punched on the inside with a fist."  She denies shortness of breath.  She denies fever.  She describes a global headache.  She not take anything for her symptoms at home- except for aspirin.  She denies other complaint.  The history is provided by the patient.  Chest Pain Pain location:  Substernal area Pain quality: aching   Pain radiates to:  Does not radiate Pain severity:  Mild Onset quality:  Gradual Duration:  3 days Timing:  Constant Progression:  Worsening Chronicity:  New     Past Medical History:  Diagnosis Date   Anxiety    Asthma    Chronic diastolic CHF (congestive heart failure) ([name])    Chronic kidney disease    COPD (chronic obstructive pulmonary disease) ([name])    +/- asthma    Depression    Diabetes mellitus without complication ([place])    Dyspnea    Enlarged heart    Fibromyalgia    Hypertension    Manic, depressive ([place])    Morbid obesity ([name])    OSA (obstructive sleep apnea)     Patient Active Problem List   Diagnosis Date Noted   Pneumonia due to COVID-19 virus 09/30/2019   Hypoxia 09/30/2019   Prolonged QT interval 09/30/2019   AKI (acute kidney injury) ([place]) 09/30/2019   Restrictive lung disease 01/27/2019   Fibromyalgia 04/30/2018   SOB (shortness of breath) 05/05/2014   Chest pain 05/05/2014   Chronic respiratory failure ([place]) 04/26/2012   OSA (obstructive sleep apnea) 06/02/2011   Lethargy 50/10/3816   Diastolic CHF, chronic ([place]) 06/01/2011   [name]  Copd with small airways disease 05/11/2011   HTN (hypertension) 05/11/2011   Morbid obesity ([place]) 05/11/2011   Normocytic anemia 05/11/2011   Manic, depressive ([name])     Past Surgical History:  Procedure Laterality Date   NO PAST SURGERIES       OB History     Gravida  1   Para  1   Term  1   Preterm      AB      Living  1      SAB      IAB      Ectopic      Multiple      Live Births  1           Family History  Problem Relation Age of Onset   Diabetes Father    Cancer Mother    Diabetes Mother     Social History   Tobacco Use   Smoking status: Former    Packs/day: 2.00    Years: 15.00    Pack years: 30.00    Types: Cigarettes    Quit date: 05/24/2002    Years since quitting: 18.6   Smokeless tobacco: Never  Vaping Use   Vaping Use: Never used  Substance Use Topics   Alcohol use: No   Drug use: No    Home Medications Prior to Admission medications  Medication Sig Start Date End Date Taking? Authorizing Provider  albuterol (PROVENTIL HFA;VENTOLIN HFA) 108 (90 BASE) MCG/ACT inhaler Inhale 2 puffs into the lungs every 6 (six) hours as needed. wheezing 12/29/13   Elsie Stain, MD  albuterol (PROVENTIL) (2.5 MG/3ML) 0.083% nebulizer solution Take 3 mLs (2.5 mg total) by nebulization every 6 (six) hours as needed for wheezing or shortness of breath. 01/27/19   Lauraine Rinne, NP  ALPRAZolam Duanne Moron) 1 MG tablet Take 1 mg by mouth 2 (two) times daily as needed for anxiety.  08/29/18   [provider]  amLODipine (NORVASC) 10 MG tablet TAKE 1 TABLET(10 MG) BY MOUTH TWICE DAILY Patient taking differently: Take 10 mg by mouth daily.  12/26/17   Sueanne Margarita, MD  azithromycin (ZITHROMAX) 250 MG tablet Take two today and then one daily until finished. 01/13/21   Mannam, Hart Robinsons, MD  cloNIDine (CATAPRES) 0.2 MG tablet Take 1 tablet (0.2 mg total) by mouth 2 (two) times daily. 09/10/17   Sueanne Margarita, MD  cyclobenzaprine (FLEXERIL) 10 MG  tablet Take 1 tablet (10 mg total) by mouth 2 (two) times daily as needed for muscle spasms. 08/26/13   Kirichenko, Lahoma Rocker, PA-C  doxycycline (VIBRA-TABS) 100 MG tablet Take 1 tablet (100 mg total) by mouth 2 (two) times daily. 03/18/20   Brand Males, MD  furosemide (LASIX) 40 MG tablet Take 80 mg by mouth 2 (two) times daily. 05/18/11   Black, Lezlie Octave, NP  gabapentin (NEURONTIN) 300 MG capsule Take 300 mg by mouth 2 (two) times daily. 800mg  09/12/16   [provider]  gabapentin (NEURONTIN) 800 MG tablet Take 800 mg by mouth 3 (three) times daily. 06/26/19   [provider]  losartan (COZAAR) 100 MG tablet Take 100 mg by mouth daily. 09/26/19   [provider]  Potassium Chloride ER 20 MEQ TBCR Take 1 tablet by mouth daily. 09/29/19   [provider]  predniSONE (DELTASONE) 20 MG tablet Take 2 tablets (40 mg total) by mouth daily with breakfast. 01/13/21   Mannam, Praveen, MD  traZODone (DESYREL) 100 MG tablet TAKE 1 TABLET(100 MG) BY MOUTH AT BEDTIME AS NEEDED FOR SLEEP Patient taking differently: Take 100 mg by mouth at bedtime as needed for sleep.  11/04/18   Martyn Ehrich, NP    Allergies    Lisinopril and Levaquin [levofloxacin]  Review of Systems   Review of Systems  Cardiovascular:  Positive for chest pain.  All other systems reviewed and are negative.  Physical Exam Updated Vital Signs BP (!) 154/81 (BP Location: Right Arm)   Pulse 69   Temp 98.2 F (36.8 C) (Oral)   Resp 19   Ht 5\' 4"  (1.626 m)   Wt (!) 226.8 kg   SpO2 97%   BMI 85.82 kg/m   Physical Exam Vitals and nursing note reviewed.  Constitutional:      General: She is not in acute distress.    Appearance: Normal appearance. She is well-developed.  HENT:     Head: Normocephalic and atraumatic.  Eyes:     Conjunctiva/sclera: Conjunctivae normal.     Pupils: Pupils are equal, round, and reactive to light.  Cardiovascular:     Rate and Rhythm: Normal rate and regular  rhythm.     Heart sounds: Normal heart sounds.  Pulmonary:     Effort: Pulmonary effort is normal. No respiratory distress.     Breath sounds: Normal breath sounds.  Abdominal:     General:  There is no distension.     Palpations: Abdomen is soft.     Tenderness: There is no abdominal tenderness.  Musculoskeletal:        General: No deformity. Normal range of motion.     Cervical back: Normal range of motion and neck supple.  Skin:    General: Skin is warm and dry.  Neurological:     General: No focal deficit present.     Mental Status: She is alert and oriented to person, place, and time.    ED Results / Procedures / Treatments   Labs (all labs ordered are listed, but only abnormal results are displayed) Labs Reviewed  CBC WITH DIFFERENTIAL/PLATELET - Abnormal; Notable for the following components:      Result Value   RBC 5.16 (*)    HCT 46.8 (*)    All other components within normal limits  COMPREHENSIVE METABOLIC PANEL - Abnormal; Notable for the following components:   Calcium 8.8 (*)    AST 13 (*)    All other components within normal limits  TROPONIN I (HIGH SENSITIVITY)  TROPONIN I (HIGH SENSITIVITY)    EKG None  Radiology DG Chest Portable 1 View  Result Date: 01/15/2021 CLINICAL DATA:  Chest pain EXAM: PORTABLE CHEST 1 VIEW COMPARISON:  Chest x-ray 10/01/2019 FINDINGS: Limited study due to body habitus and portable technique. Heart size appears normal given technique. No focal consolidation identified. No pleural effusion or pneumothorax identified. IMPRESSION: No acute process identified. Electronically Signed   By: Ofilia Neas M.D.   On: 01/15/2021 13:43    Procedures Procedures   Medications Ordered in ED Medications - No data to display  ED Course  I have reviewed the triage vital signs and the nursing notes.  Pertinent labs & imaging results that were available during my care of the patient were reviewed by me and considered in my medical  decision making (see chart for details).    MDM Rules/Calculators/A&P                           MDM  MSE complete  Jenna Chandler was evaluated in Emergency Department on 01/15/2021 for the symptoms described in the history of present illness. She was evaluated in the context of the global COVID-19 pandemic, which necessitated consideration that the patient might be at risk for infection with the SARS-CoV-2 virus that causes COVID-19. Institutional protocols and algorithms that pertain to the evaluation of patients at risk for COVID-19 are in a state of rapid change based on information released by regulatory bodies including the CDC and federal and state organizations. These policies and algorithms were followed during the patient's care in the ED.  Patient presented with complaint of 3 days of constant chest discomfort. She also complained of headache and fatigue and body aches.  Described chest pain is not suggestive of ACS. EKG is without ischemia. Troponin x2 is 9 and 10.  CT imaging is without acute findings.   CXR is without acute findings.   Labs otherwise obtained are without significant abnormality.  Patient is reassured by workup. She desires DC home.   Importance of close FU is advised. She reportedly has an appointment on Monday with her PCP.  Strict return precautions given and understood.   Final Clinical Impression(s) / ED Diagnoses Final diagnoses:  Atypical chest pain  Nonintractable headache, unspecified chronicity pattern, unspecified headache type    Rx / DC Orders ED Discharge Orders  None        Valarie Merino, MD 01/15/21 2151

## 2021-01-21 ENCOUNTER — Ambulatory Visit: Payer: Medicaid Other | Admitting: Pulmonary Disease

## 2021-02-22 ENCOUNTER — Other Ambulatory Visit: Payer: Self-pay | Admitting: Internal Medicine

## 2021-02-25 ENCOUNTER — Emergency Department (HOSPITAL_COMMUNITY)
Admission: EM | Admit: 2021-02-25 | Discharge: 2021-02-25 | Disposition: A | Discharge status: Home / Self Care | Payer: Medicaid Other | Attending: Emergency Medicine | Admitting: Emergency Medicine

## 2021-02-25 ENCOUNTER — Other Ambulatory Visit: Payer: Self-pay

## 2021-02-25 ENCOUNTER — Encounter (HOSPITAL_COMMUNITY): Payer: Self-pay

## 2021-02-25 ENCOUNTER — Emergency Department (HOSPITAL_COMMUNITY): Payer: Medicaid Other

## 2021-02-25 DIAGNOSIS — R0602 Shortness of breath: Secondary | ICD-10-CM | POA: Insufficient documentation

## 2021-02-25 DIAGNOSIS — R062 Wheezing: Secondary | ICD-10-CM | POA: Insufficient documentation

## 2021-02-25 DIAGNOSIS — Z20822 Contact with and (suspected) exposure to covid-19: Secondary | ICD-10-CM | POA: Diagnosis not present

## 2021-02-25 DIAGNOSIS — J441 Chronic obstructive pulmonary disease with (acute) exacerbation: Secondary | ICD-10-CM

## 2021-02-25 DIAGNOSIS — R0600 Dyspnea, unspecified: Secondary | ICD-10-CM | POA: Diagnosis not present

## 2021-02-25 DIAGNOSIS — J449 Chronic obstructive pulmonary disease, unspecified: Secondary | ICD-10-CM | POA: Diagnosis not present

## 2021-02-25 DIAGNOSIS — I509 Heart failure, unspecified: Secondary | ICD-10-CM | POA: Diagnosis not present

## 2021-02-25 LAB — BASIC METABOLIC PANEL
Anion gap: 9 (ref 5–15)
BUN: 12 mg/dL (ref 6–20)
CO2: 29 mmol/L (ref 22–32)
Calcium: 8.4 mg/dL — ABNORMAL LOW (ref 8.9–10.3)
Chloride: 102 mmol/L (ref 98–111)
Creatinine, Ser: 0.74 mg/dL (ref 0.44–1.00)
GFR, Estimated: >60 mL/min (ref >60)
Glucose, Bld: 121 mg/dL — ABNORMAL HIGH (ref 70–99)
Potassium: 3.5 mmol/L (ref 3.5–5.1)
Sodium: 140 mmol/L (ref 135–145)

## 2021-02-25 LAB — CBC WITH DIFFERENTIAL/PLATELET
Abs Immature Granulocytes: 0.03 10*3/uL (ref 0.00–0.07)
Basophils Absolute: 0 10*3/uL (ref 0.0–0.1)
Basophils Relative: 0 %
Eosinophils Absolute: 0.2 10*3/uL (ref 0.0–0.5)
Eosinophils Relative: 2 %
HCT: 43.3 % (ref 36.0–46.0)
Hemoglobin: 13.3 g/dL (ref 12.0–15.0)
Immature Granulocytes: 0 %
Lymphocytes Relative: 31 %
Lymphs Abs: 3.2 10*3/uL (ref 0.7–4.0)
MCH: 28.2 pg (ref 26.0–34.0)
MCHC: 30.7 g/dL (ref 30.0–36.0)
MCV: 91.7 fL (ref 80.0–100.0)
Monocytes Absolute: 0.6 10*3/uL (ref 0.1–1.0)
Monocytes Relative: 6 %
Neutro Abs: 6.3 10*3/uL (ref 1.7–7.7)
Neutrophils Relative %: 61 %
Platelets: 236 10*3/uL (ref 150–400)
RBC: 4.72 MIL/uL (ref 3.87–5.11)
RDW: 13.8 % (ref 11.5–15.5)
WBC: 10.4 10*3/uL (ref 4.0–10.5)
nRBC: 0 % (ref 0.0–0.2)

## 2021-02-25 LAB — RESP PANEL BY RT-PCR (FLU A&B, COVID) ARPGX2
Influenza A by PCR: NEGATIVE
Influenza B by PCR: NEGATIVE
SARS Coronavirus 2 by RT PCR: NEGATIVE

## 2021-02-25 MED ORDER — ALBUTEROL SULFATE (2.5 MG/3ML) 0.083% IN NEBU: 2.5000 mg | INHALATION_SOLUTION | Freq: Four times a day (QID) | RESPIRATORY_TRACT | 12 refills | Status: AC | PRN

## 2021-02-25 MED ORDER — ALBUTEROL SULFATE (2.5 MG/3ML) 0.083% IN NEBU
10.0000 mg | INHALATION_SOLUTION | Freq: Once | RESPIRATORY_TRACT | Status: AC
  Administered 2021-02-25: 10 mg via RESPIRATORY_TRACT

## 2021-02-25 MED ORDER — ALBUTEROL SULFATE (2.5 MG/3ML) 0.083% IN NEBU
10.0000 mg/h | INHALATION_SOLUTION | Freq: Once | RESPIRATORY_TRACT | Status: AC
  Administered 2021-02-25: 10 mg/h via RESPIRATORY_TRACT
  Filled 2021-02-25: qty 12

## 2021-02-25 MED ORDER — PREDNISONE 10 MG (21) PO TBPK: ORAL_TABLET | Freq: Every day | ORAL | 0 refills | Status: DC

## 2021-02-25 MED ORDER — ALBUTEROL SULFATE (2.5 MG/3ML) 0.083% IN NEBU
INHALATION_SOLUTION | RESPIRATORY_TRACT | Status: AC
  Filled 2021-02-25: qty 12

## 2021-02-25 MED ORDER — IPRATROPIUM BROMIDE 0.02 % IN SOLN
0.5000 mg | Freq: Once | RESPIRATORY_TRACT | Status: AC
  Administered 2021-02-25: 0.5 mg via RESPIRATORY_TRACT
  Filled 2021-02-25: qty 2.5

## 2021-02-25 MED ORDER — IPRATROPIUM BROMIDE 0.02 % IN SOLN
0.5000 mg | Freq: Once | RESPIRATORY_TRACT | Status: AC
  Administered 2021-02-25: 0.5 mg via RESPIRATORY_TRACT

## 2021-02-25 MED ORDER — METHYLPREDNISOLONE SODIUM SUCC 125 MG IJ SOLR
125.0000 mg | INTRAMUSCULAR | Status: AC
  Administered 2021-02-25: 125 mg via INTRAVENOUS
  Filled 2021-02-25: qty 2

## 2021-02-25 MED ORDER — SODIUM CHLORIDE 0.9 % IV SOLN
INTRAVENOUS | Status: DC
  Administered 2021-02-25: 10 mL/h via INTRAVENOUS

## 2021-02-25 MED ORDER — ALBUTEROL (5 MG/ML) CONTINUOUS INHALATION SOLN
10.0000 mg/h | INHALATION_SOLUTION | Freq: Once | RESPIRATORY_TRACT | Status: DC
  Filled 2021-02-25: qty 20

## 2021-02-25 NOTE — ED Triage Notes (Addendum)
Patient states she had Covid 2 weeks ago. Patient states she has had 5 days of Azithromycin and finished it last week. Patient went to her PCP today because she was still SOB and having a productive cough with clear/white sputum. Patient states her PCP wanted her to have a CXR, and be tested RSV and to receive antibiotics for bronchial infection.  Patient states she uses the CPAP at night and during the day if needed and has been using it more this past week.

## 2021-02-25 NOTE — ED Provider Notes (Addendum)
[facility] DEPT Provider Note   CSN: [id number] Arrival date & time: 02/25/21  1720     History  Chief Complaint  Patient presents with   Shortness of Breath    Jenna Chandler is a 56 y.o. female.  56 year old female with history of COPD, CHF presents with 1 week of worsening shortness of breath.  She has had wheezing which has been unresponsive to her home nebulizer.  She recently completed a course of prednisone a few days ago.  Had been on antibiotics as well 2 as she was being treated for bronchitis.  Notes increased dyspnea exertion.  No lower extremity edema appreciated.  Sent here by her doctor      Home Medications Prior to Admission medications   Medication Sig Start Date End Date Taking? Authorizing Provider  albuterol (PROVENTIL HFA;VENTOLIN HFA) 108 (90 BASE) MCG/ACT inhaler Inhale 2 puffs into the lungs every 6 (six) hours as needed. wheezing 12/29/13   Elsie Stain, MD  albuterol (PROVENTIL) (2.5 MG/3ML) 0.083% nebulizer solution Take 3 mLs (2.5 mg total) by nebulization every 6 (six) hours as needed for wheezing or shortness of breath. 01/27/19   Lauraine Rinne, NP  ALPRAZolam Duanne Moron) 1 MG tablet Take 1 mg by mouth 2 (two) times daily as needed for anxiety.  08/29/18   [provider]  amLODipine (NORVASC) 10 MG tablet TAKE 1 TABLET(10 MG) BY MOUTH TWICE DAILY Patient taking differently: Take 10 mg by mouth daily.  12/26/17   Sueanne Margarita, MD  azithromycin (ZITHROMAX) 250 MG tablet Take two today and then one daily until finished. 01/13/21   Mannam, Hart Robinsons, MD  cloNIDine (CATAPRES) 0.2 MG tablet Take 1 tablet (0.2 mg total) by mouth 2 (two) times daily. 09/10/17   Sueanne Margarita, MD  cyclobenzaprine (FLEXERIL) 10 MG tablet Take 1 tablet (10 mg total) by mouth 2 (two) times daily as needed for muscle spasms. 08/26/13   Kirichenko, Lahoma Rocker, PA-C  doxycycline (VIBRA-TABS) 100 MG tablet Take 1 tablet (100 mg total) by mouth 2  (two) times daily. 03/18/20   Brand Males, MD  furosemide (LASIX) 40 MG tablet Take 80 mg by mouth 2 (two) times daily. 05/18/11   Black, Lezlie Octave, NP  gabapentin (NEURONTIN) 300 MG capsule Take 300 mg by mouth 2 (two) times daily. 800mg  09/12/16   [provider]  gabapentin (NEURONTIN) 800 MG tablet Take 800 mg by mouth 3 (three) times daily. 06/26/19   [provider]  losartan (COZAAR) 100 MG tablet Take 100 mg by mouth daily. 09/26/19   [provider]  Potassium Chloride ER 20 MEQ TBCR Take 1 tablet by mouth daily. 09/29/19   [provider]  predniSONE (DELTASONE) 20 MG tablet Take 2 tablets (40 mg total) by mouth daily with breakfast. 01/13/21   Mannam, Praveen, MD  traZODone (DESYREL) 100 MG tablet TAKE 1 TABLET(100 MG) BY MOUTH AT BEDTIME AS NEEDED FOR SLEEP Patient taking differently: Take 100 mg by mouth at bedtime as needed for sleep.  11/04/18   Martyn Ehrich, NP      Allergies    Lisinopril and Levaquin [levofloxacin]    Review of Systems   Review of Systems  All other systems reviewed and are negative.  Physical Exam Updated Vital Signs BP (!) 155/88 (BP Location: Left Arm)    Pulse 90    Temp 98.3 F (36.8 C) (Oral)    Resp 20    Ht 1.626 m (5'  4")    Wt (!) 194.6 kg    SpO2 100%    BMI 73.64 kg/m  Physical Exam Vitals and nursing note reviewed.  Constitutional:      General: She is not in acute distress.    Appearance: Normal appearance. She is well-developed. She is not toxic-appearing.  HENT:     Head: Normocephalic and atraumatic.  Eyes:     General: Lids are normal.     Conjunctiva/sclera: Conjunctivae normal.     Pupils: Pupils are equal, round, and reactive to light.  Neck:     Thyroid: No thyroid mass.     Trachea: No tracheal deviation.  Cardiovascular:     Rate and Rhythm: Normal rate and regular rhythm.     Heart sounds: Normal heart sounds. No murmur heard.   No gallop.  Pulmonary:     Effort: Pulmonary effort  is normal. Tachypnea present. No respiratory distress.     Breath sounds: No stridor. Examination of the right-lower field reveals decreased breath sounds and wheezing. Examination of the left-lower field reveals decreased breath sounds and wheezing. Decreased breath sounds and wheezing present. No rhonchi or rales.  Abdominal:     General: There is no distension.     Palpations: Abdomen is soft.     Tenderness: There is no abdominal tenderness. There is no rebound.  Musculoskeletal:        General: No tenderness. Normal range of motion.     Cervical back: Normal range of motion and neck supple.  Skin:    General: Skin is warm and dry.     Findings: No abrasion or rash.  Neurological:     Mental Status: She is alert and oriented to person, place, and time. Mental status is at baseline.     GCS: GCS eye subscore is 4. GCS verbal subscore is 5. GCS motor subscore is 6.     Cranial Nerves: No cranial nerve deficit.     Sensory: No sensory deficit.     Motor: Motor function is intact.  Psychiatric:        Attention and Perception: Attention normal.        Speech: Speech normal.        Behavior: Behavior normal.    ED Results / Procedures / Treatments   Labs (all labs ordered are listed, but only abnormal results are displayed) Labs Reviewed  RESP PANEL BY RT-PCR (FLU A&B, COVID) ARPGX2  CBC WITH DIFFERENTIAL/PLATELET  BASIC METABOLIC PANEL  BRAIN NATRIURETIC PEPTIDE    EKG None  Radiology No results found.  Procedures Procedures    Medications Ordered in ED Medications  0.9 %  sodium chloride infusion (has no administration in time range)  methylPREDNISolone sodium succinate (SOLU-MEDROL) 125 mg/2 mL injection 125 mg (has no administration in time range)    ED Course/ Medical Decision Making/ A&P                           Medical Decision Making Amount and/or Complexity of Data Reviewed Labs: ordered.  Risk Prescription drug management.   Patient presented  with shortness of breath and wheezing.  Likely COPD exacerbation.  Low suspicion for PE.  Treated with IV Solu-Medrol as well as albuterol 10 mg continuous treatment along with 0.5 mg of Atrovent.  Unfortunately, patient received about a third of this.  Her breathing did improve slightly but she will need to finish out her treatment.  Chest x-ray was  negative per mitral rotation.  COVID and flu test negative.  She has no evidence of respiratory distress and is sitting in the bed knitting a blanket  Plan will be for her to be reassessed after she finishes her continuous treatment and disposition will be decided by next provider        Final Clinical Impression(s) / ED Diagnoses Final diagnoses:  None    Rx / DC Orders ED Discharge Orders     None         Lacretia Leigh, MD 02/25/21 2253    Lacretia Leigh, MD 02/25/21 2254

## 2021-02-25 NOTE — Discharge Instructions (Addendum)
We saw you in the ER for your breathing related complains. We gave you some breathing treatments in the ER, and seems like your symptoms have improved. °Please take albuterol as needed every 4 hours. °Please take the medications prescribed. °Please refrain from smoking or smoke exposure. °Please see a primary care doctor in 1 week. °Return to the ER if your symptoms worsen. ° °

## 2021-08-22 ENCOUNTER — Emergency Department (HOSPITAL_COMMUNITY)
Admission: EM | Admit: 2021-08-22 | Discharge: 2021-08-22 | Discharge status: Against Medical Advice | Payer: Medicaid Other | Attending: Emergency Medicine | Admitting: Emergency Medicine

## 2021-08-22 ENCOUNTER — Encounter (HOSPITAL_COMMUNITY): Payer: Self-pay

## 2021-08-22 ENCOUNTER — Emergency Department (HOSPITAL_COMMUNITY): Payer: Medicaid Other

## 2021-08-22 ENCOUNTER — Other Ambulatory Visit: Payer: Self-pay

## 2021-08-22 DIAGNOSIS — M791 Myalgia, unspecified site: Secondary | ICD-10-CM | POA: Diagnosis not present

## 2021-08-22 DIAGNOSIS — R0602 Shortness of breath: Secondary | ICD-10-CM | POA: Insufficient documentation

## 2021-08-22 DIAGNOSIS — Z5321 Procedure and treatment not carried out due to patient leaving prior to being seen by health care provider: Secondary | ICD-10-CM | POA: Diagnosis not present

## 2021-08-22 DIAGNOSIS — R059 Cough, unspecified: Secondary | ICD-10-CM | POA: Diagnosis present

## 2021-08-22 LAB — BASIC METABOLIC PANEL
Anion gap: 7 (ref 5–15)
BUN: 10 mg/dL (ref 6–20)
CO2: 28 mmol/L (ref 22–32)
Calcium: 8.9 mg/dL (ref 8.9–10.3)
Chloride: 104 mmol/L (ref 98–111)
Creatinine, Ser: 0.7 mg/dL (ref 0.44–1.00)
GFR, Estimated: >60 mL/min (ref >60)
Glucose, Bld: 100 mg/dL — ABNORMAL HIGH (ref 70–99)
Potassium: 3.4 mmol/L — ABNORMAL LOW (ref 3.5–5.1)
Sodium: 139 mmol/L (ref 135–145)

## 2021-08-22 LAB — CBC
HCT: 45.3 % (ref 36.0–46.0)
Hemoglobin: 13.9 g/dL (ref 12.0–15.0)
MCH: 27.7 pg (ref 26.0–34.0)
MCHC: 30.7 g/dL (ref 30.0–36.0)
MCV: 90.4 fL (ref 80.0–100.0)
Platelets: 233 10*3/uL (ref 150–400)
RBC: 5.01 MIL/uL (ref 3.87–5.11)
RDW: 14.1 % (ref 11.5–15.5)
WBC: 8.4 10*3/uL (ref 4.0–10.5)
nRBC: 0 % (ref 0.0–0.2)

## 2021-08-22 LAB — BRAIN NATRIURETIC PEPTIDE: B Natriuretic Peptide: 61.2 pg/mL (ref 0.0–100.0)

## 2021-08-22 MED ORDER — ALBUTEROL SULFATE HFA 108 (90 BASE) MCG/ACT IN AERS: 2.0000 | INHALATION_SPRAY | RESPIRATORY_TRACT | Status: DC | PRN

## 2021-08-22 NOTE — ED Provider Triage Note (Signed)
Emergency Medicine Provider Triage Evaluation Note  ALAZAY LEICHT , a 56 y.o. female  was evaluated in triage.  Pt complains of cough and shortness of breath  Review of Systems  Positive: productive Negative: fever  Physical Exam  BP (!) 179/109 (BP Location: Left Arm)   Pulse 84   Temp 97.9 F (36.6 C) (Oral)   Resp 18   Ht '5\' 4"'$  (1.626 m)   Wt (!) 208.2 kg   SpO2 96%   BMI 78.79 kg/m  Gen:   Awake, no distress   Resp:  Normal effort. cough MSK:   Moves extremities without difficulty  Other:    Medical Decision Making  Medically screening exam initiated at 2:26 PM.  Appropriate orders placed.  LYNCOLN MASKELL was informed that the remainder of the evaluation will be completed by another provider, this initial triage assessment does not replace that evaluation, and the importance of remaining in the ED until their evaluation is complete.     Fransico Meadow, [place] 08/22/21 1426

## 2021-08-22 NOTE — ED Triage Notes (Addendum)
Per EMS- Patient went to her PCP office today with c/o cough and body aches. Patient reports a history of pneumonia and Covid x 4.   Patient added in triage- Patient states she has a productive cough with brown sputum.

## 2021-12-14 ENCOUNTER — Other Ambulatory Visit (HOSPITAL_COMMUNITY): Payer: Self-pay

## 2021-12-14 MED ORDER — HYDROCODONE-ACETAMINOPHEN 10-325 MG PO TABS
1.0000 | ORAL_TABLET | Freq: Four times a day (QID) | ORAL | 0 refills | Status: DC | PRN
  Filled 2021-12-14: qty 100, 25d supply, fill #0

## 2022-01-03 ENCOUNTER — Other Ambulatory Visit (HOSPITAL_COMMUNITY): Payer: Self-pay

## 2022-01-03 MED ORDER — NYSTATIN 100000 UNIT/GM EX POWD
CUTANEOUS | 0 refills | Status: AC
  Filled 2022-01-03: qty 60, 10d supply, fill #0

## 2022-01-03 MED ORDER — ALPRAZOLAM 1 MG PO TABS
1.0000 mg | ORAL_TABLET | Freq: Every day | ORAL | 0 refills | Status: DC | PRN
  Filled 2022-01-03: qty 30, 30d supply, fill #0

## 2022-01-03 MED ORDER — FLUCONAZOLE 100 MG PO TABS
ORAL_TABLET | ORAL | 0 refills | Status: DC
  Filled 2022-01-03: qty 3, 2d supply, fill #0

## 2022-01-03 MED ORDER — HYDROCODONE-ACETAMINOPHEN 10-325 MG PO TABS
1.0000 | ORAL_TABLET | Freq: Four times a day (QID) | ORAL | 0 refills | Status: DC | PRN
  Filled 2022-01-06: qty 100, 25d supply, fill #0

## 2022-01-04 ENCOUNTER — Other Ambulatory Visit (HOSPITAL_COMMUNITY): Payer: Self-pay

## 2022-01-06 ENCOUNTER — Other Ambulatory Visit (HOSPITAL_COMMUNITY): Payer: Self-pay

## 2022-01-09 ENCOUNTER — Other Ambulatory Visit (HOSPITAL_COMMUNITY): Payer: Self-pay

## 2022-01-23 ENCOUNTER — Other Ambulatory Visit (HOSPITAL_COMMUNITY): Payer: Self-pay

## 2022-04-21 ENCOUNTER — Other Ambulatory Visit (HOSPITAL_COMMUNITY): Payer: Self-pay

## 2022-04-21 MED ORDER — HYDROCODONE-ACETAMINOPHEN 10-325 MG PO TABS
1.0000 | ORAL_TABLET | Freq: Three times a day (TID) | ORAL | 0 refills | Status: DC
  Filled 2022-04-21: qty 21, 7d supply, fill #0

## 2022-04-21 MED ORDER — GABAPENTIN 800 MG PO TABS
800.0000 mg | ORAL_TABLET | Freq: Every day | ORAL | 0 refills | Status: DC
  Filled 2022-04-21: qty 30, 30d supply, fill #0

## 2022-04-21 MED ORDER — NALOXONE HCL 4 MG/0.1ML NA LIQD
NASAL | 0 refills | Status: DC
  Filled 2022-04-21: qty 2, 2d supply, fill #0

## 2022-04-28 ENCOUNTER — Other Ambulatory Visit (HOSPITAL_COMMUNITY): Payer: Self-pay

## 2022-04-28 MED ORDER — DOXYCYCLINE HYCLATE 100 MG PO CAPS
100.0000 mg | ORAL_CAPSULE | Freq: Two times a day (BID) | ORAL | 0 refills | Status: AC
  Filled 2022-04-28: qty 20, 10d supply, fill #0

## 2022-04-28 MED ORDER — ALPRAZOLAM 1 MG PO TABS
1.0000 mg | ORAL_TABLET | Freq: Every day | ORAL | 0 refills | Status: DC | PRN
  Filled 2022-04-28: qty 30, 30d supply, fill #0

## 2022-04-28 MED ORDER — HYDROCODONE-ACETAMINOPHEN 10-325 MG PO TABS
1.0000 | ORAL_TABLET | Freq: Three times a day (TID) | ORAL | 0 refills | Status: DC
  Filled 2022-04-28: qty 90, 30d supply, fill #0

## 2022-04-28 MED ORDER — GABAPENTIN 800 MG PO TABS
800.0000 mg | ORAL_TABLET | Freq: Every day | ORAL | 0 refills | Status: DC
  Filled 2022-04-28 – 2022-05-16 (×2): qty 30, 30d supply, fill #0

## 2022-04-28 MED ORDER — CEPHALEXIN 250 MG PO CAPS
250.0000 mg | ORAL_CAPSULE | Freq: Four times a day (QID) | ORAL | 0 refills | Status: AC
  Filled 2022-04-28: qty 40, 10d supply, fill #0

## 2022-05-02 ENCOUNTER — Other Ambulatory Visit (HOSPITAL_COMMUNITY): Payer: Self-pay

## 2022-05-16 ENCOUNTER — Other Ambulatory Visit (HOSPITAL_COMMUNITY): Payer: Self-pay

## 2022-05-17 ENCOUNTER — Other Ambulatory Visit: Payer: Self-pay

## 2022-05-31 ENCOUNTER — Other Ambulatory Visit (HOSPITAL_COMMUNITY): Payer: Self-pay

## 2022-05-31 MED ORDER — ALPRAZOLAM 2 MG PO TABS
2.0000 mg | ORAL_TABLET | Freq: Every day | ORAL | 0 refills | Status: DC | PRN
  Filled 2022-05-31: qty 30, 30d supply, fill #0

## 2022-05-31 MED ORDER — HYDROCODONE-ACETAMINOPHEN 10-325 MG PO TABS
1.0000 | ORAL_TABLET | Freq: Three times a day (TID) | ORAL | 0 refills | Status: DC
  Filled 2022-05-31: qty 90, 30d supply, fill #0

## 2022-05-31 MED ORDER — PHENTERMINE HCL 30 MG PO CAPS
30.0000 mg | ORAL_CAPSULE | Freq: Every day | ORAL | 0 refills | Status: DC
  Filled 2022-05-31: qty 30, 30d supply, fill #0

## 2022-05-31 MED ORDER — NALOXONE HCL 4 MG/0.1ML NA LIQD
4.0000 mg | NASAL | 0 refills | Status: DC | PRN
  Filled 2022-05-31: qty 2, 2d supply, fill #0

## 2022-06-08 ENCOUNTER — Other Ambulatory Visit (HOSPITAL_COMMUNITY): Payer: Self-pay

## 2022-06-09 ENCOUNTER — Other Ambulatory Visit (HOSPITAL_COMMUNITY): Payer: Self-pay

## 2022-06-14 ENCOUNTER — Other Ambulatory Visit (HOSPITAL_COMMUNITY): Payer: Self-pay

## 2022-06-20 ENCOUNTER — Other Ambulatory Visit (HOSPITAL_COMMUNITY): Payer: Self-pay

## 2022-06-30 ENCOUNTER — Other Ambulatory Visit (HOSPITAL_COMMUNITY): Payer: Self-pay

## 2022-06-30 MED ORDER — NALOXONE HCL 4 MG/0.1ML NA LIQD
1.0000 | NASAL | 0 refills | Status: AC | PRN
  Filled 2022-06-30: qty 2, 2d supply, fill #0

## 2022-06-30 MED ORDER — HYDROCODONE-ACETAMINOPHEN 10-325 MG PO TABS
1.0000 | ORAL_TABLET | Freq: Three times a day (TID) | ORAL | 0 refills | Status: DC
  Filled 2022-06-30: qty 90, 30d supply, fill #0

## 2022-07-04 ENCOUNTER — Other Ambulatory Visit (HOSPITAL_COMMUNITY): Payer: Self-pay

## 2022-07-05 ENCOUNTER — Other Ambulatory Visit (HOSPITAL_COMMUNITY): Payer: Self-pay

## 2022-07-17 ENCOUNTER — Other Ambulatory Visit (HOSPITAL_COMMUNITY): Payer: Self-pay

## 2022-07-18 ENCOUNTER — Other Ambulatory Visit (HOSPITAL_COMMUNITY): Payer: Self-pay

## 2022-07-24 ENCOUNTER — Other Ambulatory Visit (HOSPITAL_COMMUNITY): Payer: Self-pay

## 2022-07-27 ENCOUNTER — Other Ambulatory Visit (HOSPITAL_COMMUNITY): Payer: Self-pay

## 2022-07-28 ENCOUNTER — Other Ambulatory Visit (HOSPITAL_COMMUNITY): Payer: Self-pay

## 2022-07-31 ENCOUNTER — Other Ambulatory Visit (HOSPITAL_COMMUNITY): Payer: Self-pay

## 2022-09-15 ENCOUNTER — Other Ambulatory Visit: Payer: Self-pay | Admitting: Internal Medicine

## 2022-09-21 ENCOUNTER — Other Ambulatory Visit (HOSPITAL_COMMUNITY): Payer: Self-pay

## 2022-10-28 ENCOUNTER — Other Ambulatory Visit (HOSPITAL_COMMUNITY): Payer: Self-pay

## 2023-03-04 ENCOUNTER — Inpatient Hospital Stay (HOSPITAL_COMMUNITY)
Admission: EM | Admit: 2023-03-04 | Discharge: 2023-03-09 | DRG: 193 | Disposition: A | Discharge status: Home / Self Care | Payer: Medicaid Other | Attending: Internal Medicine | Admitting: Internal Medicine

## 2023-03-04 ENCOUNTER — Encounter (HOSPITAL_COMMUNITY): Payer: Self-pay | Admitting: Emergency Medicine

## 2023-03-04 ENCOUNTER — Emergency Department (HOSPITAL_COMMUNITY): Payer: Medicaid Other

## 2023-03-04 ENCOUNTER — Other Ambulatory Visit: Payer: Self-pay

## 2023-03-04 DIAGNOSIS — J9610 Chronic respiratory failure, unspecified whether with hypoxia or hypercapnia: Secondary | ICD-10-CM | POA: Diagnosis present

## 2023-03-04 DIAGNOSIS — G4733 Obstructive sleep apnea (adult) (pediatric): Secondary | ICD-10-CM | POA: Diagnosis present

## 2023-03-04 DIAGNOSIS — J9620 Acute and chronic respiratory failure, unspecified whether with hypoxia or hypercapnia: Secondary | ICD-10-CM | POA: Diagnosis present

## 2023-03-04 DIAGNOSIS — R001 Bradycardia, unspecified: Secondary | ICD-10-CM | POA: Diagnosis not present

## 2023-03-04 DIAGNOSIS — Z833 Family history of diabetes mellitus: Secondary | ICD-10-CM

## 2023-03-04 DIAGNOSIS — M797 Fibromyalgia: Secondary | ICD-10-CM | POA: Diagnosis present

## 2023-03-04 DIAGNOSIS — F32A Depression, unspecified: Secondary | ICD-10-CM | POA: Diagnosis present

## 2023-03-04 DIAGNOSIS — Z713 Dietary counseling and surveillance: Secondary | ICD-10-CM

## 2023-03-04 DIAGNOSIS — Z7951 Long term (current) use of inhaled steroids: Secondary | ICD-10-CM | POA: Diagnosis not present

## 2023-03-04 DIAGNOSIS — F411 Generalized anxiety disorder: Secondary | ICD-10-CM | POA: Diagnosis present

## 2023-03-04 DIAGNOSIS — N1831 Chronic kidney disease, stage 3a: Secondary | ICD-10-CM | POA: Diagnosis present

## 2023-03-04 DIAGNOSIS — J101 Influenza due to other identified influenza virus with other respiratory manifestations: Secondary | ICD-10-CM | POA: Diagnosis not present

## 2023-03-04 DIAGNOSIS — N179 Acute kidney failure, unspecified: Secondary | ICD-10-CM | POA: Diagnosis present

## 2023-03-04 DIAGNOSIS — Z888 Allergy status to other drugs, medicaments and biological substances status: Secondary | ICD-10-CM | POA: Diagnosis not present

## 2023-03-04 DIAGNOSIS — J1100 Influenza due to unidentified influenza virus with unspecified type of pneumonia: Secondary | ICD-10-CM | POA: Diagnosis not present

## 2023-03-04 DIAGNOSIS — E662 Morbid (severe) obesity with alveolar hypoventilation: Secondary | ICD-10-CM | POA: Diagnosis present

## 2023-03-04 DIAGNOSIS — Z79899 Other long term (current) drug therapy: Secondary | ICD-10-CM | POA: Diagnosis not present

## 2023-03-04 DIAGNOSIS — E876 Hypokalemia: Secondary | ICD-10-CM | POA: Diagnosis present

## 2023-03-04 DIAGNOSIS — Z7982 Long term (current) use of aspirin: Secondary | ICD-10-CM | POA: Diagnosis not present

## 2023-03-04 DIAGNOSIS — E1122 Type 2 diabetes mellitus with diabetic chronic kidney disease: Secondary | ICD-10-CM | POA: Diagnosis present

## 2023-03-04 DIAGNOSIS — R269 Unspecified abnormalities of gait and mobility: Secondary | ICD-10-CM | POA: Diagnosis present

## 2023-03-04 DIAGNOSIS — I130 Hypertensive heart and chronic kidney disease with heart failure and stage 1 through stage 4 chronic kidney disease, or unspecified chronic kidney disease: Secondary | ICD-10-CM | POA: Diagnosis present

## 2023-03-04 DIAGNOSIS — Z1152 Encounter for screening for COVID-19: Secondary | ICD-10-CM | POA: Diagnosis not present

## 2023-03-04 DIAGNOSIS — E66813 Obesity, class 3: Secondary | ICD-10-CM | POA: Diagnosis present

## 2023-03-04 DIAGNOSIS — I10 Essential (primary) hypertension: Secondary | ICD-10-CM | POA: Diagnosis present

## 2023-03-04 DIAGNOSIS — J4489 Other specified chronic obstructive pulmonary disease: Secondary | ICD-10-CM | POA: Diagnosis present

## 2023-03-04 DIAGNOSIS — Z6841 Body Mass Index (BMI) 40.0 and over, adult: Secondary | ICD-10-CM

## 2023-03-04 DIAGNOSIS — Z87891 Personal history of nicotine dependence: Secondary | ICD-10-CM

## 2023-03-04 DIAGNOSIS — J9621 Acute and chronic respiratory failure with hypoxia: Secondary | ICD-10-CM | POA: Diagnosis present

## 2023-03-04 DIAGNOSIS — R5381 Other malaise: Secondary | ICD-10-CM | POA: Diagnosis present

## 2023-03-04 DIAGNOSIS — I5032 Chronic diastolic (congestive) heart failure: Secondary | ICD-10-CM | POA: Diagnosis present

## 2023-03-04 DIAGNOSIS — J9601 Acute respiratory failure with hypoxia: Secondary | ICD-10-CM | POA: Diagnosis not present

## 2023-03-04 DIAGNOSIS — J441 Chronic obstructive pulmonary disease with (acute) exacerbation: Secondary | ICD-10-CM | POA: Diagnosis present

## 2023-03-04 DIAGNOSIS — Z881 Allergy status to other antibiotic agents status: Secondary | ICD-10-CM

## 2023-03-04 DIAGNOSIS — J984 Other disorders of lung: Secondary | ICD-10-CM | POA: Diagnosis present

## 2023-03-04 DIAGNOSIS — R079 Chest pain, unspecified: Secondary | ICD-10-CM | POA: Diagnosis not present

## 2023-03-04 LAB — RESP PANEL BY RT-PCR (RSV, FLU A&B, COVID)  RVPGX2
Influenza A by PCR: POSITIVE — AB
Influenza B by PCR: NEGATIVE
Resp Syncytial Virus by PCR: NEGATIVE
SARS Coronavirus 2 by RT PCR: NEGATIVE

## 2023-03-04 LAB — CBC WITH DIFFERENTIAL/PLATELET
Abs Immature Granulocytes: 0.01 10*3/uL (ref 0.00–0.07)
Basophils Absolute: 0 10*3/uL (ref 0.0–0.1)
Basophils Relative: 1 %
Eosinophils Absolute: 0 10*3/uL (ref 0.0–0.5)
Eosinophils Relative: 0 %
HCT: 45.5 % (ref 36.0–46.0)
Hemoglobin: 14.5 g/dL (ref 12.0–15.0)
Immature Granulocytes: 0 %
Lymphocytes Relative: 20 %
Lymphs Abs: 0.8 10*3/uL (ref 0.7–4.0)
MCH: 29.1 pg (ref 26.0–34.0)
MCHC: 31.9 g/dL (ref 30.0–36.0)
MCV: 91.4 fL (ref 80.0–100.0)
Monocytes Absolute: 0.8 10*3/uL (ref 0.1–1.0)
Monocytes Relative: 21 %
Neutro Abs: 2.2 10*3/uL (ref 1.7–7.7)
Neutrophils Relative %: 58 %
Platelets: 186 10*3/uL (ref 150–400)
RBC: 4.98 MIL/uL (ref 3.87–5.11)
RDW: 13.5 % (ref 11.5–15.5)
WBC: 3.8 10*3/uL — ABNORMAL LOW (ref 4.0–10.5)
nRBC: 0 % (ref 0.0–0.2)

## 2023-03-04 LAB — BASIC METABOLIC PANEL
Anion gap: 14 (ref 5–15)
BUN: 7 mg/dL (ref 6–20)
CO2: 29 mmol/L (ref 22–32)
Calcium: 8.6 mg/dL — ABNORMAL LOW (ref 8.9–10.3)
Chloride: 96 mmol/L — ABNORMAL LOW (ref 98–111)
Creatinine, Ser: 1.09 mg/dL — ABNORMAL HIGH (ref 0.44–1.00)
GFR, Estimated: 59 mL/min — ABNORMAL LOW (ref >60)
Glucose, Bld: 103 mg/dL — ABNORMAL HIGH (ref 70–99)
Potassium: 3.2 mmol/L — ABNORMAL LOW (ref 3.5–5.1)
Sodium: 139 mmol/L (ref 135–145)

## 2023-03-04 MED ORDER — OSELTAMIVIR PHOSPHATE 75 MG PO CAPS
75.0000 mg | ORAL_CAPSULE | Freq: Two times a day (BID) | ORAL | Status: AC
  Administered 2023-03-05 – 2023-03-09 (×10): 75 mg via ORAL
  Filled 2023-03-04 (×10): qty 1

## 2023-03-04 MED ORDER — LACTATED RINGERS IV SOLN: INTRAVENOUS | Status: AC

## 2023-03-04 MED ORDER — PREDNISONE 20 MG PO TABS
60.0000 mg | ORAL_TABLET | Freq: Once | ORAL | Status: AC
Start: 2023-03-04 — End: 2023-03-04
  Administered 2023-03-04: 60 mg via ORAL
  Filled 2023-03-04: qty 3

## 2023-03-04 MED ORDER — SODIUM CHLORIDE 0.9 % IV SOLN
100.0000 mg | Freq: Two times a day (BID) | INTRAVENOUS | Status: DC
  Administered 2023-03-05 – 2023-03-08 (×8): 100 mg via INTRAVENOUS
  Filled 2023-03-04 (×8): qty 100

## 2023-03-04 MED ORDER — METOPROLOL TARTRATE 5 MG/5ML IV SOLN: 5.0000 mg | Freq: Four times a day (QID) | INTRAVENOUS | Status: DC | PRN

## 2023-03-04 MED ORDER — ACETAMINOPHEN 325 MG PO TABS
650.0000 mg | ORAL_TABLET | Freq: Once | ORAL | Status: AC
  Administered 2023-03-04: 650 mg via ORAL
  Filled 2023-03-04: qty 2

## 2023-03-04 MED ORDER — ENOXAPARIN SODIUM 100 MG/ML IJ SOSY
90.0000 mg | PREFILLED_SYRINGE | INTRAMUSCULAR | Status: DC
Start: 2023-03-05 — End: 2023-03-09
  Administered 2023-03-05 – 2023-03-09 (×5): 90 mg via SUBCUTANEOUS
  Filled 2023-03-04 (×5): qty 0.9

## 2023-03-04 MED ORDER — ALBUTEROL SULFATE (2.5 MG/3ML) 0.083% IN NEBU
2.5000 mg | INHALATION_SOLUTION | Freq: Once | RESPIRATORY_TRACT | Status: AC
  Administered 2023-03-04: 2.5 mg via RESPIRATORY_TRACT
  Filled 2023-03-04: qty 3

## 2023-03-04 MED ORDER — ALPRAZOLAM 0.5 MG PO TABS
2.0000 mg | ORAL_TABLET | Freq: Every day | ORAL | Status: DC | PRN
  Administered 2023-03-07 – 2023-03-08 (×2): 2 mg via ORAL
  Filled 2023-03-04 (×2): qty 4

## 2023-03-04 MED ORDER — IPRATROPIUM-ALBUTEROL 0.5-2.5 (3) MG/3ML IN SOLN
3.0000 mL | RESPIRATORY_TRACT | Status: AC
  Administered 2023-03-04 (×2): 3 mL via RESPIRATORY_TRACT
  Filled 2023-03-04 (×2): qty 3

## 2023-03-04 MED ORDER — METHYLPREDNISOLONE SODIUM SUCC 125 MG IJ SOLR: 125.0000 mg | Freq: Two times a day (BID) | INTRAMUSCULAR | Status: DC

## 2023-03-04 NOTE — ED Notes (Signed)
Assumed care of pt, found her alert and oriented sitting up in bed.  Pt was worried about her O2 level reading low however RN explained that her O2 sensor was on the same hand as the bp cuff that was inflating.  Pt indciated that she continues to have a headache but other than that is "OK".

## 2023-03-04 NOTE — ED Notes (Signed)
Pt placed on hospital bed for comfort.  Pt now has a diet order so food given.  Pt's O2 stats dropped when moving to another bed, placed on 2L .   Fan given per her requests.

## 2023-03-04 NOTE — H&P (Incomplete)
History and Physical    Patient: Jenna Chandler:[id number] DOB: May 06, 1965 DOA: 03/04/2023 DOS: the patient was seen and examined on 03/04/2023 PCP: Fleet Contras, MD  Patient coming from: {Point_of_Origin:[address]}  Chief Complaint:  Chief Complaint  Patient presents with  . Shortness of Breath  . Fever   HPI: Jenna Chandler is a 58 y.o. female with medical history significant of ***  Review of Systems: {ROS_Text:26778} Past Medical History:  Diagnosis Date  . Anxiety   . Asthma   . Chronic diastolic CHF (congestive heart failure) ([name])   . Chronic kidney disease   . COPD (chronic obstructive pulmonary disease) ([name])    +/- asthma   . Depression   . Diabetes mellitus without complication ([name])   . Dyspnea   . Enlarged heart   . Fibromyalgia   . Hypertension   . Manic, depressive ([name])   . Morbid obesity ([name])   . OSA (obstructive sleep apnea)    Past Surgical History:  Procedure Laterality Date  . NO PAST SURGERIES     Social History:  reports that she quit smoking about 20 years ago. Her smoking use included cigarettes. She started smoking about 35 years ago. She has a 30 pack-year smoking history. She has never used smokeless tobacco. She reports current drug use. She reports that she does not drink alcohol.  Allergies  Allergen Reactions  . Lisinopril Cough  . Levaquin [Levofloxacin]     Nauseated     Family History  Problem Relation Age of Onset  . Diabetes Father   . Cancer Mother   . Diabetes Mother     Prior to Admission medications   Medication Sig Start Date End Date Taking? Authorizing Provider  albuterol (PROVENTIL HFA;VENTOLIN HFA) 108 (90 BASE) MCG/ACT inhaler Inhale 2 puffs into the lungs every 6 (six) hours as needed. wheezing 12/29/13   Storm Frisk, MD  albuterol (PROVENTIL) (2.5 MG/3ML) 0.083% nebulizer solution Take 3 mLs (2.5 mg total) by nebulization every 6 (six) hours as needed for wheezing or shortness of breath. Patient not  taking: Reported on 02/25/2021 01/27/19   Coral Ceo, NP  albuterol (PROVENTIL) (2.5 MG/3ML) 0.083% nebulizer solution Take 3 mLs (2.5 mg total) by nebulization every 6 (six) hours as needed for wheezing or shortness of breath. 02/25/21   Lorre Nick, MD  ALPRAZolam Prudy Feeler) 1 MG tablet Take 1 mg by mouth 2 (two) times daily as needed for anxiety.  08/29/18   [provider]  ALPRAZolam Prudy Feeler) 1 MG tablet Take 1 tablet (1 mg total) by mouth daily as needed. 01/03/22     ALPRAZolam (XANAX) 1 MG tablet Take 1 tablet (1 mg total) by mouth daily as needed. 04/28/22     alprazolam (XANAX) 2 MG tablet Take 1 tablet (2 mg total) by mouth daily as needed. 05/31/22     amLODipine (NORVASC) 10 MG tablet TAKE 1 TABLET(10 MG) BY MOUTH TWICE DAILY Patient taking differently: Take 10 mg by mouth 2 (two) times daily. 12/26/17   Quintella Reichert, MD  aspirin 325 MG tablet Take 325 mg by mouth daily. 09/09/20   [provider]  azithromycin (ZITHROMAX) 250 MG tablet Take two today and then one daily until finished. Patient not taking: Reported on 02/25/2021 01/13/21   Chilton Greathouse, MD  Brimonidine Tartrate (LUMIFY) 0.025 % SOLN Place 2 drops into both eyes daily as needed (redness).    [provider]  cloNIDine (CATAPRES) 0.2 MG tablet Take 1 tablet (  0.2 mg total) by mouth 2 (two) times daily. 09/10/17   Quintella Reichert, MD  cyclobenzaprine (FLEXERIL) 10 MG tablet Take 1 tablet (10 mg total) by mouth 2 (two) times daily as needed for muscle spasms. 08/26/13   Kirichenko, Tatyana, PA-C  Dextran 70-Hypromellose (ARTIFICIAL TEARS) 0.1-0.3 % SOLN Place 2 drops into both eyes daily as needed (dry eyes).    [provider]  doxycycline (VIBRA-TABS) 100 MG tablet Take 1 tablet (100 mg total) by mouth 2 (two) times daily. Patient not taking: Reported on 02/25/2021 03/18/20   Kalman Shan, MD  fluconazole (DIFLUCAN) 100 MG tablet Take 1 and 1/2 tablets now.  Should symptoms persist after  72 hours, may take remaining 1 and 1/2 tablets. 01/03/22     furosemide (LASIX) 40 MG tablet Take 80 mg by mouth 2 (two) times daily. 05/18/11   Black, Lesle Chris, NP  gabapentin (NEURONTIN) 300 MG capsule Take 300 mg by mouth 2 (two) times daily. 800mg  Patient not taking: Reported on 02/25/2021 09/12/16   [provider]  gabapentin (NEURONTIN) 800 MG tablet Take 800 mg by mouth daily as needed (pain). 06/26/19   [provider]  gabapentin (NEURONTIN) 800 MG tablet Take 1 tablet (800 mg total) by mouth daily. 04/28/22     HYDROcodone-acetaminophen (NORCO) 10-325 MG tablet Take 1 tablet by mouth every 8 (eight) hours as needed for severe pain. 01/17/21   [provider]  HYDROcodone-acetaminophen (NORCO) 10-325 MG tablet Take 1 tablet by mouth every 6 (six) hours as needed for pain 01/03/22     HYDROcodone-acetaminophen (NORCO) 10-325 MG tablet Take 1 tablet by mouth 3 (three) times daily. 06/30/22     losartan (COZAAR) 100 MG tablet Take 100 mg by mouth daily. 09/26/19   [provider]  MUCUS RELIEF 600 MG 12 hr tablet Take 600 mg by mouth 2 (two) times daily as needed for cough. 02/14/21   [provider]  naloxone [facility]) nasal spray 4 mg/0.1 mL Use 1 (one) Spray as directed as needed. 04/21/22     naloxone (NARCAN) nasal spray 4 mg/0.1 mL Place 1 spray (4 mg total) into the nose as needed. 05/31/22     naloxone (NARCAN) nasal spray 4 mg/0.1 mL Place 1 spray into the nose as needed. 06/30/22     nystatin (MYCOSTATIN/NYSTOP) powder Apply to affected area 3 times per day. 01/03/22     phentermine 30 MG capsule Take 1 capsule (30 mg total) by mouth daily. 05/31/22     Potassium Chloride ER 20 MEQ TBCR Take 20 mEq by mouth daily as needed (potassium deficiency). 09/29/19   [provider]  predniSONE (DELTASONE) 20 MG tablet Take 2 tablets (40 mg total) by mouth daily with breakfast. Patient not taking: Reported on 02/25/2021 01/13/21   Chilton Greathouse, MD   predniSONE (STERAPRED UNI-PAK 21 TAB) 10 MG (21) TBPK tablet Take by mouth daily. Take 6 tabs by mouth daily  for 2 days, then 5 tabs for 2 days, then 4 tabs for 2 days, then 3 tabs for 2 days, 2 tabs for 2 days, then 1 tab by mouth daily for 2 days 02/25/21   Lorre Nick, MD  sodium chloride (OCEAN) 0.65 % SOLN nasal spray Place 1 spray into both nostrils daily.    [provider]  traZODone (DESYREL) 100 MG tablet TAKE 1 TABLET(100 MG) BY MOUTH AT BEDTIME AS NEEDED FOR SLEEP Patient taking differently: Take 100 mg by mouth at bedtime as needed for  sleep. 11/04/18   Glenford Bayley, NP  Vitamin D, Ergocalciferol, (DRISDOL) 1.25 MG (50000 UNIT) CAPS capsule Take 50,000 Units by mouth once a week. Mondays 12/09/20   [provider]    Physical Exam: Vitals:   03/04/23 2304 03/04/23 2315 03/04/23 2323 03/04/23 2325  BP:  (!) 146/81    Pulse: 87 92 86 84  Resp: 15 18 (!) 21 (!) 22  Temp:      TempSrc:      SpO2: 92% 92% 94% 92%  Weight:      Height:       *** Data Reviewed: {Tip this will not be part of the note when signed- Document your independent interpretation of telemetry tracing, EKG, lab, Radiology test or any other diagnostic tests. Add any new diagnostic test ordered today. (Optional):26781} {Results:26384}  Assessment and Plan: No notes have been filed under this hospital service. Service: Hospitalist     Advance Care Planning:   Code Status: Full Code ***  Consults: ***  Family Communication: ***  Severity of Illness: {Observation/Inpatient:21159}  AuthorLonia Blood, MD 03/04/2023 11:30 PM  For on call review [email or web address].

## 2023-03-04 NOTE — ED Triage Notes (Signed)
Pt arrives via EMS from home with fever since Friday, Saturday, last night became more SOB. Today with increased SOB, wheezing. Pt states was using home nebulizer with little relief. Pt awake, alert, appropriate.

## 2023-03-04 NOTE — ED Provider Notes (Signed)
[place] [facility]GENC[facility] DEPARTMENT AT [facility] Provider Note   CSN: [id number] Arrival date & time: 03/04/23  2010     History  Chief Complaint  Patient presents with   Shortness of Breath   Fever    Jenna Chandler is a 58 y.o. female.  Patient is a 58 year old female with a past medical history of COPD, CHF, fibromyalgia, hypertension, obesity presenting to the emergency department with cough and shortness of breath.  The patient states that she started to feel sick on Friday.  She states that she woke up on Saturday with a fever.  She states that she has been coughing and congested.  She states over the last 2 days she has had increasing shortness of breath.  She states that she has needed her breathing treatments more often at home.  She states she only has chest pain when she coughs.  She states that multiple of her family members have been sick with similar symptoms.  Patient also reports that she has had increased pain in her left foot over the last several weeks.  Denies any trauma or falls.  Denies any lower extremity swelling.  Patient was wheezing on EMS arrival and did receive 1 DuoNeb and route.  The history is provided by the patient and the EMS personnel.  Shortness of Breath Associated symptoms: fever   Fever      Home Medications Prior to Admission medications   Medication Sig Start Date End Date Taking? Authorizing Provider  albuterol (PROVENTIL HFA;VENTOLIN HFA) 108 (90 BASE) MCG/ACT inhaler Inhale 2 puffs into the lungs every 6 (six) hours as needed. wheezing 12/29/13   Storm Frisk, MD  albuterol (PROVENTIL) (2.5 MG/3ML) 0.083% nebulizer solution Take 3 mLs (2.5 mg total) by nebulization every 6 (six) hours as needed for wheezing or shortness of breath. Patient not taking: Reported on 02/25/2021 01/27/19   Coral Ceo, NP  albuterol (PROVENTIL) (2.5 MG/3ML) 0.083% nebulizer solution Take 3 mLs (2.5 mg total) by nebulization every 6 (six) hours as  needed for wheezing or shortness of breath. 02/25/21   Lorre Nick, MD  ALPRAZolam Prudy Feeler) 1 MG tablet Take 1 mg by mouth 2 (two) times daily as needed for anxiety.  08/29/18   [provider]  ALPRAZolam Prudy Feeler) 1 MG tablet Take 1 tablet (1 mg total) by mouth daily as needed. 01/03/22     ALPRAZolam (XANAX) 1 MG tablet Take 1 tablet (1 mg total) by mouth daily as needed. 04/28/22     alprazolam (XANAX) 2 MG tablet Take 1 tablet (2 mg total) by mouth daily as needed. 05/31/22     amLODipine (NORVASC) 10 MG tablet TAKE 1 TABLET(10 MG) BY MOUTH TWICE DAILY Patient taking differently: Take 10 mg by mouth 2 (two) times daily. 12/26/17   Quintella Reichert, MD  aspirin 325 MG tablet Take 325 mg by mouth daily. 09/09/20   [provider]  azithromycin (ZITHROMAX) 250 MG tablet Take two today and then one daily until finished. Patient not taking: Reported on 02/25/2021 01/13/21   Chilton Greathouse, MD  Brimonidine Tartrate (LUMIFY) 0.025 % SOLN Place 2 drops into both eyes daily as needed (redness).    [provider]  cloNIDine (CATAPRES) 0.2 MG tablet Take 1 tablet (0.2 mg total) by mouth 2 (two) times daily. 09/10/17   Quintella Reichert, MD  cyclobenzaprine (FLEXERIL) 10 MG tablet Take 1 tablet (10 mg total) by mouth 2 (two) times daily as needed for muscle  spasms. 08/26/13   Kirichenko, Tatyana, PA-C  Dextran 70-Hypromellose (ARTIFICIAL TEARS) 0.1-0.3 % SOLN Place 2 drops into both eyes daily as needed (dry eyes).    [provider]  doxycycline (VIBRA-TABS) 100 MG tablet Take 1 tablet (100 mg total) by mouth 2 (two) times daily. Patient not taking: Reported on 02/25/2021 03/18/20   Kalman Shan, MD  fluconazole (DIFLUCAN) 100 MG tablet Take 1 and 1/2 tablets now.  Should symptoms persist after 72 hours, may take remaining 1 and 1/2 tablets. 01/03/22     furosemide (LASIX) 40 MG tablet Take 80 mg by mouth 2 (two) times daily. 05/18/11   Black, Lesle Chris, NP  gabapentin (NEURONTIN)  300 MG capsule Take 300 mg by mouth 2 (two) times daily. 800mg  Patient not taking: Reported on 02/25/2021 09/12/16   [provider]  gabapentin (NEURONTIN) 800 MG tablet Take 800 mg by mouth daily as needed (pain). 06/26/19   [provider]  gabapentin (NEURONTIN) 800 MG tablet Take 1 tablet (800 mg total) by mouth daily. 04/28/22     HYDROcodone-acetaminophen (NORCO) 10-325 MG tablet Take 1 tablet by mouth every 8 (eight) hours as needed for severe pain. 01/17/21   [provider]  HYDROcodone-acetaminophen (NORCO) 10-325 MG tablet Take 1 tablet by mouth every 6 (six) hours as needed for pain 01/03/22     HYDROcodone-acetaminophen (NORCO) 10-325 MG tablet Take 1 tablet by mouth 3 (three) times daily. 06/30/22     losartan (COZAAR) 100 MG tablet Take 100 mg by mouth daily. 09/26/19   [provider]  MUCUS RELIEF 600 MG 12 hr tablet Take 600 mg by mouth 2 (two) times daily as needed for cough. 02/14/21   [provider]  naloxone [facility]) nasal spray 4 mg/0.1 mL Use 1 (one) Spray as directed as needed. 04/21/22     naloxone (NARCAN) nasal spray 4 mg/0.1 mL Place 1 spray (4 mg total) into the nose as needed. 05/31/22     naloxone (NARCAN) nasal spray 4 mg/0.1 mL Place 1 spray into the nose as needed. 06/30/22     nystatin (MYCOSTATIN/NYSTOP) powder Apply to affected area 3 times per day. 01/03/22     phentermine 30 MG capsule Take 1 capsule (30 mg total) by mouth daily. 05/31/22     Potassium Chloride ER 20 MEQ TBCR Take 20 mEq by mouth daily as needed (potassium deficiency). 09/29/19   [provider]  predniSONE (DELTASONE) 20 MG tablet Take 2 tablets (40 mg total) by mouth daily with breakfast. Patient not taking: Reported on 02/25/2021 01/13/21   Chilton Greathouse, MD  predniSONE (STERAPRED UNI-PAK 21 TAB) 10 MG (21) TBPK tablet Take by mouth daily. Take 6 tabs by mouth daily  for 2 days, then 5 tabs for 2 days, then 4 tabs for 2 days, then 3 tabs for 2 days,  2 tabs for 2 days, then 1 tab by mouth daily for 2 days 02/25/21   Lorre Nick, MD  sodium chloride (OCEAN) 0.65 % SOLN nasal spray Place 1 spray into both nostrils daily.    [provider]  traZODone (DESYREL) 100 MG tablet TAKE 1 TABLET(100 MG) BY MOUTH AT BEDTIME AS NEEDED FOR SLEEP Patient taking differently: Take 100 mg by mouth at bedtime as needed for sleep. 11/04/18   Glenford Bayley, NP  Vitamin D, Ergocalciferol, (DRISDOL) 1.25 MG (50000 UNIT) CAPS capsule Take 50,000 Units by mouth once a week. Mondays 12/09/20   [provider]  Allergies    Lisinopril and Levaquin [levofloxacin]    Review of Systems   Review of Systems  Constitutional:  Positive for fever.  Respiratory:  Positive for shortness of breath.     Physical Exam Updated Vital Signs BP 134/77 (BP Location: Right Arm)   Pulse 88   Temp 98.9 F (37.2 C) (Oral)   Resp 17   Ht 5\' 4"  (1.626 m)   Wt (!) 195 kg   SpO2 93%   BMI 73.81 kg/m  Physical Exam Vitals and nursing note reviewed.  Constitutional:      General: She is not in acute distress.    Appearance: She is well-developed. She is obese. She is ill-appearing.  HENT:     Head: Normocephalic.     Mouth/Throat:     Mouth: Mucous membranes are moist.     Pharynx: Oropharynx is clear.  Eyes:     Extraocular Movements: Extraocular movements intact.  Cardiovascular:     Rate and Rhythm: Normal rate and regular rhythm.  Pulmonary:     Effort: Tachypnea present. No accessory muscle usage or respiratory distress.     Breath sounds: Wheezing (Diffuse inspiratory and expiratory) present.  Abdominal:     Palpations: Abdomen is soft.     Tenderness: There is no abdominal tenderness.  Musculoskeletal:        General: Normal range of motion.     Cervical back: Normal range of motion and neck supple.     Right lower leg: No edema.     Left lower leg: No edema.     Comments: Tenderness to palpation of L foot near base of 2nd and  3rd toes, no overlying skin changes  Skin:    General: Skin is warm and dry.  Neurological:     General: No focal deficit present.     Mental Status: She is alert and oriented to person, place, and time.  Psychiatric:        Mood and Affect: Mood normal.        Behavior: Behavior normal.     ED Results / Procedures / Treatments   Labs (all labs ordered are listed, but only abnormal results are displayed) Labs Reviewed  RESP PANEL BY RT-PCR (RSV, FLU A&B, COVID)  RVPGX2 - Abnormal; Notable for the following components:      Result Value   Influenza A by PCR POSITIVE (*)    All other components within normal limits  BASIC METABOLIC PANEL - Abnormal; Notable for the following components:   Potassium 3.2 (*)    Chloride 96 (*)    Glucose, Bld 103 (*)    Creatinine, Ser 1.09 (*)    Calcium 8.6 (*)    GFR, Estimated 59 (*)    All other components within normal limits  CBC WITH DIFFERENTIAL/PLATELET - Abnormal; Notable for the following components:   WBC 3.8 (*)    All other components within normal limits  URINALYSIS, ROUTINE W REFLEX MICROSCOPIC    EKG None  Radiology DG Chest Port 1 View Result Date: 03/04/2023 CLINICAL DATA:  SOB EXAM: PORTABLE CHEST 1 VIEW COMPARISON:  Chest x-ray 08/22/2021 FINDINGS: Limited evaluation due to overlapping osseous structures and overlying soft tissues. The heart and mediastinal contours are unchanged. No focal consolidation. No pulmonary edema. No pleural effusion. No pneumothorax. No acute osseous abnormality. Degenerative changes of the shoulders. IMPRESSION: No active disease. Electronically Signed   By: Tish Frederickson M.D.   On: 03/04/2023 21:51   DG  Foot Complete Left Result Date: 03/04/2023 CLINICAL DATA:  Shortness of breath, left foot pain. EXAM: LEFT FOOT - COMPLETE 3+ VIEW COMPARISON:  None Available. FINDINGS: There is no evidence of fracture or dislocation. Joint spaces are maintained. There is osteophyte formation over the dorsal  midfoot compatible with mild degenerative change. There is soft tissue swelling over the dorsum of the foot. IMPRESSION: 1. No acute fracture or dislocation. 2. Soft tissue swelling over the dorsum of the foot. Electronically Signed   By: Darliss Cheney M.D.   On: 03/04/2023 21:43    Procedures Procedures    Medications Ordered in ED Medications  acetaminophen (TYLENOL) tablet 650 mg (650 mg Oral Given 03/04/23 2104)  predniSONE (DELTASONE) tablet 60 mg (60 mg Oral Given 03/04/23 2104)  ipratropium-albuterol (DUONEB) 0.5-2.5 (3) MG/3ML nebulizer solution 3 mL (3 mLs Nebulization Given 03/04/23 2144)  albuterol (PROVENTIL) (2.5 MG/3ML) 0.083% nebulizer solution 2.5 mg (2.5 mg Nebulization Given 03/04/23 2211)    ED Course/ Medical Decision Making/ A&P Clinical Course as of 03/04/23 2223  Sun Mar 04, 2023  2200 Patient is flu positive. Mild hypokalemia on labs, will be repleted. [VK]  2200 No acute disease on CXR, no bony injury on foot XR. [VK]  2221 On reassessment, patient reports some improvement of her symptoms.  She is still tachypneic with expiratory wheeze.  She will be given additional albuterol neb was recommended admission for further management of her COPD exacerbation in the setting of the flu. [VK]    Clinical Course User Index [VK] Rexford Maus, DO                                 Medical Decision Making This patient presents to the ED with chief complaint(s) of cough, SOB with pertinent past medical history of obesity, COPD, CHF, HTN, fibromyalgia which further complicates the presenting complaint. The complaint involves an extensive differential diagnosis and also carries with it a high risk of complications and morbidity.    The differential diagnosis includes viral syndrome, pneumonia, pneumothorax, pulmonary edema, pleural effusion, ACS, arrhythmia, anemia, COPD exacerbation  Additional history obtained: Additional history obtained from EMS  Records reviewed  previous admission documents  ED Course and Reassessment: On patient's arrival she was febrile and tachypneic though satting well on room air and in no respiratory distress.  She did have significant inspiratory and expiratory wheeze on exam consistent with a COPD exacerbation.  Patient will be given additional DuoNebs and steroids as well as Tylenol for her fever.  Patient will have EKG, labs including viral swab and chest x-ray performed.  Additional x-ray of her left foot to evaluate for any bony abnormality causing her pain and she will be closely reassessed.  Independent labs interpretation:  The following labs were independently interpreted: flu A positive, mild hypokalemia  Independent visualization of imaging: - I independently visualized the following imaging with scope of interpretation limited to determining acute life threatening conditions related to emergency care: CXR, L foot XR, which revealed no acute disease  Consultation: - Consulted or discussed management/test interpretation w/ external professional: hospitalist  Consideration for admission or further workup: patient requires admission for COPD exacerbation in the setting of Flu Social Determinants of health: N/A    Amount and/or Complexity of Data Reviewed Labs: ordered. Radiology: ordered.  Risk OTC drugs. Prescription drug management. Decision regarding hospitalization.          Final Clinical Impression(s) /  ED Diagnoses Final diagnoses:  Influenza A  COPD exacerbation [facility])    Rx / DC Orders ED Discharge Orders     None         Rexford Maus, DO 03/04/23 2223

## 2023-03-04 NOTE — H&P (Signed)
History and Physical    Patient: Jenna Chandler:[id number] DOB: May 06, 1965 DOA: 03/04/2023 DOS: the patient was seen and examined on 03/04/2023 PCP: Fleet Contras, MD  Patient coming from: {Point_of_Origin:[address]}  Chief Complaint:  Chief Complaint  Patient presents with  . Shortness of Breath  . Fever   HPI: Jenna Chandler is a 58 y.o. female with medical history significant of ***  Review of Systems: {ROS_Text:26778} Past Medical History:  Diagnosis Date  . Anxiety   . Asthma   . Chronic diastolic CHF (congestive heart failure) ([name])   . Chronic kidney disease   . COPD (chronic obstructive pulmonary disease) ([name])    +/- asthma   . Depression   . Diabetes mellitus without complication ([name])   . Dyspnea   . Enlarged heart   . Fibromyalgia   . Hypertension   . Manic, depressive ([name])   . Morbid obesity ([name])   . OSA (obstructive sleep apnea)    Past Surgical History:  Procedure Laterality Date  . NO PAST SURGERIES     Social History:  reports that she quit smoking about 20 years ago. Her smoking use included cigarettes. She started smoking about 35 years ago. She has a 30 pack-year smoking history. She has never used smokeless tobacco. She reports current drug use. She reports that she does not drink alcohol.  Allergies  Allergen Reactions  . Lisinopril Cough  . Levaquin [Levofloxacin]     Nauseated     Family History  Problem Relation Age of Onset  . Diabetes Father   . Cancer Mother   . Diabetes Mother     Prior to Admission medications   Medication Sig Start Date End Date Taking? Authorizing Provider  albuterol (PROVENTIL HFA;VENTOLIN HFA) 108 (90 BASE) MCG/ACT inhaler Inhale 2 puffs into the lungs every 6 (six) hours as needed. wheezing 12/29/13   Storm Frisk, MD  albuterol (PROVENTIL) (2.5 MG/3ML) 0.083% nebulizer solution Take 3 mLs (2.5 mg total) by nebulization every 6 (six) hours as needed for wheezing or shortness of breath. Patient not  taking: Reported on 02/25/2021 01/27/19   Coral Ceo, NP  albuterol (PROVENTIL) (2.5 MG/3ML) 0.083% nebulizer solution Take 3 mLs (2.5 mg total) by nebulization every 6 (six) hours as needed for wheezing or shortness of breath. 02/25/21   Lorre Nick, MD  ALPRAZolam Prudy Feeler) 1 MG tablet Take 1 mg by mouth 2 (two) times daily as needed for anxiety.  08/29/18   [provider]  ALPRAZolam Prudy Feeler) 1 MG tablet Take 1 tablet (1 mg total) by mouth daily as needed. 01/03/22     ALPRAZolam (XANAX) 1 MG tablet Take 1 tablet (1 mg total) by mouth daily as needed. 04/28/22     alprazolam (XANAX) 2 MG tablet Take 1 tablet (2 mg total) by mouth daily as needed. 05/31/22     amLODipine (NORVASC) 10 MG tablet TAKE 1 TABLET(10 MG) BY MOUTH TWICE DAILY Patient taking differently: Take 10 mg by mouth 2 (two) times daily. 12/26/17   Quintella Reichert, MD  aspirin 325 MG tablet Take 325 mg by mouth daily. 09/09/20   [provider]  azithromycin (ZITHROMAX) 250 MG tablet Take two today and then one daily until finished. Patient not taking: Reported on 02/25/2021 01/13/21   Chilton Greathouse, MD  Brimonidine Tartrate (LUMIFY) 0.025 % SOLN Place 2 drops into both eyes daily as needed (redness).    [provider]  cloNIDine (CATAPRES) 0.2 MG tablet Take 1 tablet (  0.2 mg total) by mouth 2 (two) times daily. 09/10/17   Quintella Reichert, MD  cyclobenzaprine (FLEXERIL) 10 MG tablet Take 1 tablet (10 mg total) by mouth 2 (two) times daily as needed for muscle spasms. 08/26/13   Kirichenko, Tatyana, PA-C  Dextran 70-Hypromellose (ARTIFICIAL TEARS) 0.1-0.3 % SOLN Place 2 drops into both eyes daily as needed (dry eyes).    [provider]  doxycycline (VIBRA-TABS) 100 MG tablet Take 1 tablet (100 mg total) by mouth 2 (two) times daily. Patient not taking: Reported on 02/25/2021 03/18/20   Kalman Shan, MD  fluconazole (DIFLUCAN) 100 MG tablet Take 1 and 1/2 tablets now.  Should symptoms persist after  72 hours, may take remaining 1 and 1/2 tablets. 01/03/22     furosemide (LASIX) 40 MG tablet Take 80 mg by mouth 2 (two) times daily. 05/18/11   Black, Lesle Chris, NP  gabapentin (NEURONTIN) 300 MG capsule Take 300 mg by mouth 2 (two) times daily. 800mg  Patient not taking: Reported on 02/25/2021 09/12/16   [provider]  gabapentin (NEURONTIN) 800 MG tablet Take 800 mg by mouth daily as needed (pain). 06/26/19   [provider]  gabapentin (NEURONTIN) 800 MG tablet Take 1 tablet (800 mg total) by mouth daily. 04/28/22     HYDROcodone-acetaminophen (NORCO) 10-325 MG tablet Take 1 tablet by mouth every 8 (eight) hours as needed for severe pain. 01/17/21   [provider]  HYDROcodone-acetaminophen (NORCO) 10-325 MG tablet Take 1 tablet by mouth every 6 (six) hours as needed for pain 01/03/22     HYDROcodone-acetaminophen (NORCO) 10-325 MG tablet Take 1 tablet by mouth 3 (three) times daily. 06/30/22     losartan (COZAAR) 100 MG tablet Take 100 mg by mouth daily. 09/26/19   [provider]  MUCUS RELIEF 600 MG 12 hr tablet Take 600 mg by mouth 2 (two) times daily as needed for cough. 02/14/21   [provider]  naloxone [facility]) nasal spray 4 mg/0.1 mL Use 1 (one) Spray as directed as needed. 04/21/22     naloxone (NARCAN) nasal spray 4 mg/0.1 mL Place 1 spray (4 mg total) into the nose as needed. 05/31/22     naloxone (NARCAN) nasal spray 4 mg/0.1 mL Place 1 spray into the nose as needed. 06/30/22     nystatin (MYCOSTATIN/NYSTOP) powder Apply to affected area 3 times per day. 01/03/22     phentermine 30 MG capsule Take 1 capsule (30 mg total) by mouth daily. 05/31/22     Potassium Chloride ER 20 MEQ TBCR Take 20 mEq by mouth daily as needed (potassium deficiency). 09/29/19   [provider]  predniSONE (DELTASONE) 20 MG tablet Take 2 tablets (40 mg total) by mouth daily with breakfast. Patient not taking: Reported on 02/25/2021 01/13/21   Chilton Greathouse, MD   predniSONE (STERAPRED UNI-PAK 21 TAB) 10 MG (21) TBPK tablet Take by mouth daily. Take 6 tabs by mouth daily  for 2 days, then 5 tabs for 2 days, then 4 tabs for 2 days, then 3 tabs for 2 days, 2 tabs for 2 days, then 1 tab by mouth daily for 2 days 02/25/21   Lorre Nick, MD  sodium chloride (OCEAN) 0.65 % SOLN nasal spray Place 1 spray into both nostrils daily.    [provider]  traZODone (DESYREL) 100 MG tablet TAKE 1 TABLET(100 MG) BY MOUTH AT BEDTIME AS NEEDED FOR SLEEP Patient taking differently: Take 100 mg by mouth at bedtime as needed for  sleep. 11/04/18   Glenford Bayley, NP  Vitamin D, Ergocalciferol, (DRISDOL) 1.25 MG (50000 UNIT) CAPS capsule Take 50,000 Units by mouth once a week. Mondays 12/09/20   [provider]    Physical Exam: Vitals:   03/04/23 2304 03/04/23 2315 03/04/23 2323 03/04/23 2325  BP:  (!) 146/81    Pulse: 87 92 86 84  Resp: 15 18 (!) 21 (!) 22  Temp:      TempSrc:      SpO2: 92% 92% 94% 92%  Weight:      Height:       *** Data Reviewed: {Tip this will not be part of the note when signed- Document your independent interpretation of telemetry tracing, EKG, lab, Radiology test or any other diagnostic tests. Add any new diagnostic test ordered today. (Optional):26781} {Results:26384}  Assessment and Plan: No notes have been filed under this hospital service. Service: Hospitalist     Advance Care Planning:   Code Status: Full Code ***  Consults: ***  Family Communication: ***  Severity of Illness: {Observation/Inpatient:21159}  AuthorLonia Blood, MD 03/04/2023 11:30 PM  For on call review [email or web address].

## 2023-03-05 DIAGNOSIS — J101 Influenza due to other identified influenza virus with other respiratory manifestations: Principal | ICD-10-CM | POA: Insufficient documentation

## 2023-03-05 LAB — CREATININE, SERUM
Creatinine, Ser: 1.02 mg/dL — ABNORMAL HIGH (ref 0.44–1.00)
GFR, Estimated: >60 mL/min (ref >60)

## 2023-03-05 LAB — COMPREHENSIVE METABOLIC PANEL
ALT: 16 U/L (ref 0–44)
AST: 44 U/L — ABNORMAL HIGH (ref 15–41)
Albumin: 3.2 g/dL — ABNORMAL LOW (ref 3.5–5.0)
Alkaline Phosphatase: 49 U/L (ref 38–126)
Anion gap: 13 (ref 5–15)
BUN: 11 mg/dL (ref 6–20)
CO2: 26 mmol/L (ref 22–32)
Calcium: 8.3 mg/dL — ABNORMAL LOW (ref 8.9–10.3)
Chloride: 94 mmol/L — ABNORMAL LOW (ref 98–111)
Creatinine, Ser: 1.01 mg/dL — ABNORMAL HIGH (ref 0.44–1.00)
GFR, Estimated: >60 mL/min (ref >60)
Glucose, Bld: 122 mg/dL — ABNORMAL HIGH (ref 70–99)
Potassium: 3.1 mmol/L — ABNORMAL LOW (ref 3.5–5.1)
Sodium: 133 mmol/L — ABNORMAL LOW (ref 135–145)
Total Bilirubin: 0.9 mg/dL (ref 0.0–1.2)
Total Protein: 6.8 g/dL (ref 6.5–8.1)

## 2023-03-05 LAB — CBC
HCT: 44.9 % (ref 36.0–46.0)
Hemoglobin: 14.3 g/dL (ref 12.0–15.0)
MCH: 29.1 pg (ref 26.0–34.0)
MCHC: 31.8 g/dL (ref 30.0–36.0)
MCV: 91.3 fL (ref 80.0–100.0)
Platelets: 163 10*3/uL (ref 150–400)
RBC: 4.92 MIL/uL (ref 3.87–5.11)
RDW: 13.4 % (ref 11.5–15.5)
WBC: 3.7 10*3/uL — ABNORMAL LOW (ref 4.0–10.5)
nRBC: 0 % (ref 0.0–0.2)

## 2023-03-05 LAB — URINALYSIS, ROUTINE W REFLEX MICROSCOPIC
Bacteria, UA: NONE SEEN
Bilirubin Urine: NEGATIVE
Glucose, UA: NEGATIVE mg/dL
Ketones, ur: NEGATIVE mg/dL
Leukocytes,Ua: NEGATIVE
Nitrite: NEGATIVE
Protein, ur: 100 mg/dL — AB
Specific Gravity, Urine: 1.013 (ref 1.005–1.030)
pH: 7 (ref 5.0–8.0)

## 2023-03-05 LAB — HIV ANTIBODY (ROUTINE TESTING W REFLEX): HIV Screen 4th Generation wRfx: NONREACTIVE

## 2023-03-05 LAB — MAGNESIUM: Magnesium: 2 mg/dL (ref 1.7–2.4)

## 2023-03-05 MED ORDER — ACETAMINOPHEN 325 MG PO TABS
650.0000 mg | ORAL_TABLET | Freq: Four times a day (QID) | ORAL | Status: DC | PRN
  Administered 2023-03-07: 650 mg via ORAL
  Filled 2023-03-05: qty 2

## 2023-03-05 MED ORDER — IPRATROPIUM-ALBUTEROL 0.5-2.5 (3) MG/3ML IN SOLN
3.0000 mL | Freq: Four times a day (QID) | RESPIRATORY_TRACT | Status: DC
  Administered 2023-03-05 – 2023-03-08 (×13): 3 mL via RESPIRATORY_TRACT
  Filled 2023-03-05 (×13): qty 3

## 2023-03-05 MED ORDER — POTASSIUM CHLORIDE CRYS ER 20 MEQ PO TBCR
60.0000 meq | EXTENDED_RELEASE_TABLET | Freq: Once | ORAL | Status: AC
  Administered 2023-03-05: 60 meq via ORAL
  Filled 2023-03-05: qty 3

## 2023-03-05 MED ORDER — CLONIDINE HCL 0.1 MG PO TABS
0.2000 mg | ORAL_TABLET | Freq: Two times a day (BID) | ORAL | Status: DC
  Administered 2023-03-05 – 2023-03-09 (×10): 0.2 mg via ORAL
  Filled 2023-03-05 (×9): qty 2
  Filled 2023-03-05: qty 1

## 2023-03-05 MED ORDER — PREDNISONE 20 MG PO TABS
40.0000 mg | ORAL_TABLET | Freq: Every day | ORAL | Status: DC
  Administered 2023-03-05 – 2023-03-09 (×5): 40 mg via ORAL
  Filled 2023-03-05 (×5): qty 2

## 2023-03-05 MED ORDER — AMLODIPINE BESYLATE 10 MG PO TABS
10.0000 mg | ORAL_TABLET | Freq: Every day | ORAL | Status: DC
  Administered 2023-03-05 – 2023-03-09 (×5): 10 mg via ORAL
  Filled 2023-03-05: qty 2
  Filled 2023-03-05 (×4): qty 1

## 2023-03-05 MED ORDER — ALBUTEROL SULFATE (2.5 MG/3ML) 0.083% IN NEBU: 2.5000 mg | INHALATION_SOLUTION | RESPIRATORY_TRACT | Status: DC | PRN

## 2023-03-05 NOTE — Progress Notes (Signed)
Placed patient on CPAP for the night via auto-mode with oxygen set at 2lpm

## 2023-03-05 NOTE — Plan of Care (Signed)
Problem: Education: Goal: Knowledge of General Education information will improve Description: Including pain rating scale, medication(s)/side effects and non-pharmacologic comfort measures Outcome: Progressing   Problem: Health Behavior/Discharge Planning: Goal: Ability to manage health-related needs will improve Outcome: Progressing   Problem: Nutrition: Goal: Adequate nutrition will be maintained Outcome: Progressing   Problem: Coping: Goal: Level of anxiety will decrease Outcome: Progressing   Problem: Elimination: Goal: Will not experience complications related to bowel motility Outcome: Progressing Goal: Will not experience complications related to urinary retention Outcome: Progressing

## 2023-03-05 NOTE — ED Notes (Signed)
ED TO INPATIENT HANDOFF REPORT  ED Nurse Name and Phone #: Rulon Eisenmenger Name/Age/Gender Jenna Chandler 58 y.o. female Room/Bed: 044C/044C  Code Status   Code Status: Full Code  Home/SNF/Other Home Patient oriented to: self, place, time, and situation Is this baseline? Yes   Triage Complete: Triage complete  Chief Complaint Acute on chronic respiratory failure [facility]) [J96.20]  Triage Note Pt arrives via EMS from home with fever since Friday, Saturday, last night became more SOB. Today with increased SOB, wheezing. Pt states was using home nebulizer with little relief. Pt awake, alert, appropriate.   Allergies Allergies  Allergen Reactions   Lisinopril Cough   Levaquin [Levofloxacin]     Nauseated     Level of Care/Admitting Diagnosis ED Disposition     ED Disposition  Admit   Condition  --   Comment  Hospital Area: [facility] [[id number]]  Level of Care: Telemetry Medical [[id number]]  May admit patient to [facility] or [facility] if equivalent level of care is available:: Yes  Covid Evaluation: Confirmed COVID Negative  Diagnosis: Acute on chronic respiratory failure [facility]) [[id number]]  Admitting Physician: Rometta Emery [[id number]]  Attending Physician: Rometta Emery [[id number]]  Certification:: I certify this patient will need inpatient services for at least 2 midnights  Expected Medical Readiness: 03/06/2023          B Medical/Surgery History Past Medical History:  Diagnosis Date   Anxiety    Asthma    Chronic diastolic CHF (congestive heart failure) ([name])    Chronic kidney disease    COPD (chronic obstructive pulmonary disease) ([name])    +/- asthma    Depression    Diabetes mellitus without complication ([name])    Dyspnea    Enlarged heart    Fibromyalgia    Hypertension    Manic, depressive ([name])    Morbid obesity ([name])    OSA (obstructive sleep apnea)    Past Surgical History:  Procedure Laterality Date   NO PAST SURGERIES        A IV Location/Drains/Wounds Patient Lines/Drains/Airways Status     Active Line/Drains/Airways     Name Placement date Placement time Site Days   Peripheral IV 03/04/23 20 G Anterior;Left;Proximal Antecubital 03/04/23  2033  Antecubital  1            Intake/Output Last 24 hours No intake or output data in the 24 hours ending 03/05/23 1359  Labs/Imaging Results for orders placed or performed during the hospital encounter of 03/04/23 (from the past 48 hours)  Basic metabolic panel     Status: Abnormal   Collection Time: 03/04/23  8:26 PM  Result Value Ref Range   Sodium 139 135 - 145 mmol/L   Potassium 3.2 (L) 3.5 - 5.1 mmol/L   Chloride 96 (L) 98 - 111 mmol/L   CO2 29 22 - 32 mmol/L   Glucose, Bld 103 (H) 70 - 99 mg/dL    Comment: Glucose reference range applies only to samples taken after fasting for at least 8 hours.   BUN 7 6 - 20 mg/dL   Creatinine, Ser 8.46 (H) 0.44 - 1.00 mg/dL   Calcium 8.6 (L) 8.9 - 10.3 mg/dL   GFR, Estimated 59 (L) >60 mL/min    Comment: (NOTE) Calculated using the CKD-EPI Creatinine Equation (2021)    Anion gap 14 5 - 15    Comment: Performed at [facility], [address] [place], [place] [address]  CBC with Differential     Status: Abnormal   Collection Time: 03/04/23  8:26 PM  Result Value Ref Range   WBC 3.8 (L) 4.0 - 10.5 K/uL   RBC 4.98 3.87 - 5.11 MIL/uL   Hemoglobin 14.5 12.0 - 15.0 g/dL   HCT 02.7 25.3 - 66.4 %   MCV 91.4 80.0 - 100.0 fL   MCH 29.1 26.0 - 34.0 pg   MCHC 31.9 30.0 - 36.0 g/dL   RDW 40.3 47.4 - 25.9 %   Platelets 186 150 - 400 K/uL   nRBC 0.0 0.0 - 0.2 %   Neutrophils Relative % 58 %   Neutro Abs 2.2 1.7 - 7.7 K/uL   Lymphocytes Relative 20 %   Lymphs Abs 0.8 0.7 - 4.0 K/uL   Monocytes Relative 21 %   Monocytes Absolute 0.8 0.1 - 1.0 K/uL   Eosinophils Relative 0 %   Eosinophils Absolute 0.0 0.0 - 0.5 K/uL   Basophils Relative 1 %   Basophils Absolute 0.0 0.0 - 0.1 K/uL   Immature Granulocytes  0 %   Abs Immature Granulocytes 0.01 0.00 - 0.07 K/uL    Comment: Performed at [facility], [address] [place], [place] [address]  Resp panel by RT-PCR (RSV, Flu A&B, Covid) Anterior Nasal Swab     Status: Abnormal   Collection Time: 03/04/23  8:45 PM   Specimen: Anterior Nasal Swab  Result Value Ref Range   SARS Coronavirus 2 by RT PCR NEGATIVE NEGATIVE   Influenza A by PCR POSITIVE (A) NEGATIVE   Influenza B by PCR NEGATIVE NEGATIVE    Comment: (NOTE) The Xpert Xpress SARS-CoV-2/FLU/RSV plus assay is intended as an aid in the diagnosis of influenza from Nasopharyngeal swab specimens and should not be used as a sole basis for treatment. Nasal washings and aspirates are unacceptable for Xpert Xpress SARS-CoV-2/FLU/RSV testing.  Fact Sheet for Patients: BloggerCourse.com  Fact Sheet for Healthcare Providers: SeriousBroker.it  This test is not yet approved or cleared by the [place] FDA and has been authorized for detection and/or diagnosis of SARS-CoV-2 by FDA under an Emergency Use Authorization (EUA). This EUA will remain in effect (meaning this test can be used) for the duration of the COVID-19 declaration under Section 564(b)(1) of the Act, 21 U.S.C. section 360bbb-3(b)(1), unless the authorization is terminated or revoked.     Resp Syncytial Virus by PCR NEGATIVE NEGATIVE    Comment: (NOTE) Fact Sheet for Patients: BloggerCourse.com  Fact Sheet for Healthcare Providers: SeriousBroker.it  This test is not yet approved or cleared by the [place] FDA and has been authorized for detection and/or diagnosis of SARS-CoV-2 by FDA under an Emergency Use Authorization (EUA). This EUA will remain in effect (meaning this test can be used) for the duration of the COVID-19 declaration under Section 564(b)(1) of the Act, 21 U.S.C. section 360bbb-3(b)(1), unless the  authorization is terminated or revoked.  Performed at [facility], [address] [place], [place] [address]   Urinalysis, Routine w reflex microscopic -Urine, Clean Catch     Status: Abnormal   Collection Time: 03/05/23  5:41 AM  Result Value Ref Range   Color, Urine YELLOW YELLOW   APPearance CLEAR CLEAR   Specific Gravity, Urine 1.013 1.005 - 1.030   pH 7.0 5.0 - 8.0   Glucose, UA NEGATIVE NEGATIVE mg/dL   Hgb urine dipstick SMALL (A) NEGATIVE   Bilirubin Urine NEGATIVE NEGATIVE   Ketones, ur NEGATIVE NEGATIVE mg/dL  Protein, ur 100 (A) NEGATIVE mg/dL   Nitrite NEGATIVE NEGATIVE   Leukocytes,Ua NEGATIVE NEGATIVE   RBC / HPF 0-5 0 - 5 RBC/hpf   WBC, UA 0-5 0 - 5 WBC/hpf   Bacteria, UA NONE SEEN NONE SEEN   Squamous Epithelial / HPF 0-5 0 - 5 /HPF    Comment: Performed at [facility], [address], [place], [place] [address]  Magnesium     Status: None   Collection Time: 03/05/23  5:54 AM  Result Value Ref Range   Magnesium 2.0 1.7 - 2.4 mg/dL    Comment: Performed at [facility], [address], [place], [place] [address]  HIV Antibody (routine testing w rflx)     Status: None   Collection Time: 03/05/23  5:55 AM  Result Value Ref Range   HIV Screen 4th Generation wRfx Non Reactive Non Reactive    Comment: Performed at [facility], [address], [place], [place] [address]  Creatinine, serum     Status: Abnormal   Collection Time: 03/05/23  5:55 AM  Result Value Ref Range   Creatinine, Ser 1.02 (H) 0.44 - 1.00 mg/dL   GFR, Estimated >64 >40 mL/min    Comment: (NOTE) Calculated using the CKD-EPI Creatinine Equation (2021) Performed at [facility], [address], [place], [place] [address]   CBC     Status: Abnormal   Collection Time: 03/05/23  5:55 AM  Result Value Ref Range   WBC 3.7 (L) 4.0 - 10.5 K/uL   RBC 4.92 3.87 - 5.11 MIL/uL   Hemoglobin 14.3 12.0 - 15.0 g/dL   HCT 59.5 63.8 - 75.6 %   MCV 91.3 80.0 - 100.0 fL    MCH 29.1 26.0 - 34.0 pg   MCHC 31.8 30.0 - 36.0 g/dL   RDW 43.3 29.5 - 18.8 %   Platelets 163 150 - 400 K/uL   nRBC 0.0 0.0 - 0.2 %    Comment: Performed at [facility], [address] [place], [place] [address]  Comprehensive metabolic panel     Status: Abnormal   Collection Time: 03/05/23  5:55 AM  Result Value Ref Range   Sodium 133 (L) 135 - 145 mmol/L   Potassium 3.1 (L) 3.5 - 5.1 mmol/L   Chloride 94 (L) 98 - 111 mmol/L   CO2 26 22 - 32 mmol/L   Glucose, Bld 122 (H) 70 - 99 mg/dL    Comment: Glucose reference range applies only to samples taken after fasting for at least 8 hours.   BUN 11 6 - 20 mg/dL   Creatinine, Ser 6.30 (H) 0.44 - 1.00 mg/dL   Calcium 8.3 (L) 8.9 - 10.3 mg/dL   Total Protein 6.8 6.5 - 8.1 g/dL   Albumin 3.2 (L) 3.5 - 5.0 g/dL   AST 44 (H) 15 - 41 U/L   ALT 16 0 - 44 U/L   Alkaline Phosphatase 49 38 - 126 U/L   Total Bilirubin 0.9 0.0 - 1.2 mg/dL   GFR, Estimated >16 >01 mL/min    Comment: (NOTE) Calculated using the CKD-EPI Creatinine Equation (2021)    Anion gap 13 5 - 15    Comment: Performed at [facility], [address] [place], [place] [address]   DG Chest Port 1 View Result Date: 03/04/2023 CLINICAL DATA:  SOB EXAM: PORTABLE CHEST 1 VIEW COMPARISON:  Chest x-ray 08/22/2021 FINDINGS: Limited evaluation due to overlapping osseous structures  and overlying soft tissues. The heart and mediastinal contours are unchanged. No focal consolidation. No pulmonary edema. No pleural effusion. No pneumothorax. No acute osseous abnormality. Degenerative changes of the shoulders. IMPRESSION: No active disease. Electronically Signed   By: Tish Frederickson M.D.   On: 03/04/2023 21:51   DG Foot Complete Left Result Date: 03/04/2023 CLINICAL DATA:  Shortness of breath, left foot pain. EXAM: LEFT FOOT - COMPLETE 3+ VIEW COMPARISON:  None Available. FINDINGS: There is no evidence of fracture or dislocation. Joint spaces are maintained. There is osteophyte  formation over the dorsal midfoot compatible with mild degenerative change. There is soft tissue swelling over the dorsum of the foot. IMPRESSION: 1. No acute fracture or dislocation. 2. Soft tissue swelling over the dorsum of the foot. Electronically Signed   By: Darliss Cheney M.D.   On: 03/04/2023 21:43    Pending Labs Unresulted Labs (From admission, onward)     Start     Ordered   03/11/23 0500  Creatinine, serum  (enoxaparin (LOVENOX)    CrCl >/= 30 ml/min)  Weekly,   R     Comments: while on enoxaparin therapy    03/04/23 2353            Vitals/Pain Today's Vitals   03/05/23 1115 03/05/23 1200 03/05/23 1215 03/05/23 1301  BP:  (!) 129/97 (!) 110/91   Pulse: 80 (!) 51 74   Resp: (!) 26 (!) 22 (!) 22   Temp:    98.7 F (37.1 C)  TempSrc:    Oral  SpO2: 92% 97% 96%   Weight:      Height:      PainSc:        Isolation Precautions No active isolations  Medications Medications  ALPRAZolam (XANAX) tablet 2 mg (has no administration in time range)  enoxaparin (LOVENOX) injection 90 mg (90 mg Subcutaneous Given 03/05/23 1016)  lactated ringers infusion ( Intravenous New Bag/Given 03/05/23 0232)  metoprolol tartrate (LOPRESSOR) injection 5 mg (has no administration in time range)  oseltamivir (TAMIFLU) capsule 75 mg (75 mg Oral Given 03/05/23 1024)  doxycycline (VIBRAMYCIN) 100 mg in sodium chloride 0.9 % 250 mL IVPB (100 mg Intravenous New Bag/Given 03/05/23 1300)  acetaminophen (TYLENOL) tablet 650 mg (has no administration in time range)  ipratropium-albuterol (DUONEB) 0.5-2.5 (3) MG/3ML nebulizer solution 3 mL (3 mLs Nebulization Given 03/05/23 1016)  albuterol (PROVENTIL) (2.5 MG/3ML) 0.083% nebulizer solution 2.5 mg (has no administration in time range)  predniSONE (DELTASONE) tablet 40 mg (40 mg Oral Given 03/05/23 1015)  amLODipine (NORVASC) tablet 10 mg (10 mg Oral Given 03/05/23 1015)  cloNIDine (CATAPRES) tablet 0.2 mg (0.2 mg Oral Given 03/05/23 1019)  acetaminophen  (TYLENOL) tablet 650 mg (650 mg Oral Given 03/04/23 2104)  predniSONE (DELTASONE) tablet 60 mg (60 mg Oral Given 03/04/23 2104)  ipratropium-albuterol (DUONEB) 0.5-2.5 (3) MG/3ML nebulizer solution 3 mL (3 mLs Nebulization Given 03/04/23 2144)  albuterol (PROVENTIL) (2.5 MG/3ML) 0.083% nebulizer solution 2.5 mg (2.5 mg Nebulization Given 03/04/23 2211)  potassium chloride SA (KLOR-CON M) CR tablet 60 mEq (60 mEq Oral Given 03/05/23 1016)    Mobility walks     Focused Assessments Pulmonary Assessment Handoff:  Lung sounds: Bilateral Breath Sounds: Expiratory wheezes O2 Device: Nasal Cannula O2 Flow Rate (L/min): 2 L/min    R Recommendations: See Admitting Provider Note  Report given to:   Additional Notes: Pt is AO, walky, talky, on a hospital bed, purewick in place

## 2023-03-05 NOTE — ED Notes (Signed)
Patient leaving the floor in stable condition, AOX4, with her belongings and saff.

## 2023-03-05 NOTE — Progress Notes (Signed)
PROGRESS NOTE    KINLIE JANICE  ZOX:[id number] DOB: 04-16-1965 DOA: 03/04/2023 PCP: Fleet Contras, MD     Brief Narrative:   From admission h and p Jenna Chandler is a 58 y.o. female with medical history significant of morbid obesity, obstructive sleep apnea, depression chronic diastolic heart failure, chronic kidney disease stage III, COPD, diabetes, fibromyalgia who presents to the ER with shortness of breath.  Patient also has fever since Friday.  Patient also has wheezing.  She has tried her home nebulizer with little relief.  Patient came to the ER where she was initially hypoxic.  Patient also has increasing pain in her left foot of toe with appeared to be a fall.  At this point she is positive for only influenza A like the rest of her family.  Several of them have come down with the influenza A infection.    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  Continue supplemental o2 and treatments as below   #2 influenza A infection: Continue Tamiflu.  Continue fluids and supportive care.   #3 morbid obesity: Dietary counseling.   #4 COPD with acute exacerbation: Continue with steroids, and abx. Will order breathing treatments   #5 obstructive sleep apnea: Continue with CPAP.  Continue monitoring.   #7 AKI: Probably due to flu.  Most likely prerenal.  Continue to monitor renal function. Improving with fluids   #8 hypokalemia: Continue to replete potassium. F/u Mg.   #9 restrictive lung disease: Secondary to obstructive obesity hypoventilation  syndrome.  Continue to monitor. On cpap at night   #10 diastolic heart failure: Chronic.  Appears to be compensated.  Continue to monitor  #11 hypertension: resume home amlodipine, clonidine. Will hold losart/hydrochlorothiazide 2/2 aki  #12 GAD: home xanax  #13 debility: pt consult    DVT prophylaxis: lovenox Code Status: full Family Communication: none at bedside. No answer when either daughter called today  Level of care: Telemetry Medical Status is: Inpatient Remains inpatient appropriate because: severity of illness    Consultants:  none  Procedures: none  Antimicrobials:  doxy    Subjective: Reports ongoing body aches, cough, sob. Mild improvement from yesterday. Tolerating PO  Objective: Vitals:   03/05/23 0530 03/05/23 0548 03/05/23 0730 03/05/23 0800  BP: 111/84  (!) 158/93   Pulse: 73  78   Resp: 20  (!) 21   Temp:  98.8 F (37.1 C)  99.1 F (37.3 C)  TempSrc:  Oral  Oral  SpO2: 94%  93%   Weight:      Height:       No intake or output data in the 24 hours ending 03/05/23 0840 Filed Weights   03/04/23 2028  Weight: (!) 195 kg    Examination:  General exam: Appears calm and comfortable  Respiratory system: exp wheeze throughout Cardiovascular system: S1 & S2 heard, RRR.  Gastrointestinal system: Abdomen is obese, soft and nontender.   Central nervous system: Alert and oriented. No focal neurological deficits. Extremities: Symmetric 5 x 5 power.  Pitting edema lower legs b/l Skin: No rashes, lesions or ulcers Psychiatry: Judgement and insight appear normal. Mood & affect appropriate.     Data Reviewed: I have personally reviewed following labs and imaging studies  CBC: Recent Labs  Lab 03/04/23 2026 03/05/23 0555  WBC 3.8* 3.7*  NEUTROABS 2.2  --   HGB 14.5 14.3  HCT 45.5 44.9  MCV 91.4 91.3  PLT 186 163   Basic Metabolic Panel: Recent Labs  Lab 03/04/23 2026 03/05/23 0555  NA 139 133*  K 3.2* 3.1*  CL 96* 94*  CO2 29 26   GLUCOSE 103* 122*  BUN 7 11  CREATININE 1.09* 1.01*  CALCIUM 8.6* 8.3*   GFR: Estimated Creatinine Clearance: 104.9 mL/min (A) (by C-G formula based on SCr of 1.01 mg/dL (H)). Liver Function Tests: Recent Labs  Lab 03/05/23 0555  AST 44*  ALT 16  ALKPHOS 49  BILITOT 0.9  PROT 6.8  ALBUMIN 3.2*   No results for input(s): "LIPASE", "AMYLASE" in the last 168 hours. No results for input(s): "AMMONIA" in the last 168 hours. Coagulation Profile: No results for input(s): "INR", "PROTIME" in the last 168 hours. Cardiac Enzymes: No results for input(s): "CKTOTAL", "CKMB", "CKMBINDEX", "TROPONINI" in the last 168 hours. BNP (last 3 results) No results for input(s): "PROBNP" in the last 8760 hours. HbA1C: No results for input(s): "HGBA1C" in the last 72 hours. CBG: No results for input(s): "GLUCAP" in the last 168 hours. Lipid Profile: No results for input(s): "CHOL", "HDL", "LDLCALC", "TRIG", "CHOLHDL", "LDLDIRECT" in the last 72 hours. Thyroid Function Tests: No results for input(s): "TSH", "T4TOTAL", "FREET4", "T3FREE", "THYROIDAB" in the last 72 hours. Anemia Panel: No results for input(s): "VITAMINB12", "FOLATE", "FERRITIN", "TIBC", "IRON", "RETICCTPCT" in the last 72 hours. Urine analysis:    Component Value Date/Time   COLORURINE YELLOW 03/05/2023 0541   APPEARANCEUR CLEAR 03/05/2023 0541   LABSPEC 1.013 03/05/2023 0541   PHURINE 7.0 03/05/2023 0541   GLUCOSEU NEGATIVE 03/05/2023 0541   HGBUR SMALL (A) 03/05/2023 0541   BILIRUBINUR NEGATIVE 03/05/2023 0541   KETONESUR NEGATIVE 03/05/2023 0541   PROTEINUR 100 (A) 03/05/2023 0541   UROBILINOGEN 0.2 03/19/2013 2014   NITRITE NEGATIVE 03/05/2023 0541   LEUKOCYTESUR NEGATIVE 03/05/2023 0541   Sepsis Labs: @LABRCNTIP (procalcitonin:4,lacticidven:4)  ) Recent Results (from the past 240 hours)  Resp panel by RT-PCR (RSV, Flu A&B, Covid) Anterior Nasal Swab     Status: Abnormal   Collection Time: 03/04/23  8:45 PM    Specimen: Anterior Nasal Swab  Result Value Ref Range Status   SARS Coronavirus 2 by RT PCR NEGATIVE NEGATIVE Final   Influenza A by PCR POSITIVE (A) NEGATIVE Final   Influenza B by PCR NEGATIVE NEGATIVE Final    Comment: (NOTE) The Xpert Xpress SARS-CoV-2/FLU/RSV plus assay is intended as an aid in the diagnosis of influenza from Nasopharyngeal swab specimens and should not be used as a sole basis for treatment. Nasal washings and aspirates are unacceptable for Xpert Xpress SARS-CoV-2/FLU/RSV testing.  Fact Sheet for Patients: BloggerCourse.com  Fact Sheet for Healthcare Providers: SeriousBroker.it  This test is not yet approved or cleared by the [place] FDA and has been authorized for detection and/or diagnosis of SARS-CoV-2 by FDA under an Emergency Use Authorization (EUA). This EUA will remain in effect (meaning this test can be used) for the duration of the COVID-19 declaration under Section 564(b)(1) of the Act, 21 U.S.C. section 360bbb-3(b)(1), unless the authorization is terminated or revoked.     Resp  Syncytial Virus by PCR NEGATIVE NEGATIVE Final    Comment: (NOTE) Fact Sheet for Patients: BloggerCourse.com  Fact Sheet for Healthcare Providers: SeriousBroker.it  This test is not yet approved or cleared by the [place] FDA and has been authorized for detection and/or diagnosis of SARS-CoV-2 by FDA under an Emergency Use Authorization (EUA). This EUA will remain in effect (meaning this test can be used) for the duration of the COVID-19 declaration under Section 564(b)(1) of the Act, 21 U.S.C. section 360bbb-3(b)(1), unless the authorization is terminated or revoked.  Performed at [facility], [address] [place], [place] [address]          Radiology Studies: DG Chest Port 1 View Result Date: 03/04/2023 CLINICAL DATA:  SOB EXAM: PORTABLE  CHEST 1 VIEW COMPARISON:  Chest x-ray 08/22/2021 FINDINGS: Limited evaluation due to overlapping osseous structures and overlying soft tissues. The heart and mediastinal contours are unchanged. No focal consolidation. No pulmonary edema. No pleural effusion. No pneumothorax. No acute osseous abnormality. Degenerative changes of the shoulders. IMPRESSION: No active disease. Electronically Signed   By: Tish Frederickson M.D.   On: 03/04/2023 21:51   DG Foot Complete Left Result Date: 03/04/2023 CLINICAL DATA:  Shortness of breath, left foot pain. EXAM: LEFT FOOT - COMPLETE 3+ VIEW COMPARISON:  None Available. FINDINGS: There is no evidence of fracture or dislocation. Joint spaces are maintained. There is osteophyte formation over the dorsal midfoot compatible with mild degenerative change. There is soft tissue swelling over the dorsum of the foot. IMPRESSION: 1. No acute fracture or dislocation. 2. Soft tissue swelling over the dorsum of the foot. Electronically Signed   By: Darliss Cheney M.D.   On: 03/04/2023 21:43        Scheduled Meds:  enoxaparin (LOVENOX) injection  90 mg Subcutaneous Q24H   methylPREDNISolone (SOLU-MEDROL) injection  125 mg Intravenous Q12H   oseltamivir  75 mg Oral BID   Continuous Infusions:  doxycycline (VIBRAMYCIN) IV Stopped (03/05/23 0703)   lactated ringers 40 mL/hr at 03/05/23 0232     LOS: 1 day     Silvano Bilis, MD [facility]   If 7PM-7AM, please contact night-coverage [email or web address] Password [email or web address] 03/05/2023, 8:40 AM

## 2023-03-05 NOTE — ED Notes (Signed)
Patient refused to let me start an IV, request IV team, said she was a hard stick, and that I could not stick her AC's, that the 2 minutes I spent looking for a vein was too long.

## 2023-03-06 DIAGNOSIS — J441 Chronic obstructive pulmonary disease with (acute) exacerbation: Secondary | ICD-10-CM | POA: Diagnosis not present

## 2023-03-06 DIAGNOSIS — J9601 Acute respiratory failure with hypoxia: Secondary | ICD-10-CM

## 2023-03-06 DIAGNOSIS — J101 Influenza due to other identified influenza virus with other respiratory manifestations: Secondary | ICD-10-CM | POA: Diagnosis not present

## 2023-03-06 LAB — HEMOGLOBIN A1C
Hgb A1c MFr Bld: 5.6 % (ref 4.8–5.6)
Mean Plasma Glucose: 114.02 mg/dL

## 2023-03-06 LAB — GLUCOSE, CAPILLARY: Glucose-Capillary: 136 mg/dL — ABNORMAL HIGH (ref 70–99)

## 2023-03-06 MED ORDER — ARFORMOTEROL TARTRATE 15 MCG/2ML IN NEBU
15.0000 ug | INHALATION_SOLUTION | Freq: Two times a day (BID) | RESPIRATORY_TRACT | Status: DC
  Administered 2023-03-06 – 2023-03-09 (×7): 15 ug via RESPIRATORY_TRACT
  Filled 2023-03-06 (×7): qty 2

## 2023-03-06 MED ORDER — LOSARTAN POTASSIUM 50 MG PO TABS
100.0000 mg | ORAL_TABLET | Freq: Every day | ORAL | Status: DC
  Administered 2023-03-06 – 2023-03-09 (×4): 100 mg via ORAL
  Filled 2023-03-06 (×4): qty 2

## 2023-03-06 MED ORDER — BUDESONIDE 0.5 MG/2ML IN SUSP
0.5000 mg | Freq: Two times a day (BID) | RESPIRATORY_TRACT | Status: DC
  Administered 2023-03-06 – 2023-03-09 (×7): 0.5 mg via RESPIRATORY_TRACT
  Filled 2023-03-06 (×7): qty 2

## 2023-03-06 MED ORDER — POTASSIUM CHLORIDE CRYS ER 20 MEQ PO TBCR: 30.0000 meq | EXTENDED_RELEASE_TABLET | ORAL | Status: DC

## 2023-03-06 MED ORDER — BENZONATATE 100 MG PO CAPS
200.0000 mg | ORAL_CAPSULE | Freq: Three times a day (TID) | ORAL | Status: DC | PRN
  Administered 2023-03-06 (×2): 200 mg via ORAL
  Filled 2023-03-06 (×2): qty 2

## 2023-03-06 MED ORDER — HYDROCODONE BIT-HOMATROP MBR 5-1.5 MG/5ML PO SOLN
5.0000 mL | ORAL | Status: DC | PRN
  Administered 2023-03-06 – 2023-03-08 (×5): 5 mL via ORAL
  Filled 2023-03-06 (×5): qty 5

## 2023-03-06 MED ORDER — INSULIN ASPART 100 UNIT/ML IJ SOLN
0.0000 [IU] | Freq: Three times a day (TID) | INTRAMUSCULAR | Status: DC
  Administered 2023-03-08: 1 [IU] via SUBCUTANEOUS

## 2023-03-06 NOTE — Progress Notes (Addendum)
PROGRESS NOTE    Jenna Chandler  NFA:[id number] DOB: 1965/10/18 DOA: 03/04/2023 PCP: Fleet Contras, MD    Brief Narrative:   Jenna Chandler is a 58 y.o. female with past medical history significant for chronic diastolic congestive heart failure, CKD stage IIIa, COPD, DM2, fibromyalgia, OSA on CPAP, morbid obesity who presented to [facility] ED on 1/26 from home via EMS with progressive shortness of breath, wheezing, fever.  Reports she was using her home nebulizer more without much relief.  Endorses positive sick contacts at home with influenza.  In the ED, temperature 100.7 F, HR 100, RR 22, BP 164/112, SpO2 86% on room air.  WBC 3.8, hemoglobin 14.5, platelet count 186.  Sodium 139, potassium 3.2, chloride 96, CO2 29, glucose 103, BUN 7, creat 1.09.  Influenza A PCR positive.  RSV/COVID PCR negative.  Urinalysis unrevealing.  Chest x-ray with no active cardiopulmonary disease process.  Left foot x-ray with no acute fracture/dislocation, soft tissue swelling over the dorsum of the foot.  Rase consulted for admission for further evaluation management of acute hypoxic respite failure secondary to influenza A viral infection and COPD exacerbation.  Assessment & Plan:   Acute hypoxic respiratory failure Patient presenting to the ED with progressive shortness of breath, wheezing and fever.  To 86% on room air.  Found to be positive for influenza A.  Likely complicating COPD exacerbation as well. -- Continue supplemental oxygen, maintain SpO2 greater than 88% -- Continue treatment as below -- Ambulatory O2 screen today  Influenza A viral infection Patient febrile 100.7 F on admission.  Influenza A PCR positive. -- Tamiflu 75 mg p.o. twice daily x 5 days -- Incentive spirometry/flutter valve -- Droplet precaution  COPD exacerbation -- Brovana neb twice daily -- Pulmicort neb twice daily -- DuoNeb scheduled every 6 hours -- Albuterol neb every 2 hours as needed wheezing/shortness of breath --  Doxycycline 100 mg IV q12h -- Prednisone 40 mg PO daily -- Hycodan 5 mL every 4 hours as needed cough -- Continue supplemental oxygen, maintain SpO2 > 88%  Hypokalemia Potassium 3.1 on 1/27; repleted. -- Repeat electrolytes in a.m. to include magnesium  Chronic diastolic congestive heart failure, compensated Essential hypertension -- Amlodipine 10 mg p.o. daily -- Clonidine 0.2 mg p.o. twice daily -- Restart losartan 100mg  Po daily today -- continue to hold home HCTZ  Type 2 diabetes mellitus Hemoglobin A1c 6.5 on 10/04/2019, well-controlled.  On Mounjaro and diet controlled at baseline. -- very sensitive SSI for coverage -- CBGs qAC/HS  CKD stage IIIa Creatinine 1.03, stable at baseline.  OSA -- Continue nocturnal CPAP  Obesity, class III Body mass index is 73.81 kg/m.  On [facility] outpatient.  Complicates all facets of care  Weakness/debility/deconditioning/gait disturbance: Utilizes a walker at baseline. -- PT evaluation: pending  DVT prophylaxis:     Code Status: Full Code Family Communication: No family present at bedside this morning  Disposition Plan:  Level of care: Telemetry Medical Status is: Inpatient Remains inpatient appropriate because: Needs weaning from oxygen; anticipate discharge home in 1-2 days    Consultants:  None  Procedures:  None  Antimicrobials:  Doxycycline   Subjective: Patient seen examined bedside, resting calmly.  Lying in bed.  Continues with shortness of breath, wheezing; slightly improved since yesterday.  Discussed will start further neb treatments with Brovana and Pulmicort.  Discussed will attempt weaning off of oxygen and PT evaluation.  No other specific questions, concerns or complaints at this time.  Denies headache, no visual  changes, no chest pain, no palpitations, no fever/chills/night sweats, no nausea cefonicid diarrhea, no focal weakness, no fatigue, no paresthesias.  No acute events overnight per nursing  staff.  Objective: Vitals:   03/06/23 0742 03/06/23 0748 03/06/23 1138 03/06/23 1140  BP:  (!) 163/85    Pulse:  63    Resp:  18    Temp:  98.4 F (36.9 C)    TempSrc:      SpO2: 97% 100% 97% 97%  Weight:      Height:        Intake/Output Summary (Last 24 hours) at 03/06/2023 1214 Last data filed at 03/06/2023 1032 Gross per 24 hour  Intake 822.41 ml  Output 1000 ml  Net -177.59 ml   Filed Weights   03/04/23 2028  Weight: (!) 195 kg    Examination:  Physical Exam: GEN: NAD, alert and oriented x 3, obese HEENT: NCAT, PERRL, EOMI, sclera clear, MMM PULM: Diminished breath sounds bilateral bases with wheezing throughout all lung fields, no crackles, normal respiratory effort without accessory muscle use, on 2 L nasal cannula with SpO2 95% at rest CV: RRR w/o M/G/R GI: abd soft, NTND, NABS, no R/G/M MSK: no peripheral edema, muscle strength globally intact 5/5 bilateral upper/lower extremities NEURO: CN II-XII intact, no focal deficits, sensation to light touch intact PSYCH: normal mood/affect Integumentary: No concerning rashes/lesions/wounds noted on exposed skin surfaces.    Data Reviewed: I have personally reviewed following labs and imaging studies  CBC: Recent Labs  Lab 03/04/23 2026 03/05/23 0555  WBC 3.8* 3.7*  NEUTROABS 2.2  --   HGB 14.5 14.3  HCT 45.5 44.9  MCV 91.4 91.3  PLT 186 163   Basic Metabolic Panel: Recent Labs  Lab 03/04/23 2026 03/05/23 0554 03/05/23 0555  NA 139  --  133*  K 3.2*  --  3.1*  CL 96*  --  94*  CO2 29  --  26  GLUCOSE 103*  --  122*  BUN 7  --  11  CREATININE 1.09*  --  1.02*  1.01*  CALCIUM 8.6*  --  8.3*  MG  --  2.0  --    GFR: Estimated Creatinine Clearance: 103.9 mL/min (A) (by C-G formula based on SCr of 1.02 mg/dL (H)). Liver Function Tests: Recent Labs  Lab 03/05/23 0555  AST 44*  ALT 16  ALKPHOS 49  BILITOT 0.9  PROT 6.8  ALBUMIN 3.2*   No results for input(s): "LIPASE", "AMYLASE" in the last  168 hours. No results for input(s): "AMMONIA" in the last 168 hours. Coagulation Profile: No results for input(s): "INR", "PROTIME" in the last 168 hours. Cardiac Enzymes: No results for input(s): "CKTOTAL", "CKMB", "CKMBINDEX", "TROPONINI" in the last 168 hours. BNP (last 3 results) No results for input(s): "PROBNP" in the last 8760 hours. HbA1C: No results for input(s): "HGBA1C" in the last 72 hours. CBG: No results for input(s): "GLUCAP" in the last 168 hours. Lipid Profile: No results for input(s): "CHOL", "HDL", "LDLCALC", "TRIG", "CHOLHDL", "LDLDIRECT" in the last 72 hours. Thyroid Function Tests: No results for input(s): "TSH", "T4TOTAL", "FREET4", "T3FREE", "THYROIDAB" in the last 72 hours. Anemia Panel: No results for input(s): "VITAMINB12", "FOLATE", "FERRITIN", "TIBC", "IRON", "RETICCTPCT" in the last 72 hours. Sepsis Labs: No results for input(s): "PROCALCITON", "LATICACIDVEN" in the last 168 hours.  Recent Results (from the past 240 hours)  Resp panel by RT-PCR (RSV, Flu A&B, Covid) Anterior Nasal Swab     Status: Abnormal   Collection Time:  03/04/23  8:45 PM   Specimen: Anterior Nasal Swab  Result Value Ref Range Status   SARS Coronavirus 2 by RT PCR NEGATIVE NEGATIVE Final   Influenza A by PCR POSITIVE (A) NEGATIVE Final   Influenza B by PCR NEGATIVE NEGATIVE Final    Comment: (NOTE) The Xpert Xpress SARS-CoV-2/FLU/RSV plus assay is intended as an aid in the diagnosis of influenza from Nasopharyngeal swab specimens and should not be used as a sole basis for treatment. Nasal washings and aspirates are unacceptable for Xpert Xpress SARS-CoV-2/FLU/RSV testing.  Fact Sheet for Patients: BloggerCourse.com  Fact Sheet for Healthcare Providers: SeriousBroker.it  This test is not yet approved or cleared by the [place] FDA and has been authorized for detection and/or diagnosis of SARS-CoV-2 by FDA under an  Emergency Use Authorization (EUA). This EUA will remain in effect (meaning this test can be used) for the duration of the COVID-19 declaration under Section 564(b)(1) of the Act, 21 U.S.C. section 360bbb-3(b)(1), unless the authorization is terminated or revoked.     Resp Syncytial Virus by PCR NEGATIVE NEGATIVE Final    Comment: (NOTE) Fact Sheet for Patients: BloggerCourse.com  Fact Sheet for Healthcare Providers: SeriousBroker.it  This test is not yet approved or cleared by the [place] FDA and has been authorized for detection and/or diagnosis of SARS-CoV-2 by FDA under an Emergency Use Authorization (EUA). This EUA will remain in effect (meaning this test can be used) for the duration of the COVID-19 declaration under Section 564(b)(1) of the Act, 21 U.S.C. section 360bbb-3(b)(1), unless the authorization is terminated or revoked.  Performed at [facility], [address], [place], [place] [address]          Radiology Studies: DG Chest Port 1 View Result Date: 03/04/2023 CLINICAL DATA:  SOB EXAM: PORTABLE CHEST 1 VIEW COMPARISON:  Chest x-ray 08/22/2021 FINDINGS: Limited evaluation due to overlapping osseous structures and overlying soft tissues. The heart and mediastinal contours are unchanged. No focal consolidation. No pulmonary edema. No pleural effusion. No pneumothorax. No acute osseous abnormality. Degenerative changes of the shoulders. IMPRESSION: No active disease. Electronically Signed   By: Tish Frederickson M.D.   On: 03/04/2023 21:51   DG Foot Complete Left Result Date: 03/04/2023 CLINICAL DATA:  Shortness of breath, left foot pain. EXAM: LEFT FOOT - COMPLETE 3+ VIEW COMPARISON:  None Available. FINDINGS: There is no evidence of fracture or dislocation. Joint spaces are maintained. There is osteophyte formation over the dorsal midfoot compatible with mild degenerative change. There is soft tissue  swelling over the dorsum of the foot. IMPRESSION: 1. No acute fracture or dislocation. 2. Soft tissue swelling over the dorsum of the foot. Electronically Signed   By: Darliss Cheney M.D.   On: 03/04/2023 21:43        Scheduled Meds:  amLODipine  10 mg Oral Daily   arformoterol  15 mcg Nebulization BID   budesonide (PULMICORT) nebulizer solution  0.5 mg Nebulization BID   cloNIDine  0.2 mg Oral BID   enoxaparin (LOVENOX) injection  90 mg Subcutaneous Q24H   ipratropium-albuterol  3 mL Nebulization Q6H   oseltamivir  75 mg Oral BID   predniSONE  40 mg Oral Q breakfast   Continuous Infusions:  doxycycline (VIBRAMYCIN) IV 100 mg (03/06/23 0138)     LOS: 2 days    Time spent: 52 minutes spent on chart review, discussion with nursing staff, consultants, updating family and interview/physical exam; more than 50% of that time was spent  in counseling and/or coordination of care.    Alvira Philips [place], DO [facility] Available via Epic secure chat 7am-7pm After these hours, please refer to coverage provider listed on [email or web address] 03/06/2023, 12:14 PM

## 2023-03-06 NOTE — Evaluation (Signed)
Physical Therapy Evaluation Patient Details Name: TOMEEKA PLAUGHER MRN: [id number] DOB: April 06, 1965 Today's Date: 03/06/2023  History of Present Illness  58 y.o. female presents to [facility] hospital on 03/04/2023 with SOB, fever, wheezing. Pt found to be positive for fluA. PMH includes OSA, depression, chronic diastolic HF, CKD III, COPD, DM, fibromyalgia.  Clinical Impression  Pt presents to PT with deficits in activity tolerance and cardiopulmonary function. Pt demonstrates increased work of breathing and wheezing with ambulation despite reports of a recent breathing treatment prior to PT evaluation. Pt sats remain stable with activity on room air. PT encourages frequent mobilization with staff assistance in an effort to improve activity tolerance. PT anticipates no post-acute PT needs.        If plan is discharge home, recommend the following: A little help with bathing/dressing/bathroom;Assistance with cooking/housework;Assist for transportation;Help with stairs or ramp for entrance   Can travel by private vehicle        Equipment Recommendations None recommended by PT  Recommendations for Other Services       Functional Status Assessment Patient has had a recent decline in their functional status and demonstrates the ability to make significant improvements in function in a reasonable and predictable amount of time.     Precautions / Restrictions Precautions Precautions: Fall Precaution Comments: monitor sats Restrictions Weight Bearing Restrictions Per Provider Order: No      Mobility  Bed Mobility Overal bed mobility: Modified Independent             General bed mobility comments: increased time, use of rails    Transfers Overall transfer level: Needs assistance Equipment used: Rollator (4 wheels) Transfers: Sit to/from Stand Sit to Stand: Supervision                Ambulation/Gait Ambulation/Gait assistance: Supervision Gait Distance (Feet): 300  Feet Assistive device: Rollator (4 wheels) Gait Pattern/deviations: Step-through pattern, Wide base of support Gait velocity: reduced Gait velocity interpretation: 1.31 - 2.62 ft/sec, indicative of limited community ambulator   General Gait Details: slowed step-through gait with widened BOS, one brief standing rest break  Stairs            Wheelchair Mobility     Tilt Bed    Modified Rankin (Stroke Patients Only)       Balance Overall balance assessment: Needs assistance Sitting-balance support: No upper extremity supported, Feet supported Sitting balance-Leahy Scale: Good     Standing balance support: Single extremity supported, Reliant on assistive device for balance Standing balance-Leahy Scale: Poor                               Pertinent Vitals/Pain Pain Assessment Pain Assessment: No/denies pain    Home Living Family/patient expects to be discharged to:: Private residence Living Arrangements: Alone Available Help at Discharge: Family;Available PRN/intermittently Type of Home: Apartment Home Access: Level entry       Home Layout: One level Home Equipment: Rollator (4 wheels)      Prior Function Prior Level of Function : Independent/Modified Independent;Driving             Mobility Comments: ambulatory with rollator       Extremity/Trunk Assessment   Upper Extremity Assessment Upper Extremity Assessment: Overall WFL for tasks assessed    Lower Extremity Assessment Lower Extremity Assessment: Overall WFL for tasks assessed    Cervical / Trunk Assessment Cervical / Trunk Assessment: Other exceptions Cervical / Trunk Exceptions: body  habitus  Communication   Communication Communication: No apparent difficulties Cueing Techniques: Verbal cues  Cognition Arousal: Alert Behavior During Therapy: WFL for tasks assessed/performed Overall Cognitive Status: Within Functional Limits for tasks assessed                                           General Comments General comments (skin integrity, edema, etc.): pt on RA upon PT arrival, with ambulation PT notes wheezing and increased work of breathing. Pt sats upon completion of ambulation are at 92% with mild tachycardia into 110s    Exercises     Assessment/Plan    PT Assessment Patient needs continued PT services  PT Problem List Decreased activity tolerance;Cardiopulmonary status limiting activity       PT Treatment Interventions DME instruction;Gait training;Functional mobility training;Therapeutic activities;Therapeutic exercise;Balance training;Patient/family education    PT Goals (Current goals can be found in the Care Plan section)  Acute Rehab PT Goals Patient Stated Goal: to improve activity tolerance PT Goal Formulation: With patient Time For Goal Achievement: 03/20/23 Potential to Achieve Goals: Good    Frequency Min 1X/week     Co-evaluation               AM-PAC PT "6 Clicks" Mobility  Outcome Measure Help needed turning from your back to your side while in a flat bed without using bedrails?: None Help needed moving from lying on your back to sitting on the side of a flat bed without using bedrails?: None Help needed moving to and from a bed to a chair (including a wheelchair)?: A Little Help needed standing up from a chair using your arms (e.g., wheelchair or bedside chair)?: A Little Help needed to walk in hospital room?: A Little Help needed climbing 3-5 steps with a railing? : A Lot 6 Click Score: 19    End of Session   Activity Tolerance: Patient tolerated treatment well Patient left: in bed;with call bell/phone within reach Nurse Communication: Mobility status PT Visit Diagnosis: Other abnormalities of gait and mobility (R26.89)    Time: 2725-3664 PT Time Calculation (min) (ACUTE ONLY): 23 min   Charges:   PT Evaluation $PT Eval Low Complexity: 1 Low   PT General Charges $$ ACUTE PT VISIT: 1  Visit         Arlyss Gandy, PT, DPT Acute Rehabilitation Office [phone]   Arlyss Gandy 03/06/2023, 1:49 PM

## 2023-03-06 NOTE — Plan of Care (Signed)
Problem: Education: Goal: Knowledge of General Education information will improve Description: Including pain rating scale, medication(s)/side effects and non-pharmacologic comfort measures Outcome: Progressing   Problem: Health Behavior/Discharge Planning: Goal: Ability to manage health-related needs will improve Outcome: Progressing   Problem: Clinical Measurements: Goal: Ability to maintain clinical measurements within normal limits will improve Outcome: Progressing Goal: Will remain free from infection Outcome: Progressing Goal: Diagnostic test results will improve Outcome: Progressing Goal: Respiratory complications will improve Outcome: Progressing Goal: Cardiovascular complication will be avoided Outcome: Progressing   Problem: Clinical Measurements: Goal: Will remain free from infection Outcome: Progressing   Problem: Clinical Measurements: Goal: Respiratory complications will improve Outcome: Progressing

## 2023-03-06 NOTE — Progress Notes (Signed)
TR[facility] night cross cover note:   I was notified by RN of the patient's request for medication for cough.  I subsequently ordered prn Tessalon Perles as well as flutter valve and incentive spirometry.     Newton Pigg, DO Hospitalist

## 2023-03-07 ENCOUNTER — Inpatient Hospital Stay (HOSPITAL_COMMUNITY): Payer: Medicaid Other

## 2023-03-07 DIAGNOSIS — R079 Chest pain, unspecified: Secondary | ICD-10-CM | POA: Diagnosis not present

## 2023-03-07 DIAGNOSIS — J9621 Acute and chronic respiratory failure with hypoxia: Secondary | ICD-10-CM | POA: Diagnosis not present

## 2023-03-07 LAB — BASIC METABOLIC PANEL
Anion gap: 12 (ref 5–15)
Anion gap: 11 (ref 5–15)
BUN: 20 mg/dL (ref 6–20)
BUN: 19 mg/dL (ref 6–20)
CO2: 29 mmol/L (ref 22–32)
CO2: 29 mmol/L (ref 22–32)
Calcium: 8.2 mg/dL — ABNORMAL LOW (ref 8.9–10.3)
Calcium: 8.4 mg/dL — ABNORMAL LOW (ref 8.9–10.3)
Chloride: 97 mmol/L — ABNORMAL LOW (ref 98–111)
Chloride: 97 mmol/L — ABNORMAL LOW (ref 98–111)
Creatinine, Ser: 1.02 mg/dL — ABNORMAL HIGH (ref 0.44–1.00)
Creatinine, Ser: 1.1 mg/dL — ABNORMAL HIGH (ref 0.44–1.00)
GFR, Estimated: >60 mL/min (ref >60)
GFR, Estimated: 58 mL/min — ABNORMAL LOW (ref >60)
Glucose, Bld: 94 mg/dL (ref 70–99)
Glucose, Bld: 121 mg/dL — ABNORMAL HIGH (ref 70–99)
Potassium: 2.9 mmol/L — ABNORMAL LOW (ref 3.5–5.1)
Potassium: 3.5 mmol/L (ref 3.5–5.1)
Sodium: 138 mmol/L (ref 135–145)
Sodium: 137 mmol/L (ref 135–145)

## 2023-03-07 LAB — BLOOD GAS, ARTERIAL
Acid-Base Excess: 4.5 mmol/L — ABNORMAL HIGH (ref 0.0–2.0)
Bicarbonate: 29.8 mmol/L — ABNORMAL HIGH (ref 20.0–28.0)
Drawn by: 33176
O2 Saturation: 98.4 %
Patient temperature: 37
pCO2 arterial: 46 mm[Hg] (ref 32–48)
pH, Arterial: 7.42 (ref 7.35–7.45)
pO2, Arterial: 88 mm[Hg] (ref 83–108)

## 2023-03-07 LAB — ECHOCARDIOGRAM COMPLETE
Area-P 1/2: 2.48 cm2
Height: 64 in
S' Lateral: 2.9 cm
Weight: 6880 [oz_av]

## 2023-03-07 LAB — GLUCOSE, CAPILLARY
Glucose-Capillary: 108 mg/dL — ABNORMAL HIGH (ref 70–99)
Glucose-Capillary: 123 mg/dL — ABNORMAL HIGH (ref 70–99)
Glucose-Capillary: 140 mg/dL — ABNORMAL HIGH (ref 70–99)
Glucose-Capillary: 143 mg/dL — ABNORMAL HIGH (ref 70–99)
Glucose-Capillary: 96 mg/dL (ref 70–99)
Glucose-Capillary: 99 mg/dL (ref 70–99)

## 2023-03-07 LAB — BRAIN NATRIURETIC PEPTIDE: B Natriuretic Peptide: 25.2 pg/mL (ref 0.0–100.0)

## 2023-03-07 LAB — MAGNESIUM: Magnesium: 2.1 mg/dL (ref 1.7–2.4)

## 2023-03-07 MED ORDER — POTASSIUM CHLORIDE CRYS ER 20 MEQ PO TBCR
40.0000 meq | EXTENDED_RELEASE_TABLET | ORAL | Status: AC
  Administered 2023-03-07 (×2): 40 meq via ORAL
  Filled 2023-03-07 (×2): qty 2

## 2023-03-07 MED ORDER — POTASSIUM CHLORIDE 10 MEQ/100ML IV SOLN
10.0000 meq | INTRAVENOUS | Status: DC
  Administered 2023-03-07: 10 meq via INTRAVENOUS
  Filled 2023-03-07: qty 100

## 2023-03-07 NOTE — Plan of Care (Signed)

## 2023-03-07 NOTE — Progress Notes (Signed)
Mobility Specialist: Progress Note   03/07/23 1214  Mobility  Activity Ambulated with assistance in hallway  Level of Assistance Standby assist, set-up cues, supervision of patient - no hands on  Assistive Device Front wheel walker  Distance Ambulated (ft) 300 ft  Activity Response Tolerated well  Mobility Referral Yes  Mobility visit 1 Mobility  Mobility Specialist Start Time (ACUTE ONLY) 0843  Mobility Specialist Stop Time (ACUTE ONLY) 0900  Mobility Specialist Time Calculation (min) (ACUTE ONLY) 17 min    Pre-Mobility: SpO2 93% RA During Mobility: SpO2 86-91% RA Post-Mobility: SpO2 96% RA  Pt was agreeable to mobility session - received on EOB. SV throughout with RW. Desat to 86-87% RA during ambulation but able to recover [facility] with standing break and PLB. C/o SOB and pt was fatigued at EOS. Returned to room without fault. Left on EOB with all needs met, call bell in reach.   Maurene Capes Mobility Specialist Please contact via SecureChat or Rehab office at [phone]

## 2023-03-07 NOTE — Progress Notes (Signed)
  Echocardiogram 2D Echocardiogram has been performed.  Delcie Roch 03/07/2023, 4:24 PM

## 2023-03-07 NOTE — Progress Notes (Signed)
PROGRESS NOTE Jenna Chandler  ZOX:[id number] DOB: 1965/05/20 DOA: 03/04/2023 PCP: Fleet Contras, MD  Brief Narrative/Hospital Course: 19 yof w/ significant history of chronic diastolic congestive heart failure, CKD stage IIIa, COPD, DM2, fibromyalgia, OSA on CPAP, morbid obesity who presented to [facility] ED on 03/04/23 from home via EMS with progressive shortness of breath, wheezing, fever and no improvement despite home nebulizer, and with positive sick contacts at home with influenza In the ED: temperature 100.7 F, HR 100, RR 22, BP 164/112, SpO2 86% on room air. LABS:WBC 3.8, hemoglobin 14.5, platelet count 186.  Sodium 139, potassium 3.2, chloride 96, CO2 29, glucose 103, BUN 7, creat 1.09.  Influenza A PCR positive.  RSV/COVID PCR negative.  Urinalysis unrevealing. Cxr>>no active cardiopulmonary disease process. Left foot x-ray with no acute fracture/dislocation, soft tissue swelling over the dorsum of the foot.   Patient was admitted for further management.    Subjective: Patient seen examined Feeling much better now but while getting IV potassium infusion she is complaining of left-sided burning pain feeling dizzy and also difficulty breathing. -Potassium discontinued also obtain chest x-ray ABG and currently she is calm down feels much better no asterixis or tremor.   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  Chest x-ray no obvious pneumonia ABG obtained  this morning unremarkable, repeat chest x-ray pending.  She does have wheezing on expiration with good air entry bilaterally, will continue on supplemental oxygen, Tamiflu x 5 days course and systemic steroids, doxycycline bronchodilators including Brovana/Pulmicort DuoNeb, antitussive with Hycodan.  Encourage I-S, ambulation and wean oxygen as tolerated.  Hypokalemia: Replacing aggressively this morning.  Mag is normal.  Patient seems to not tolerate IV potassium as she was having burning of her left side feeling dizzy- iv kcl stopped will continue oral supplement Recent Labs  Lab 03/04/23 2026 03/05/23 0554 03/05/23 0555 03/07/23 0614  K 3.2*  --  3.1* 2.9*  CALCIUM 8.6*  --  8.3* 8.2*  MG  --  2.0  --  2.1    Chronic diastolic CHF Essential hypertension: CHF is compensated.  BP well-controlled.  Continue patient's clonidine 0.2 twice daily, amlodipine 10, losartan 100, and monitor fluid balance closely Due to episode of bradycardia shortness of breath getting BNP and echocardiogram today  CKD stage IIIa: Renal function stable at baseline Recent Labs    09/15/22 0000 03/04/23 2026 03/05/23 0555 03/07/23 0614  BUN 15 7 11 20   CREATININE 0.79 1.09* 1.02*  1.01* 1.02*  CO2 25 29 26 29   K 4.8 3.2* 3.1* 2.9*    T2DM-diet controlled On Mounjaro and diet controlled at baseline continue SSI and monitor closely while on steroid.  A1c is normal Recent Labs  Lab 03/06/23 1244 03/06/23 1558 03/07/23 0059 03/07/23 0755 03/07/23 1004 03/07/23 1123  GLUCAP  --  136* 96 99 140* 108*  HGBA1C 5.6  --   --   --   --   --      OSA  on CPAP: Continue the same nocturnal limb  Deconditioning/debility and weakness and gait disturbance: Uses walker at baseline continue PT OT  Morbid obesity with Body mass index is 73.81 kg/m. : Will benefit with PCP follow-up, weight loss  healthy lifestyle   DVT prophylaxis: Lovenox Code Status:   Code Status: Full Code Family Communication: plan  of care discussed with patient at bedside. Patient status is: Remains hospitalized because of severity of illness Level of care: Telemetry Medical   Dispo: The patient is from: home with daughter and grandkids            Anticipated disposition: TBD Objective: Vitals last 24 hrs: Vitals:   03/07/23 0738 03/07/23 0739 03/07/23 0745 03/07/23 1012  BP:   123/80 111/74  Pulse:   64 78  Resp:   20   Temp:   98.6 F (37 C)   TempSrc:   Oral   SpO2: 95% 95% 100% 100%  Weight:      Height:       Weight change:   Physical Examination: General exam: alert awake,at baseline, older than stated age motor, very obese but pleasant HEENT:Oral mucosa moist, Ear/Nose WNL grossly Respiratory system: Bilaterally air entry present with expiratory wheezing,no use of accessory muscle Cardiovascular system: S1 & S2 +, No JVD. Gastrointestinal system: Abdomen soft,NT,ND, BS+ Nervous System: Alert, awake, moving all extremities,and following commands, no tremors Extremities: LE edema neg,distal peripheral pulses palpable and warm.  Skin: No rashes,no icterus. MSK: Normal muscle bulk,tone, power   Medications reviewed:  Scheduled Meds:  amLODipine  10 mg Oral Daily   arformoterol  15 mcg Nebulization BID   budesonide (PULMICORT) nebulizer solution  0.5 mg Nebulization BID   cloNIDine  0.2 mg Oral BID   enoxaparin (LOVENOX) injection  90 mg Subcutaneous Q24H   insulin aspart  0-6 Units Subcutaneous TID WC   ipratropium-albuterol  3 mL Nebulization Q6H   losartan  100 mg Oral Daily   oseltamivir  75 mg Oral BID   potassium chloride  40 mEq Oral Q3H   predniSONE  40 mg Oral Q breakfast   Continuous Infusions:  doxycycline (VIBRAMYCIN) IV 100 mg (03/07/23 1119)   potassium chloride 10 mEq (03/07/23 0933)    Diet Order             Diet Heart Room service appropriate? Yes; Fluid consistency: Thin  Diet effective now                   Intake/Output Summary (Last 24 hours) at 03/07/2023  1127 Last data filed at 03/07/2023 0745 Gross per 24 hour  Intake 354 ml  Output 1275 ml  Net -921 ml   Net IO Since Admission: -1,098.59 mL [03/07/23 1127]  Wt Readings from Last 3 Encounters:  03/04/23 (!) 195 kg  08/22/21 (!) 208.2 kg  02/25/21 (!) 194.6 kg     [employer] (From admission, onward)     Start     Ordered   03/11/23 0500  Creatinine, serum  (enoxaparin (LOVENOX)    CrCl >/= 30 ml/min)  Weekly,   R     Comments: while on enoxaparin therapy    03/04/23 2353   03/08/23 0500  CBC  Daily,   R      03/07/23 1010   03/08/23 0500  Comprehensive metabolic panel  Daily,   R      03/07/23 1010   03/07/23 1300  Basic metabolic panel  Once-Timed,   TIMED  03/07/23 1010   03/07/23 1007  Brain natriuretic peptide  Add-on,   AD        03/07/23 1007          Data Reviewed: I have personally reviewed following labs and imaging studies CBC: Recent Labs  Lab 03/04/23 2026 03/05/23 0555  WBC 3.8* 3.7*  NEUTROABS 2.2  --   HGB 14.5 14.3  HCT 45.5 44.9  MCV 91.4 91.3  PLT 186 163   Basic Metabolic Panel:  Recent Labs  Lab 03/04/23 2026 03/05/23 0554 03/05/23 0555 03/07/23 0614  NA 139  --  133* 138  K 3.2*  --  3.1* 2.9*  CL 96*  --  94* 97*  CO2 29  --  26 29  GLUCOSE 103*  --  122* 94  BUN 7  --  11 20  CREATININE 1.09*  --  1.02*  1.01* 1.02*  CALCIUM 8.6*  --  8.3* 8.2*  MG  --  2.0  --  2.1   GFR: Estimated Creatinine Clearance: 103.9 mL/min (A) (by C-G formula based on SCr of 1.02 mg/dL (H)). Liver Function Tests:  Recent Labs  Lab 03/05/23 0555  AST 44*  ALT 16  ALKPHOS 49  BILITOT 0.9  PROT 6.8  ALBUMIN 3.2*   Recent Labs    03/06/23 1244  HGBA1C 5.6  Sepsis Labs: No results for input(s): "PROCALCITON", "LATICACIDVEN" in the last 168 hours. Recent Results (from the past 240 hours)  Resp panel by RT-PCR (RSV, Flu A&B, Covid) Anterior Nasal Swab     Status: Abnormal   Collection Time: 03/04/23  8:45 PM   Specimen: Anterior  Nasal Swab  Result Value Ref Range Status   SARS Coronavirus 2 by RT PCR NEGATIVE NEGATIVE Final   Influenza A by PCR POSITIVE (A) NEGATIVE Final   Influenza B by PCR NEGATIVE NEGATIVE Final    Comment: (NOTE) The Xpert Xpress SARS-CoV-2/FLU/RSV plus assay is intended as an aid in the diagnosis of influenza from Nasopharyngeal swab specimens and should not be used as a sole basis for treatment. Nasal washings and aspirates are unacceptable for Xpert Xpress SARS-CoV-2/FLU/RSV testing.  Fact Sheet for Patients: BloggerCourse.com  Fact Sheet for Healthcare Providers: SeriousBroker.it  This test is not yet approved or cleared by the [place] FDA and has been authorized for detection and/or diagnosis of SARS-CoV-2 by FDA under an Emergency Use Authorization (EUA). This EUA will remain in effect (meaning this test can be used) for the duration of the COVID-19 declaration under Section 564(b)(1) of the Act, 21 U.S.C. section 360bbb-3(b)(1), unless the authorization is terminated or revoked.     Resp Syncytial Virus by PCR NEGATIVE NEGATIVE Final    Comment: (NOTE) Fact Sheet for Patients: BloggerCourse.com  Fact Sheet for Healthcare Providers: SeriousBroker.it  This test is not yet approved or cleared by the [place] FDA and has been authorized for detection and/or diagnosis of SARS-CoV-2 by FDA under an Emergency Use Authorization (EUA). This EUA will remain in effect (meaning this test can be used) for the duration of the COVID-19 declaration under Section 564(b)(1) of the Act, 21 U.S.C. section 360bbb-3(b)(1), unless the authorization is terminated or revoked.  Performed at [facility], [address] [place], [place] [address]     Antimicrobials/Microbiology: Anti-infectives (From admission, onward)    Start     Dose/Rate Route Frequency Ordered Stop    03/05/23 0000  oseltamivir (TAMIFLU) capsule 75 mg  75 mg Oral 2 times daily 03/04/23 2353 03/09/23 2159   03/05/23 0000  doxycycline (VIBRAMYCIN) 100 mg in sodium chloride 0.9 % 250 mL IVPB        100 mg 125 mL/hr over 120 Minutes Intravenous Every 12 hours 03/04/23 2353           Component Value Date/Time   SDES BLOOD SITE NOT SPECIFIED 09/30/2019 1801   SPECREQUEST  09/30/2019 1801    BOTTLES DRAWN AEROBIC ONLY Blood Culture adequate volume   CULT  09/30/2019 1801    NO GROWTH 5 DAYS Performed at [facility], [address], [place], [place] [address]    REPTSTATUS 10/05/2019 FINAL 09/30/2019 1801     Radiology Studies: No results found.   LOS: 3 days   Total time spent in review of labs and imaging, patient evaluation, formulation of plan, documentation and communication with family: 50 minutes  Lanae Boast, MD  [facility]  03/07/2023, 11:27 AM

## 2023-03-07 NOTE — Hospital Course (Addendum)
32 yof w/ significant history of chronic diastolic congestive heart failure, CKD stage IIIa, COPD, DM2, fibromyalgia, OSA on CPAP, morbid obesity who presented to [facility] ED on 03/04/23 from home via EMS with progressive shortness of breath, wheezing, fever and no improvement despite home nebulizer, and with positive sick contacts at home with influenza In the ED: temperature 100.7 F, HR 100, RR 22, BP 164/112, SpO2 86% on room air. LABS:WBC 3.8, hemoglobin 14.5, platelet count 186.  Sodium 139, potassium 3.2, chloride 96, CO2 29, glucose 103, BUN 7, creat 1.09.  Influenza A PCR positive.  RSV/COVID PCR negative.  Urinalysis unrevealing. Cxr>>no active cardiopulmonary disease process. Left foot x-ray with no acute fracture/dislocation, soft tissue swelling over the dorsum of the foot.   Patient was admitted for further management.

## 2023-03-08 DIAGNOSIS — J9621 Acute and chronic respiratory failure with hypoxia: Secondary | ICD-10-CM | POA: Diagnosis not present

## 2023-03-08 LAB — COMPREHENSIVE METABOLIC PANEL
ALT: 40 U/L (ref 0–44)
AST: 43 U/L — ABNORMAL HIGH (ref 15–41)
Albumin: 2.9 g/dL — ABNORMAL LOW (ref 3.5–5.0)
Alkaline Phosphatase: 41 U/L (ref 38–126)
Anion gap: 10 (ref 5–15)
BUN: 19 mg/dL (ref 6–20)
CO2: 27 mmol/L (ref 22–32)
Calcium: 8.2 mg/dL — ABNORMAL LOW (ref 8.9–10.3)
Chloride: 101 mmol/L (ref 98–111)
Creatinine, Ser: 0.86 mg/dL (ref 0.44–1.00)
GFR, Estimated: >60 mL/min (ref >60)
Glucose, Bld: 95 mg/dL (ref 70–99)
Potassium: 3.7 mmol/L (ref 3.5–5.1)
Sodium: 138 mmol/L (ref 135–145)
Total Bilirubin: 0.9 mg/dL (ref 0.0–1.2)
Total Protein: 5.7 g/dL — ABNORMAL LOW (ref 6.5–8.1)

## 2023-03-08 LAB — GLUCOSE, CAPILLARY
Glucose-Capillary: 104 mg/dL — ABNORMAL HIGH (ref 70–99)
Glucose-Capillary: 156 mg/dL — ABNORMAL HIGH (ref 70–99)
Glucose-Capillary: 135 mg/dL — ABNORMAL HIGH (ref 70–99)
Glucose-Capillary: 115 mg/dL — ABNORMAL HIGH (ref 70–99)

## 2023-03-08 LAB — CBC
HCT: 44.1 % (ref 36.0–46.0)
Hemoglobin: 14.2 g/dL (ref 12.0–15.0)
MCH: 29.2 pg (ref 26.0–34.0)
MCHC: 32.2 g/dL (ref 30.0–36.0)
MCV: 90.7 fL (ref 80.0–100.0)
Platelets: 162 10*3/uL (ref 150–400)
RBC: 4.86 MIL/uL (ref 3.87–5.11)
RDW: 13.4 % (ref 11.5–15.5)
WBC: 5.8 10*3/uL (ref 4.0–10.5)
nRBC: 0 % (ref 0.0–0.2)

## 2023-03-08 MED ORDER — IPRATROPIUM-ALBUTEROL 0.5-2.5 (3) MG/3ML IN SOLN
3.0000 mL | Freq: Four times a day (QID) | RESPIRATORY_TRACT | Status: DC | PRN
  Administered 2023-03-08: 3 mL via RESPIRATORY_TRACT
  Filled 2023-03-08: qty 3

## 2023-03-08 MED ORDER — DM-GUAIFENESIN ER 30-600 MG PO TB12
1.0000 | ORAL_TABLET | Freq: Two times a day (BID) | ORAL | Status: DC
  Administered 2023-03-08 – 2023-03-09 (×3): 1 via ORAL
  Filled 2023-03-08 (×3): qty 1

## 2023-03-08 MED ORDER — DOXYCYCLINE HYCLATE 100 MG PO TABS
100.0000 mg | ORAL_TABLET | Freq: Two times a day (BID) | ORAL | Status: DC
  Administered 2023-03-08 – 2023-03-09 (×2): 100 mg via ORAL
  Filled 2023-03-08 (×2): qty 1

## 2023-03-08 NOTE — Plan of Care (Signed)

## 2023-03-08 NOTE — Progress Notes (Signed)
PT Cancellation Note  Patient Details Name: ELIZAVETA MATTICE MRN: [id number] DOB: 1965-06-16   Cancelled Treatment:    Reason Eval/Treat Not Completed: (P) Patient declined, no reason specified (Pt states she has been up using the bathroom frequently today and does not feel like moving around. Pt reports feeling comfortable with mobility once returned home. Will continue to follow per PT POC.)   Johny Shock 03/08/2023, 3:43 PM

## 2023-03-08 NOTE — Progress Notes (Signed)
PROGRESS NOTE Jenna Chandler  RUE:[id number] DOB: 01/11/1966 DOA: 03/04/2023 PCP: Fleet Contras, MD  Brief Narrative/Hospital Course: 58 yof w/ significant history of chronic diastolic congestive heart failure, CKD stage IIIa, COPD, DM2, fibromyalgia, OSA on CPAP, morbid obesity who presented to [facility] ED on 03/04/23 from home via EMS with progressive shortness of breath, wheezing, fever and no improvement despite home nebulizer, and with positive sick contacts at home with influenza.   In the ED: temperature 100.7 F, HR 100, RR 22, BP 164/112, SpO2 86% on room air. LABS:WBC 3.8, hemoglobin 14.5, platelet count 186.  Sodium 139, potassium 3.2, chloride 96, CO2 29, glucose 103, BUN 7, creat 1.09.  Influenza A PCR positive.  RSV/COVID PCR negative.  Urinalysis unrevealing. Cxr>>no active cardiopulmonary disease process. Left foot x-ray with no acute fracture/dislocation, soft tissue swelling over the dorsum of the foot.   Patient was admitted for further management and treated as COPD exacerbation secondary to influenza infection.    Subjective:  Patient seen and examined.  Still has wheezing and congestion but much better than before.  Able to remain mostly on room air.  Hoping to walk today.  Remains afebrile.  She is hoping to get more mobility some comfort to go home.   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  Chest x-ray no obvious pneumonia.   will continue on supplemental oxygen, Tamiflu x 5 days course and systemic steroids, doxycycline bronchodilators including Brovana/Pulmicort DuoNeb, antitussive with Hycodan.  Encourage I-S, ambulation and wean oxygen as tolerated. Mobilize in the hallway. Will change to oral antibiotics and prednisone.  Hypokalemia: Replaced and improved.   Chronic diastolic CHF Essential hypertension: CHF is compensated.  BP well-controlled.  Continue patient's clonidine 0.2 twice daily, amlodipine 10, losartan 100, and monitor fluid balance closely Due to episode of bradycardia shortness of breath , echocardiogram was repeated and looks fairly stable.  CKD stage IIIa: Renal function stable at baseline Recent Labs    09/15/22 0000 03/04/23 2026 03/05/23 0555 03/07/23 0614 03/07/23 1325 03/08/23 0610  BUN 15 7 11 20 19 19   CREATININE 0.79 1.09* 1.02*  1.01* 1.02* 1.10* 0.86  CO2 25 29 26 29 29 27   K 4.8 3.2* 3.1* 2.9* 3.5 3.7    T2DM-diet controlled On Mounjaro and diet controlled at baseline continue SSI and monitor closely while on steroid.  A1c is normal Recent Labs  Lab 03/06/23 1244 03/06/23 1558 03/07/23 1004 03/07/23 1123 03/07/23 1724 03/07/23 2218 03/08/23 0847  GLUCAP  --    < > 140* 108* 143* 123* 104*  HGBA1C 5.6  --   --   --   --   --   --    < > = values in this interval not displayed.     OSA on CPAP: Using CPAP in the hospital.  Her CPAP machine is broken at home, she will need to reschedule with her provider to get new CPAP and she will call [facility].  Deconditioning/debility and weakness and gait disturbance: Uses walker at baseline continue PT OT  Morbid obesity with Body mass index is 73.81 kg/m. : Will benefit with PCP follow-up, weight loss . healthy lifestyle   DVT prophylaxis: Lovenox Code Status:   Code Status: Full Code Family Communication: None. Patient status is: Remains hospitalized because of severity of illness Level of care:  Telemetry Medical   Dispo: The patient is from: home with daughter and grandkids            Anticipated disposition: Anticipate home tomorrow. Objective: Vitals last 24 hrs: Vitals:   03/08/23 0711 03/08/23 0712 03/08/23 0713 03/08/23 0759  BP:    (!) 145/80  Pulse:    78  Resp:    19  Temp:    98 F (36.7 C)  TempSrc:    Oral  SpO2: 94% 94% 94% 95%  Weight:      Height:       Weight change:   General: Looks fairly comfortable.  On room air today. Cardiovascular: S1-S2 normal.  Regular rate rhythm. Respiratory: Bilateral expiratory wheezes.  Conducted upper airway sounds.  Good air entry and good inspiratory force. Gastrointestinal: Soft.  Obese and pendulous. Ext: No swelling or edema.  No cyanosis. Neuro: Alert awake and oriented.  Moves all extremities equally.   Medications reviewed:  Scheduled Meds:  amLODipine  10 mg Oral Daily   arformoterol  15 mcg Nebulization BID   budesonide (PULMICORT) nebulizer solution  0.5 mg Nebulization BID   cloNIDine  0.2 mg Oral BID   dextromethorphan-guaiFENesin  1 tablet Oral BID   enoxaparin (LOVENOX) injection  90 mg Subcutaneous Q24H   insulin aspart  0-6 Units Subcutaneous TID WC   ipratropium-albuterol  3 mL Nebulization Q6H   losartan  100 mg Oral Daily   oseltamivir  75 mg Oral BID   predniSONE  40 mg Oral Q breakfast   Continuous Infusions:  doxycycline (VIBRAMYCIN) IV 100 mg (03/08/23 0945)    Diet Order             Diet Heart Room service appropriate? Yes; Fluid consistency: Thin  Diet effective now                   Intake/Output Summary (Last 24 hours) at 03/08/2023 1052 Last data filed at 03/08/2023 1035 Gross per 24 hour  Intake 236 ml  Output 1100 ml  Net -864 ml   Net IO Since Admission: -1,962.59 mL [03/08/23 1052]  Wt Readings from Last 3 Encounters:  03/04/23 (!) 195 kg  08/22/21 (!) 208.2 kg  02/25/21 (!) 194.6 kg     Unresulted Labs (From admission, onward)     Start     Ordered    03/11/23 0500  Creatinine, serum  (enoxaparin (LOVENOX)    CrCl >/= 30 ml/min)  Weekly,   R     Comments: while on enoxaparin therapy    03/04/23 2353   03/08/23 0500  CBC  Daily,   R      03/07/23 1010   03/08/23 0500  Comprehensive metabolic panel  Daily,   R      03/07/23 1010          Data Reviewed: I have personally reviewed following labs and imaging studies CBC: Recent Labs  Lab 03/04/23 2026 03/05/23 0555 03/08/23 0610  WBC 3.8* 3.7* 5.8  NEUTROABS 2.2  --   --   HGB 14.5 14.3 14.2  HCT 45.5 44.9 44.1  MCV 91.4 91.3 90.7  PLT 186 163 162   Basic Metabolic Panel:  Recent Labs  Lab 03/04/23 2026 03/05/23 0554 03/05/23 0555 03/07/23 0614 03/07/23 1325 03/08/23 0610  NA 139  --  133* 138 137 138  K 3.2*  --  3.1* 2.9* 3.5 3.7  CL 96*  --  94* 97* 97* 101  CO2 29  --  26 29 29 27   GLUCOSE 103*  --  122* 94 121* 95  BUN 7  --  11 20 19 19   CREATININE 1.09*  --  1.02*  1.01* 1.02* 1.10* 0.86  CALCIUM 8.6*  --  8.3* 8.2* 8.4* 8.2*  MG  --  2.0  --  2.1  --   --    GFR: Estimated Creatinine Clearance: 123.2 mL/min (by C-G formula based on SCr of 0.86 mg/dL). Liver Function Tests:  Recent Labs  Lab 03/05/23 0555 03/08/23 0610  AST 44* 43*  ALT 16 40  ALKPHOS 49 41  BILITOT 0.9 0.9  PROT 6.8 5.7*  ALBUMIN 3.2* 2.9*   Recent Labs    03/06/23 1244  HGBA1C 5.6  Sepsis Labs: No results for input(s): "PROCALCITON", "LATICACIDVEN" in the last 168 hours. Recent Results (from the past 240 hours)  Resp panel by RT-PCR (RSV, Flu A&B, Covid) Anterior Nasal Swab     Status: Abnormal   Collection Time: 03/04/23  8:45 PM   Specimen: Anterior Nasal Swab  Result Value Ref Range Status   SARS Coronavirus 2 by RT PCR NEGATIVE NEGATIVE Final   Influenza A by PCR POSITIVE (A) NEGATIVE Final   Influenza B by PCR NEGATIVE NEGATIVE Final    Comment: (NOTE) The Xpert Xpress SARS-CoV-2/FLU/RSV plus assay is intended as an aid in the diagnosis of influenza from  Nasopharyngeal swab specimens and should not be used as a sole basis for treatment. Nasal washings and aspirates are unacceptable for Xpert Xpress SARS-CoV-2/FLU/RSV testing.  Fact Sheet for Patients: BloggerCourse.com  Fact Sheet for Healthcare Providers: SeriousBroker.it  This test is not yet approved or cleared by the [place] FDA and has been authorized for detection and/or diagnosis of SARS-CoV-2 by FDA under an Emergency Use Authorization (EUA). This EUA will remain in effect (meaning this test can be used) for the duration of the COVID-19 declaration under Section 564(b)(1) of the Act, 21 U.S.C. section 360bbb-3(b)(1), unless the authorization is terminated or revoked.     Resp Syncytial Virus by PCR NEGATIVE NEGATIVE Final    Comment: (NOTE) Fact Sheet for Patients: BloggerCourse.com  Fact Sheet for Healthcare Providers: SeriousBroker.it  This test is not yet approved or cleared by the [place] FDA and has been authorized for detection and/or diagnosis of SARS-CoV-2 by FDA under an Emergency Use Authorization (EUA). This EUA will remain in effect (meaning this test can be used) for the duration of the COVID-19 declaration under Section 564(b)(1) of the Act, 21 U.S.C. section 360bbb-3(b)(1), unless the authorization is terminated or revoked.  Performed at [facility], [address], [place], [place] [address]     Antimicrobials/Microbiology: Anti-infectives (From admission, onward)    Start     Dose/Rate Route Frequency Ordered Stop   03/05/23 0000  oseltamivir (TAMIFLU) capsule 75 mg        75 mg Oral 2 times daily 03/04/23 2353 03/09/23 2159   03/05/23 0000  doxycycline (VIBRAMYCIN) 100 mg in sodium chloride 0.9 % 250 mL IVPB        100 mg 125 mL/hr  over 120 Minutes Intravenous Every 12 hours 03/04/23 2353           Component Value Date/Time    SDES BLOOD SITE NOT SPECIFIED 09/30/2019 1801   SPECREQUEST  09/30/2019 1801    BOTTLES DRAWN AEROBIC ONLY Blood Culture adequate volume   CULT  09/30/2019 1801    NO GROWTH 5 DAYS Performed at [facility], [address], [place], [place] [address]    REPTSTATUS 10/05/2019 FINAL 09/30/2019 1801     Radiology Studies: ECHOCARDIOGRAM COMPLETE Result Date: 03/07/2023    ECHOCARDIOGRAM REPORT   Patient Name:   DENIYA CRAIGO Date of Exam: 03/07/2023 Medical Rec #:  [id number]       Height:       64.0 in Accession #:    [id number]      Weight:       430.0 lb Date of Birth:  07-01-1965      BSA:          2.709 m Patient Age:    57 years        BP:           111/74 mmHg Patient Gender: F               HR:           72 bpm. Exam Location:  Inpatient Procedure: 2D Echo, Cardiac Doppler and Color Doppler Indications:    chest pain  History:        Patient has prior history of Echocardiogram examinations, most                 recent 06/29/2016. CHF, COPD and chronic kidney disease; Risk                 Factors:Diabetes, Hypertension and Sleep Apnea.  Sonographer:    Delcie Roch RDCS Referring Phys: [id number] RAMESH KC  Sonographer Comments: Patient is obese. Image acquisition challenging due to patient body habitus. IMPRESSIONS  1. Left ventricular ejection fraction, by estimation, is 55 to 60%. The left ventricle has normal function. The left ventricle has no regional wall motion abnormalities. There is moderate concentric left ventricular hypertrophy. Left ventricular diastolic parameters are consistent with Grade I diastolic dysfunction (impaired relaxation).  2. Right ventricular systolic function is normal. The right ventricular size is normal. Tricuspid regurgitation signal is inadequate for assessing PA pressure.  3. The mitral valve is normal in structure. Trivial mitral valve regurgitation. No evidence of mitral stenosis.  4. The aortic valve is tricuspid. There is mild calcification of the  aortic valve. Aortic valve regurgitation is mild. No aortic stenosis is present.  5. The inferior vena cava is normal in size with greater than 50% respiratory variability, suggesting right atrial pressure of 3 mmHg. FINDINGS  Left Ventricle: Left ventricular ejection fraction, by estimation, is 55 to 60%. The left ventricle has normal function. The left ventricle has no regional wall motion abnormalities. The left ventricular internal cavity size was normal in size. There is  moderate concentric left ventricular hypertrophy. Left ventricular diastolic parameters are consistent with Grade I diastolic dysfunction (impaired relaxation). Right Ventricle: The right ventricular size is normal. No increase in right ventricular wall thickness. Right ventricular systolic function is normal. Tricuspid regurgitation signal is inadequate for assessing PA pressure. Left Atrium: Left atrial size was normal in size. Right Atrium: Right atrial size was normal in size. Pericardium: There is no evidence of pericardial effusion. Mitral Valve: The mitral valve is normal  in structure. Trivial mitral valve regurgitation. No evidence of mitral valve stenosis. Tricuspid Valve: The tricuspid valve is normal in structure. Tricuspid valve regurgitation is not demonstrated. Aortic Valve: The aortic valve is tricuspid. There is mild calcification of the aortic valve. Aortic valve regurgitation is mild. No aortic stenosis is present. Pulmonic Valve: The pulmonic valve was normal in structure. Pulmonic valve regurgitation is not visualized. Aorta: The aortic root is normal in size and structure. Venous: The inferior vena cava is normal in size with greater than 50% respiratory variability, suggesting right atrial pressure of 3 mmHg. IAS/Shunts: No atrial level shunt detected by color flow Doppler.  LEFT VENTRICLE PLAX 2D LVIDd:         4.90 cm   Diastology LVIDs:         2.90 cm   LV e' medial:    6.20 cm/s LV PW:         1.60 cm   LV E/e'  medial:  10.6 LV IVS:        1.50 cm   LV e' lateral:   6.20 cm/s LVOT diam:     2.20 cm   LV E/e' lateral: 10.6 LV SV:         97 LV SV Index:   36 LVOT Area:     3.80 cm  RIGHT VENTRICLE             IVC RV S prime:     13.40 cm/s  IVC diam: 1.90 cm TAPSE (M-mode): 1.9 cm LEFT ATRIUM             Index LA diam:        3.60 cm 1.33 cm/m LA Vol (A2C):   73.2 ml 27.03 ml/m LA Vol (A4C):   63.1 ml 23.30 ml/m LA Biplane Vol: 71.2 ml 26.29 ml/m  AORTIC VALVE LVOT Vmax:   119.00 cm/s LVOT Vmean:  73.500 cm/s LVOT VTI:    0.254 m  AORTA Ao Root diam: 3.30 cm Ao Asc diam:  3.70 cm MITRAL VALVE MV Area (PHT): 2.48 cm    SHUNTS MV Decel Time: 306 msec    Systemic VTI:  0.25 m MV E velocity: 65.90 cm/s  Systemic Diam: 2.20 cm MV A velocity: 64.60 cm/s MV E/A ratio:  1.02 Dalton McleanMD Electronically signed by Wilfred Lacy Signature Date/Time: 03/07/2023/6:08:25 PM    Final    DG Chest Port 1 View Result Date: 03/07/2023 CLINICAL DATA:  COPD with acute exacerbation. EXAM: PORTABLE CHEST 1 VIEW COMPARISON:  March 04, 2023. FINDINGS: Stable cardiomediastinal silhouette. Mild central pulmonary vascular congestion is noted. No definite consolidative process is noted. Bony thorax is unremarkable. IMPRESSION: Mild central pulmonary vascular congestion. Electronically Signed   By: Lupita Raider M.D.   On: 03/07/2023 12:12     LOS: 4 days    Dorcas Carrow, MD  [facility]  03/08/2023, 10:52 AM

## 2023-03-08 NOTE — Progress Notes (Signed)
   03/08/23 2333  BiPAP/CPAP/SIPAP  $ Non-Invasive Ventilator  Non-Invasive Vent Subsequent  BiPAP/CPAP/SIPAP Pt Type Adult  BiPAP/CPAP/SIPAP DREAMSTATIOND  Mask Type Full face mask  Mask Size Medium  Respiratory Rate 20 breaths/min  FiO2 (%) 21 %  Patient Home Equipment No  Auto Titrate Yes

## 2023-03-09 DIAGNOSIS — J1100 Influenza due to unidentified influenza virus with unspecified type of pneumonia: Secondary | ICD-10-CM | POA: Diagnosis not present

## 2023-03-09 DIAGNOSIS — J441 Chronic obstructive pulmonary disease with (acute) exacerbation: Secondary | ICD-10-CM | POA: Diagnosis not present

## 2023-03-09 LAB — CBC
HCT: 43.2 % (ref 36.0–46.0)
Hemoglobin: 13.6 g/dL (ref 12.0–15.0)
MCH: 28.7 pg (ref 26.0–34.0)
MCHC: 31.5 g/dL (ref 30.0–36.0)
MCV: 91.1 fL (ref 80.0–100.0)
Platelets: 167 10*3/uL (ref 150–400)
RBC: 4.74 MIL/uL (ref 3.87–5.11)
RDW: 13.7 % (ref 11.5–15.5)
WBC: 6.6 10*3/uL (ref 4.0–10.5)
nRBC: 0 % (ref 0.0–0.2)

## 2023-03-09 LAB — COMPREHENSIVE METABOLIC PANEL
ALT: 43 U/L (ref 0–44)
AST: 30 U/L (ref 15–41)
Albumin: 2.8 g/dL — ABNORMAL LOW (ref 3.5–5.0)
Alkaline Phosphatase: 41 U/L (ref 38–126)
Anion gap: 8 (ref 5–15)
BUN: 18 mg/dL (ref 6–20)
CO2: 28 mmol/L (ref 22–32)
Calcium: 8.4 mg/dL — ABNORMAL LOW (ref 8.9–10.3)
Chloride: 104 mmol/L (ref 98–111)
Creatinine, Ser: 0.9 mg/dL (ref 0.44–1.00)
GFR, Estimated: >60 mL/min (ref >60)
Glucose, Bld: 97 mg/dL (ref 70–99)
Potassium: 3.5 mmol/L (ref 3.5–5.1)
Sodium: 140 mmol/L (ref 135–145)
Total Bilirubin: 0.8 mg/dL (ref 0.0–1.2)
Total Protein: 6 g/dL — ABNORMAL LOW (ref 6.5–8.1)

## 2023-03-09 LAB — GLUCOSE, CAPILLARY: Glucose-Capillary: 97 mg/dL (ref 70–99)

## 2023-03-09 MED ORDER — ALPRAZOLAM 2 MG PO TABS: 2.0000 mg | ORAL_TABLET | Freq: Every day | ORAL | 0 refills | Status: AC | PRN

## 2023-03-09 MED ORDER — DOXYCYCLINE HYCLATE 100 MG PO TABS: 100.0000 mg | ORAL_TABLET | Freq: Two times a day (BID) | ORAL | 0 refills | Status: AC

## 2023-03-09 MED ORDER — PREDNISONE 20 MG PO TABS: 40.0000 mg | ORAL_TABLET | Freq: Every day | ORAL | 0 refills | Status: AC

## 2023-03-09 NOTE — Progress Notes (Signed)
PT Cancellation Note  Patient Details Name: Jenna Chandler MRN: [id number] DOB: 20-Oct-1965   Cancelled Treatment:    Reason Eval/Treat Not Completed: Patient declined, no reason specified. PT attempted to see this patient for treatment in an effort to expedite potential discharge. Pt is sleeping upon PT arrival. Pt declines mobility at this time, reporting a lack of rest due to being up most of the night grieving the loss of 2 family members. Pt reports she feels comfortable with her mobility at this time and is ready to discharge. PT will continue to follow up with this patient but will likely not return today due to high patient volumes, pt is aware.  PT continues to recommend discharge home, no current PT or DME needs at this time.     Arlyss Gandy 03/09/2023, 8:09 AM

## 2023-03-09 NOTE — Discharge Summary (Signed)
Physician Discharge Summary  SCHERYL SANBORN ZOX:[id number] DOB: February 24, 1965 DOA: 03/04/2023  PCP: Fleet Contras, MD  Admit date: 03/04/2023 Discharge date: 03/09/2023  Admitted From: Home Disposition: Home  Recommendations for Outpatient Follow-up:  Follow up with PCP in 1-2 weeks Please obtain BMP/CBC in one week   Home Health: N/A Equipment/Devices: N/A  Discharge Condition: Stable CODE STATUS: Full code Diet recommendation: Low-salt diet  Discharge summary: 58 year old with history of chronic diastolic congestive heart failure, CKD stage IIIa, COPD, type 2 diabetes, fibromyalgia, sleep apnea on CPAP, morbid obesity presented to the hospital on 1/26 from home with progressive shortness of breath, fever and wheezing not improving despite nebulizer therapy at home.  In the emergency room she was found to be in COPD exacerbation secondary to influenza A infection.  Due to significant symptoms she was admitted to the hospital.  Acute hypoxemic respiratory failure secondary to COPD exacerbation due to influenza A infection: Admitted treated with IV steroids, bronchodilator therapy, respiratory therapy.  She received Tamiflu for 5 days.  She also received doxycycline.  Clinically improved now.  On room air.  Mobilizing around. Patient will go home today, she will complete 3 additional days of prednisone.  She has completed 5 days of Tamiflu today.  Will also prescribe additional doxycycline to complete 7 days of therapy.  Chronic medical issues Stable.  Chronic anxiety disorder: Patient uses Ativan 2 mg/day as needed.  She is out of the prescriptions.  Will provide 10 days of prescription until she is seen by her regular provider.  Sleep apnea: Apparently her CPAP is broken.  She will reconnect with her pulmonologist for new CPAP evaluation.  Stable for discharge.   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   HTN (hypertension)   Morbid obesity ([name])   OSA (obstructive sleep apnea)   Chronic respiratory failure ([name])   Restrictive lung disease   AKI (acute kidney injury) ([name])   Hypokalemia   Influenza A    Discharge Instructions  Discharge Instructions     Diet - low sodium heart healthy   Complete by: As directed    Increase activity slowly   Complete by: As directed       Allergies as of 03/09/2023       Reactions   Lisinopril Cough   Levaquin [levofloxacin]    Nauseated         Medication List     STOP taking these medications    cephALEXin 500 MG capsule Commonly known as: KEFLEX       TAKE these medications    albuterol 108 (90 Base) MCG/ACT inhaler Commonly known as: VENTOLIN HFA Inhale 2 puffs into the lungs every 6 (six) hours as needed. wheezing   albuterol (2.5 MG/3ML) 0.083% nebulizer solution Commonly known as: PROVENTIL Take 3 mLs (2.5 mg total) by nebulization every 6 (six) hours as needed for wheezing or shortness of breath.   alprazolam 2 MG tablet Commonly known as: XANAX Take 1 tablet (2 mg total) by mouth daily as needed for up to 10 days.   amLODipine 10 MG tablet Commonly known as: NORVASC TAKE 1 TABLET(10 MG) BY MOUTH TWICE DAILY   aspirin 325 MG tablet Take 325 mg by mouth as needed for mild pain (pain score 1-3) or moderate pain (pain score 4-6).   cloNIDine 0.2 MG tablet Commonly known as:  CATAPRES Take 1 tablet (0.2 mg total) by mouth 2 (two) times daily.   doxycycline 100 MG tablet Commonly known as: VIBRA-TABS Take 1 tablet (100 mg total) by mouth every 12 (twelve) hours for 4 days.   losartan-hydrochlorothiazide 100-25 MG tablet Commonly known as: HYZAAR Take 1 tablet by mouth daily.   Mounjaro 5 MG/0.5ML Pen Generic drug: tirzepatide Inject 5 mg into the skin once a week. Inject on Friday   MULTIVITAMIN WOMEN 50+ PO Take 1 tablet by mouth daily.    naloxone 4 MG/0.1ML Liqd nasal spray kit Commonly known as: Narcan Place 1 spray into the nose as needed.   naproxen sodium 220 MG tablet Commonly known as: ALEVE Take 440 mg by mouth daily as needed.   nystatin powder Commonly known as: MYCOSTATIN/NYSTOP Apply to affected area 3 times per day.   Potassium Chloride ER 20 MEQ Tbcr Take 20 mEq by mouth daily as needed (potassium deficiency).   predniSONE 20 MG tablet Commonly known as: DELTASONE Take 2 tablets (40 mg total) by mouth daily with breakfast for 3 days.   REFRESH DRY EYE THERAPY OP Apply 1-2 drops to eye as needed (dry eye).   sodium chloride 0.65 % Soln nasal spray Commonly known as: OCEAN Place 1 spray into both nostrils daily.   tiZANidine 4 MG tablet Commonly known as: ZANAFLEX Take 4 mg by mouth 2 (two) times daily.   traZODone 100 MG tablet Commonly known as: DESYREL TAKE 1 TABLET(100 MG) BY MOUTH AT BEDTIME AS NEEDED FOR SLEEP   Vitamin D (Ergocalciferol) 1.25 MG (50000 UNIT) Caps capsule Commonly known as: DRISDOL Take 50,000 Units by mouth once a week. Mondays        Allergies  Allergen Reactions   Lisinopril Cough   Levaquin [Levofloxacin]     Nauseated     Consultations: None   Procedures/Studies: ECHOCARDIOGRAM COMPLETE Result Date: 03/07/2023    ECHOCARDIOGRAM REPORT   Patient Name:   CJ BEECHER Date of Exam: 03/07/2023 Medical Rec #:  [id number]       Height:       64.0 in Accession #:    [id number]      Weight:       430.0 lb Date of Birth:  [birth year]      BSA:          2.709 m Patient Age:    58 years        BP:           111/74 mmHg Patient Gender: F               HR:           72 bpm. Exam Location:  Inpatient Procedure: 2D Echo, Cardiac Doppler and Color Doppler Indications:    chest pain  History:        Patient has prior history of Echocardiogram examinations, most                 recent 06/29/2016. CHF, COPD and chronic kidney disease; Risk                 Factors:Diabetes,  Hypertension and Sleep Apnea.  Sonographer:    Delcie Roch RDCS Referring Phys: [id number] RAMESH KC  Sonographer Comments: Patient is obese. Image acquisition challenging due to patient body habitus. IMPRESSIONS  1. Left ventricular ejection fraction, by estimation, is 55 to 60%. The left ventricle has normal function. The left ventricle has no regional wall motion abnormalities. There  is moderate concentric left ventricular hypertrophy. Left ventricular diastolic parameters are consistent with Grade I diastolic dysfunction (impaired relaxation).  2. Right ventricular systolic function is normal. The right ventricular size is normal. Tricuspid regurgitation signal is inadequate for assessing PA pressure.  3. The mitral valve is normal in structure. Trivial mitral valve regurgitation. No evidence of mitral stenosis.  4. The aortic valve is tricuspid. There is mild calcification of the aortic valve. Aortic valve regurgitation is mild. No aortic stenosis is present.  5. The inferior vena cava is normal in size with greater than 50% respiratory variability, suggesting right atrial pressure of 3 mmHg. FINDINGS  Left Ventricle: Left ventricular ejection fraction, by estimation, is 55 to 60%. The left ventricle has normal function. The left ventricle has no regional wall motion abnormalities. The left ventricular internal cavity size was normal in size. There is  moderate concentric left ventricular hypertrophy. Left ventricular diastolic parameters are consistent with Grade I diastolic dysfunction (impaired relaxation). Right Ventricle: The right ventricular size is normal. No increase in right ventricular wall thickness. Right ventricular systolic function is normal. Tricuspid regurgitation signal is inadequate for assessing PA pressure. Left Atrium: Left atrial size was normal in size. Right Atrium: Right atrial size was normal in size. Pericardium: There is no evidence of pericardial effusion. Mitral Valve: The  mitral valve is normal in structure. Trivial mitral valve regurgitation. No evidence of mitral valve stenosis. Tricuspid Valve: The tricuspid valve is normal in structure. Tricuspid valve regurgitation is not demonstrated. Aortic Valve: The aortic valve is tricuspid. There is mild calcification of the aortic valve. Aortic valve regurgitation is mild. No aortic stenosis is present. Pulmonic Valve: The pulmonic valve was normal in structure. Pulmonic valve regurgitation is not visualized. Aorta: The aortic root is normal in size and structure. Venous: The inferior vena cava is normal in size with greater than 50% respiratory variability, suggesting right atrial pressure of 3 mmHg. IAS/Shunts: No atrial level shunt detected by color flow Doppler.  LEFT VENTRICLE PLAX 2D LVIDd:         4.90 cm   Diastology LVIDs:         2.90 cm   LV e' medial:    6.20 cm/s LV PW:         1.60 cm   LV E/e' medial:  10.6 LV IVS:        1.50 cm   LV e' lateral:   6.20 cm/s LVOT diam:     2.20 cm   LV E/e' lateral: 10.6 LV SV:         97 LV SV Index:   36 LVOT Area:     3.80 cm  RIGHT VENTRICLE             IVC RV S prime:     13.40 cm/s  IVC diam: 1.90 cm TAPSE (M-mode): 1.9 cm LEFT ATRIUM             Index LA diam:        3.60 cm 1.33 cm/m LA Vol (A2C):   73.2 ml 27.03 ml/m LA Vol (A4C):   63.1 ml 23.30 ml/m LA Biplane Vol: 71.2 ml 26.29 ml/m  AORTIC VALVE LVOT Vmax:   119.00 cm/s LVOT Vmean:  73.500 cm/s LVOT VTI:    0.254 m  AORTA Ao Root diam: 3.30 cm Ao Asc diam:  3.70 cm MITRAL VALVE MV Area (PHT): 2.48 cm    SHUNTS MV Decel Time: 306 msec    Systemic  VTI:  0.25 m MV E velocity: 65.90 cm/s  Systemic Diam: 2.20 cm MV A velocity: 64.60 cm/s MV E/A ratio:  1.02 Dalton McleanMD Electronically signed by Wilfred Lacy Signature Date/Time: 03/07/2023/6:08:25 PM    Final    DG Chest Port 1 View Result Date: 03/07/2023 CLINICAL DATA:  COPD with acute exacerbation. EXAM: PORTABLE CHEST 1 VIEW COMPARISON:  March 04, 2023. FINDINGS:  Stable cardiomediastinal silhouette. Mild central pulmonary vascular congestion is noted. No definite consolidative process is noted. Bony thorax is unremarkable. IMPRESSION: Mild central pulmonary vascular congestion. Electronically Signed   By: Lupita Raider M.D.   On: 03/07/2023 12:12   DG Chest Port 1 View Result Date: 03/04/2023 CLINICAL DATA:  SOB EXAM: PORTABLE CHEST 1 VIEW COMPARISON:  Chest x-ray 08/22/2021 FINDINGS: Limited evaluation due to overlapping osseous structures and overlying soft tissues. The heart and mediastinal contours are unchanged. No focal consolidation. No pulmonary edema. No pleural effusion. No pneumothorax. No acute osseous abnormality. Degenerative changes of the shoulders. IMPRESSION: No active disease. Electronically Signed   By: Tish Frederickson M.D.   On: 03/04/2023 21:51   DG Foot Complete Left Result Date: 03/04/2023 CLINICAL DATA:  Shortness of breath, left foot pain. EXAM: LEFT FOOT - COMPLETE 3+ VIEW COMPARISON:  None Available. FINDINGS: There is no evidence of fracture or dislocation. Joint spaces are maintained. There is osteophyte formation over the dorsal midfoot compatible with mild degenerative change. There is soft tissue swelling over the dorsum of the foot. IMPRESSION: 1. No acute fracture or dislocation. 2. Soft tissue swelling over the dorsum of the foot. Electronically Signed   By: Darliss Cheney M.D.   On: 03/04/2023 21:43   (Echo, Carotid, EGD, Colonoscopy, ERCP)    Subjective: Patient seen and examined.  Breathing is better but she has been anxious today , bereaving as she has 2 family members died last night.  Feels comfortable going home.   Discharge Exam: Vitals:   03/09/23 0735 03/09/23 0759  BP:  (!) 137/90  Pulse:  65  Resp:    Temp:  98.7 F (37.1 C)  SpO2: 94% 96%   Vitals:   03/08/23 1956 03/09/23 0544 03/09/23 0735 03/09/23 0759  BP: (!) 149/77 (!) 141/86  (!) 137/90  Pulse: 73 70  65  Resp: 18 18    Temp: 98.4 F (36.9  C) 98 F (36.7 C)  98.7 F (37.1 C)  TempSrc: Oral     SpO2: 100% 94% 94% 96%  Weight:      Height:        General: Pt is alert, awake, not in acute distress Cardiovascular: RRR, S1/S2 +, no rubs, no gallops Respiratory: CTA bilaterally, no wheezing, no rhonchi, on room air. Abdominal: Soft, NT, ND, bowel sounds +, obese and pendulous. Extremities: no edema, no cyanosis    The results of significant diagnostics from this hospitalization (including imaging, microbiology, ancillary and laboratory) are listed below for reference.     Microbiology: Recent Results (from the past 240 hours)  Resp panel by RT-PCR (RSV, Flu A&B, Covid) Anterior Nasal Swab     Status: Abnormal   Collection Time: 03/04/23  8:45 PM   Specimen: Anterior Nasal Swab  Result Value Ref Range Status   SARS Coronavirus 2 by RT PCR NEGATIVE NEGATIVE Final   Influenza A by PCR POSITIVE (A) NEGATIVE Final   Influenza B by PCR NEGATIVE NEGATIVE Final    Comment: (NOTE) The Xpert Xpress SARS-CoV-2/FLU/RSV plus assay is intended as an  aid in the diagnosis of influenza from Nasopharyngeal swab specimens and should not be used as a sole basis for treatment. Nasal washings and aspirates are unacceptable for Xpert Xpress SARS-CoV-2/FLU/RSV testing.  Fact Sheet for Patients: BloggerCourse.com  Fact Sheet for Healthcare Providers: SeriousBroker.it  This test is not yet approved or cleared by the [place] FDA and has been authorized for detection and/or diagnosis of SARS-CoV-2 by FDA under an Emergency Use Authorization (EUA). This EUA will remain in effect (meaning this test can be used) for the duration of the COVID-19 declaration under Section 564(b)(1) of the Act, 21 U.S.C. section 360bbb-3(b)(1), unless the authorization is terminated or revoked.     Resp Syncytial Virus by PCR NEGATIVE NEGATIVE Final    Comment: (NOTE) Fact Sheet for  Patients: BloggerCourse.com  Fact Sheet for Healthcare Providers: SeriousBroker.it  This test is not yet approved or cleared by the [place] FDA and has been authorized for detection and/or diagnosis of SARS-CoV-2 by FDA under an Emergency Use Authorization (EUA). This EUA will remain in effect (meaning this test can be used) for the duration of the COVID-19 declaration under Section 564(b)(1) of the Act, 21 U.S.C. section 360bbb-3(b)(1), unless the authorization is terminated or revoked.  Performed at [facility], [address] [place], [place] [address]      Labs: BNP (last 3 results) Recent Labs    03/07/23 1156  BNP 25.2   Basic Metabolic Panel: Recent Labs  Lab 03/05/23 0554 03/05/23 0555 03/07/23 0614 03/07/23 1325 03/08/23 0610 03/09/23 0719  NA  --  133* 138 137 138 140  K  --  3.1* 2.9* 3.5 3.7 3.5  CL  --  94* 97* 97* 101 104  CO2  --  26 29 29 27 28   GLUCOSE  --  122* 94 121* 95 97  BUN  --  11 20 19 19 18   CREATININE  --  1.02*  1.01* 1.02* 1.10* 0.86 0.90  CALCIUM  --  8.3* 8.2* 8.4* 8.2* 8.4*  MG 2.0  --  2.1  --   --   --    Liver Function Tests: Recent Labs  Lab 03/05/23 0555 03/08/23 0610 03/09/23 0719  AST 44* 43* 30  ALT 16 40 43  ALKPHOS 49 41 41  BILITOT 0.9 0.9 0.8  PROT 6.8 5.7* 6.0*  ALBUMIN 3.2* 2.9* 2.8*   No results for input(s): "LIPASE", "AMYLASE" in the last 168 hours. No results for input(s): "AMMONIA" in the last 168 hours. CBC: Recent Labs  Lab 03/04/23 2026 03/05/23 0555 03/08/23 0610 03/09/23 0719  WBC 3.8* 3.7* 5.8 6.6  NEUTROABS 2.2  --   --   --   HGB 14.5 14.3 14.2 13.6  HCT 45.5 44.9 44.1 43.2  MCV 91.4 91.3 90.7 91.1  PLT 186 163 162 167   Cardiac Enzymes: No results for input(s): "CKTOTAL", "CKMB", "CKMBINDEX", "TROPONINI" in the last 168 hours. BNP: Invalid input(s): "POCBNP" CBG: Recent Labs  Lab 03/08/23 0847 03/08/23 1225  03/08/23 1646 03/08/23 1955 03/09/23 0759  GLUCAP 104* 156* 135* 115* 97   D-Dimer No results for input(s): "DDIMER" in the last 72 hours. Hgb A1c Recent Labs    03/06/23 1244  HGBA1C 5.6   Lipid Profile No results for input(s): "CHOL", "HDL", "LDLCALC", "TRIG", "CHOLHDL", "LDLDIRECT" in the last 72 hours. Thyroid function studies No results for input(s): "TSH", "T4TOTAL", "T3FREE", "THYROIDAB" in the last 72 hours.  Invalid input(s): "FREET3" Anemia work up No results  for input(s): "VITAMINB12", "FOLATE", "FERRITIN", "TIBC", "IRON", "RETICCTPCT" in the last 72 hours. Urinalysis    Component Value Date/Time   COLORURINE YELLOW 03/05/2023 0541   APPEARANCEUR CLEAR 03/05/2023 0541   LABSPEC 1.013 03/05/2023 0541   PHURINE 7.0 03/05/2023 0541   GLUCOSEU NEGATIVE 03/05/2023 0541   HGBUR SMALL (A) 03/05/2023 0541   BILIRUBINUR NEGATIVE 03/05/2023 0541   KETONESUR NEGATIVE 03/05/2023 0541   PROTEINUR 100 (A) 03/05/2023 0541   UROBILINOGEN 0.2 03/19/2013 2014   NITRITE NEGATIVE 03/05/2023 0541   LEUKOCYTESUR NEGATIVE 03/05/2023 0541   Sepsis Labs Recent Labs  Lab 03/04/23 2026 03/05/23 0555 03/08/23 0610 03/09/23 0719  WBC 3.8* 3.7* 5.8 6.6   Microbiology Recent Results (from the past 240 hours)  Resp panel by RT-PCR (RSV, Flu A&B, Covid) Anterior Nasal Swab     Status: Abnormal   Collection Time: 03/04/23  8:45 PM   Specimen: Anterior Nasal Swab  Result Value Ref Range Status   SARS Coronavirus 2 by RT PCR NEGATIVE NEGATIVE Final   Influenza A by PCR POSITIVE (A) NEGATIVE Final   Influenza B by PCR NEGATIVE NEGATIVE Final    Comment: (NOTE) The Xpert Xpress SARS-CoV-2/FLU/RSV plus assay is intended as an aid in the diagnosis of influenza from Nasopharyngeal swab specimens and should not be used as a sole basis for treatment. Nasal washings and aspirates are unacceptable for Xpert Xpress SARS-CoV-2/FLU/RSV testing.  Fact Sheet for  Patients: BloggerCourse.com  Fact Sheet for Healthcare Providers: SeriousBroker.it  This test is not yet approved or cleared by the [place] FDA and has been authorized for detection and/or diagnosis of SARS-CoV-2 by FDA under an Emergency Use Authorization (EUA). This EUA will remain in effect (meaning this test can be used) for the duration of the COVID-19 declaration under Section 564(b)(1) of the Act, 21 U.S.C. section 360bbb-3(b)(1), unless the authorization is terminated or revoked.     Resp Syncytial Virus by PCR NEGATIVE NEGATIVE Final    Comment: (NOTE) Fact Sheet for Patients: BloggerCourse.com  Fact Sheet for Healthcare Providers: SeriousBroker.it  This test is not yet approved or cleared by the [place] FDA and has been authorized for detection and/or diagnosis of SARS-CoV-2 by FDA under an Emergency Use Authorization (EUA). This EUA will remain in effect (meaning this test can be used) for the duration of the COVID-19 declaration under Section 564(b)(1) of the Act, 21 U.S.C. section 360bbb-3(b)(1), unless the authorization is terminated or revoked.  Performed at [facility], [address] [place], [place] [address]      Time coordinating discharge: 35 minutes  SIGNED:   Dorcas Carrow, MD  [facility] 03/09/2023, 10:23 AM

## 2023-03-16 ENCOUNTER — Ambulatory Visit: Payer: Medicaid Other | Admitting: Cardiology

## 2023-05-17 ENCOUNTER — Ambulatory Visit: Payer: Medicaid Other | Admitting: Cardiology

## 2023-09-11 ENCOUNTER — Other Ambulatory Visit: Payer: Self-pay

## 2023-09-11 DIAGNOSIS — I872 Venous insufficiency (chronic) (peripheral): Secondary | ICD-10-CM

## 2023-09-26 ENCOUNTER — Ambulatory Visit: Admitting: Cardiology

## 2023-10-11 ENCOUNTER — Encounter (HOSPITAL_COMMUNITY)

## 2023-10-11 ENCOUNTER — Encounter

## 2023-11-05 ENCOUNTER — Other Ambulatory Visit: Payer: Self-pay | Admitting: Internal Medicine

## 2023-11-05 DIAGNOSIS — Z1231 Encounter for screening mammogram for malignant neoplasm of breast: Secondary | ICD-10-CM

## 2023-11-13 ENCOUNTER — Ambulatory Visit

## 2023-12-26 ENCOUNTER — Other Ambulatory Visit (HOSPITAL_COMMUNITY): Payer: Self-pay

## 2024-01-01 ENCOUNTER — Ambulatory Visit: Admitting: Cardiology

## 2024-02-05 ENCOUNTER — Encounter (HOSPITAL_COMMUNITY): Payer: Self-pay

## 2024-03-11 ENCOUNTER — Ambulatory Visit: Admitting: Cardiology

## 2024-05-14 ENCOUNTER — Ambulatory Visit: Admitting: Cardiology

## 2024-06-17 ENCOUNTER — Ambulatory Visit (HOSPITAL_COMMUNITY)

## 2024-06-17 ENCOUNTER — Encounter: Admitting: Vascular Surgery
